# Patient Record
Sex: Female | Born: 1990
Health system: Southern US, Community
[De-identification: ages and names within clinical notes are randomized; demographics above are authoritative.]

## PROBLEM LIST (undated history)

## (undated) ENCOUNTER — Inpatient Hospital Stay: Payer: Self-pay

## (undated) DIAGNOSIS — N390 Urinary tract infection, site not specified: Secondary | ICD-10-CM

## (undated) DIAGNOSIS — J45909 Unspecified asthma, uncomplicated: Secondary | ICD-10-CM

## (undated) DIAGNOSIS — G43909 Migraine, unspecified, not intractable, without status migrainosus: Secondary | ICD-10-CM

## (undated) DIAGNOSIS — E079 Disorder of thyroid, unspecified: Secondary | ICD-10-CM

## (undated) DIAGNOSIS — L309 Dermatitis, unspecified: Secondary | ICD-10-CM

## (undated) DIAGNOSIS — Z62810 Personal history of physical and sexual abuse in childhood: Secondary | ICD-10-CM

## (undated) DIAGNOSIS — Q249 Congenital malformation of heart, unspecified: Secondary | ICD-10-CM

## (undated) DIAGNOSIS — B019 Varicella without complication: Secondary | ICD-10-CM

## (undated) DIAGNOSIS — R55 Syncope and collapse: Secondary | ICD-10-CM

## (undated) DIAGNOSIS — F32A Depression, unspecified: Secondary | ICD-10-CM

## (undated) DIAGNOSIS — R51 Headache: Secondary | ICD-10-CM

## (undated) DIAGNOSIS — F509 Eating disorder, unspecified: Secondary | ICD-10-CM

## (undated) DIAGNOSIS — F419 Anxiety disorder, unspecified: Secondary | ICD-10-CM

## (undated) DIAGNOSIS — R519 Headache, unspecified: Secondary | ICD-10-CM

## (undated) DIAGNOSIS — L509 Urticaria, unspecified: Secondary | ICD-10-CM

## (undated) DIAGNOSIS — F329 Major depressive disorder, single episode, unspecified: Secondary | ICD-10-CM

## (undated) HISTORY — DX: Urinary tract infection, site not specified: N39.0

## (undated) HISTORY — DX: Syncope and collapse: R55

## (undated) HISTORY — DX: Personal history of physical and sexual abuse in childhood: Z62.810

## (undated) HISTORY — DX: Varicella without complication: B01.9

## (undated) HISTORY — PX: TOTAL THYROIDECTOMY: SHX2547

## (undated) HISTORY — DX: Urticaria, unspecified: L50.9

## (undated) HISTORY — DX: Depression, unspecified: F32.A

## (undated) HISTORY — DX: Congenital malformation of heart, unspecified: Q24.9

## (undated) HISTORY — DX: Migraine, unspecified, not intractable, without status migrainosus: G43.909

## (undated) HISTORY — DX: Headache, unspecified: R51.9

## (undated) HISTORY — DX: Unspecified asthma, uncomplicated: J45.909

## (undated) HISTORY — DX: Disorder of thyroid, unspecified: E07.9

## (undated) HISTORY — DX: Dermatitis, unspecified: L30.9

## (undated) HISTORY — DX: Anxiety disorder, unspecified: F41.9

## (undated) HISTORY — DX: Headache: R51

## (undated) HISTORY — DX: Major depressive disorder, single episode, unspecified: F32.9

## (undated) HISTORY — DX: Eating disorder, unspecified: F50.9

---
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
## 2011-08-15 ENCOUNTER — Emergency Department: Payer: Self-pay | Admitting: Internal Medicine

## 2011-08-15 LAB — URINALYSIS, COMPLETE
Bacteria: NONE SEEN
Bilirubin,UR: NEGATIVE
Blood: NEGATIVE
Glucose,UR: NEGATIVE mg/dL (ref 0–75)
Ketone: NEGATIVE
Ph: 7 (ref 4.5–8.0)
Protein: NEGATIVE
RBC,UR: <1 /HPF (ref 0–5)
Specific Gravity: 1.006 (ref 1.003–1.030)
Squamous Epithelial: <1
WBC UR: <1 /HPF (ref 0–5)

## 2011-08-15 LAB — CBC WITH DIFFERENTIAL/PLATELET
Basophil #: 0 10*3/uL (ref 0.0–0.1)
Basophil %: 0.2 %
Eosinophil #: 0 10*3/uL (ref 0.0–0.7)
Eosinophil %: 0.2 %
HCT: 42.6 % (ref 35.0–47.0)
HGB: 14.4 g/dL (ref 12.0–16.0)
Lymphocyte #: 0.5 10*3/uL — ABNORMAL LOW (ref 1.0–3.6)
Lymphocyte %: 9.3 %
MCHC: 33.8 g/dL (ref 32.0–36.0)
Monocyte #: 0.1 10*3/uL (ref 0.0–0.7)
Monocyte %: 1.7 %
Neutrophil #: 4.7 10*3/uL (ref 1.4–6.5)
Neutrophil %: 88.6 %
Platelet: 219 10*3/uL (ref 150–440)
RBC: 4.57 10*6/uL (ref 3.80–5.20)
WBC: 5.3 10*3/uL (ref 3.6–11.0)

## 2011-08-15 LAB — COMPREHENSIVE METABOLIC PANEL
Albumin: 4 g/dL (ref 3.8–5.6)
Alkaline Phosphatase: 121 U/L (ref 82–169)
Anion Gap: 10 (ref 7–16)
BUN: 8 mg/dL (ref 7–18)
Bilirubin,Total: 0.3 mg/dL (ref 0.2–1.0)
Calcium, Total: 8.7 mg/dL — ABNORMAL LOW (ref 9.0–10.7)
Chloride: 107 mmol/L (ref 98–107)
Co2: 25 mmol/L (ref 21–32)
EGFR (African American): >60
Glucose: 109 mg/dL — ABNORMAL HIGH (ref 65–99)
Osmolality: 282 (ref 275–301)
SGPT (ALT): 30 U/L
Total Protein: 7.6 g/dL (ref 6.4–8.6)

## 2011-08-15 LAB — HCG, QUANTITATIVE, PREGNANCY: Beta Hcg, Quant.: <1 m[IU]/mL — ABNORMAL LOW

## 2011-08-15 LAB — LIPASE, BLOOD: Lipase: 119 U/L (ref 73–393)

## 2011-09-16 ENCOUNTER — Other Ambulatory Visit: Payer: Self-pay | Admitting: Physician Assistant

## 2011-09-16 LAB — HCG, QUANTITATIVE, PREGNANCY: Beta Hcg, Quant.: <1 m[IU]/mL — ABNORMAL LOW

## 2012-04-29 ENCOUNTER — Ambulatory Visit: Payer: Self-pay | Admitting: Family Medicine

## 2012-08-12 ENCOUNTER — Other Ambulatory Visit: Payer: Self-pay | Admitting: Physician Assistant

## 2012-08-12 LAB — CBC WITH DIFFERENTIAL/PLATELET
Basophil #: 0 10*3/uL (ref 0.0–0.1)
Basophil %: 0.4 %
Eosinophil #: 0.1 10*3/uL (ref 0.0–0.7)
Eosinophil %: 1 %
HCT: 41.5 % (ref 35.0–47.0)
HGB: 14 g/dL (ref 12.0–16.0)
Lymphocyte #: 0.6 10*3/uL — ABNORMAL LOW (ref 1.0–3.6)
Lymphocyte %: 10.9 %
MCH: 31.2 pg (ref 26.0–34.0)
MCHC: 33.7 g/dL (ref 32.0–36.0)
MCV: 93 fL (ref 80–100)
Monocyte #: 0.3 x10 3/mm (ref 0.2–0.9)
Monocyte %: 6 %
Neutrophil #: 4.7 10*3/uL (ref 1.4–6.5)
Neutrophil %: 81.7 %
Platelet: 205 10*3/uL (ref 150–440)
RBC: 4.48 10*6/uL (ref 3.80–5.20)
RDW: 12.7 % (ref 11.5–14.5)
WBC: 5.8 10*3/uL (ref 3.6–11.0)

## 2012-08-12 LAB — COMPREHENSIVE METABOLIC PANEL
Albumin: 4 g/dL (ref 3.4–5.0)
Alkaline Phosphatase: 92 U/L (ref 50–136)
Anion Gap: 5 — ABNORMAL LOW (ref 7–16)
BUN: 6 mg/dL — ABNORMAL LOW (ref 7–18)
Bilirubin,Total: 0.4 mg/dL (ref 0.2–1.0)
Calcium, Total: 8.7 mg/dL (ref 8.5–10.1)
Chloride: 109 mmol/L — ABNORMAL HIGH (ref 98–107)
Co2: 26 mmol/L (ref 21–32)
Creatinine: 0.79 mg/dL (ref 0.60–1.30)
EGFR (African American): >60
EGFR (Non-African Amer.): >60
Glucose: 117 mg/dL — ABNORMAL HIGH (ref 65–99)
Osmolality: 278 (ref 275–301)
Potassium: 3.9 mmol/L (ref 3.5–5.1)
SGOT(AST): 17 U/L (ref 15–37)
SGPT (ALT): 20 U/L (ref 12–78)
Sodium: 140 mmol/L (ref 136–145)
Total Protein: 7.1 g/dL (ref 6.4–8.2)

## 2012-08-12 LAB — HCG, QUANTITATIVE, PREGNANCY: Beta Hcg, Quant.: <1 m[IU]/mL — ABNORMAL LOW

## 2012-08-12 LAB — T4, FREE: Free Thyroxine: 0.99 ng/dL (ref 0.76–1.46)

## 2012-08-12 LAB — TSH: Thyroid Stimulating Horm: 2.39 u[IU]/mL

## 2012-09-10 ENCOUNTER — Encounter: Payer: Self-pay | Admitting: Adult Health

## 2012-09-10 ENCOUNTER — Ambulatory Visit (INDEPENDENT_AMBULATORY_CARE_PROVIDER_SITE_OTHER): Payer: 59 | Admitting: Adult Health

## 2012-09-10 VITALS — BP 127/87 | HR 103 | Temp 99.2°F | Resp 16 | Ht 63.0 in | Wt 119.0 lb

## 2012-09-10 DIAGNOSIS — L659 Nonscarring hair loss, unspecified: Secondary | ICD-10-CM | POA: Insufficient documentation

## 2012-09-10 DIAGNOSIS — F411 Generalized anxiety disorder: Secondary | ICD-10-CM

## 2012-09-10 DIAGNOSIS — R5381 Other malaise: Secondary | ICD-10-CM

## 2012-09-10 DIAGNOSIS — G43909 Migraine, unspecified, not intractable, without status migrainosus: Secondary | ICD-10-CM | POA: Insufficient documentation

## 2012-09-10 DIAGNOSIS — F32A Depression, unspecified: Secondary | ICD-10-CM | POA: Insufficient documentation

## 2012-09-10 DIAGNOSIS — R5383 Other fatigue: Secondary | ICD-10-CM | POA: Insufficient documentation

## 2012-09-10 DIAGNOSIS — Z0000 Encounter for general adult medical examination without abnormal findings: Secondary | ICD-10-CM | POA: Insufficient documentation

## 2012-09-10 DIAGNOSIS — F419 Anxiety disorder, unspecified: Secondary | ICD-10-CM

## 2012-09-10 NOTE — Patient Instructions (Addendum)
   Thank you for choosing [facility] for your health care needs.  Please have your labs drawn prior to leaving the office:   cbc w/ diff  TSH  cmet  Lipid profile  B12  One your results are available we will notify you.  Remember to activate your MyChart. The activation code is at the end of this form.

## 2012-09-10 NOTE — Progress Notes (Signed)
Subjective:    Patient ID: Susan Adkins, female    DOB: Nov 14, 1990, 22 y.o.   MRN: [id number]  HPI  Patient is a 22 year old female who presents to clinic to establish care. She was previously followed by Dr. Sherrie Mustache at [facility] family practice. Patient has a history of paroxysmal tachycardia with heart rate between 140 and 160. She reports that she has previously been evaluated for this; however, she is currently not under any treatment. She also has a history of migraine headaches for which she takes amitriptyline at bedtime and a history of anxiety. More recently, patient is concerned that she is losing a considerable amounts of hair.   Past Medical History  Diagnosis Date  . Asthma     Well controlled.   . Chicken pox   . Fainting episodes   . Migraines     Well controlled on amitriptyline   . UTI (lower urinary tract infection)     Past Surgical History  Procedure Laterality Date  . Tonsillectomy  2005    Family History  Problem Relation Age of Onset  . Interstitial cystitis Mother   . Depression Mother   . Anxiety disorder Mother   . Bipolar disorder Mother   . Hypertension Father   . Cancer Maternal Grandmother 2    Breast cancer  . Depression Maternal Grandmother   . Hypertension Paternal Grandmother      History   Social History  . Marital Status: Single    Spouse Name: Barron Schmid    Number of Children: 0  . Years of Education: 12   Occupational History  . [employer]    Social History Main Topics  . Smoking status: Never Smoker   . Smokeless tobacco: Never Used  . Alcohol Use: 0.5 oz/week    1 drink(s) per week  . Drug Use: No  . Sexually Active: Yes -- Female partner(s)   Other Topics Concern  . Not on file   Social History Narrative  . No narrative on file     Health Maintenance:  Tdap - 2012  Flu shot - 03/2012  PAP - 07/06/12 - Abnormal  Mammography - N/A  Colonoscopy - N/A  Labs - Needs: cbc w/diff, cmet,  TSH, lipids, b12  Depression Screen - No feelings of hopelessness, anhedonia  Tobacco Use - Never  Dental Exams - Every 6 months  Vision Exam - Yearly  Exercise - Run at least 3 days/week  Diet - Ovo Vegetarian    Review of Systems  Constitutional: Positive for appetite change and fatigue.       Decreased appetite  Eyes: Negative.   Respiratory: Negative.   Cardiovascular: Negative.   Gastrointestinal: Positive for abdominal pain and constipation. Negative for nausea, vomiting, diarrhea and blood in stool.       Bloating; Lower abdominal pain  Endocrine: Positive for cold intolerance and heat intolerance. Negative for polydipsia, polyphagia and polyuria.  Genitourinary: Positive for menstrual problem. Negative for dysuria, urgency, frequency, hematuria, difficulty urinating, genital sores and dyspareunia.       Past 6 months - 2 periods  Musculoskeletal: Negative.   Skin: Negative.   Allergic/Immunologic: Positive for environmental allergies.       Dust allergies  Neurological: Positive for dizziness, light-headedness and headaches. Negative for tremors, seizures, syncope, weakness and numbness.       Hx of migraines; lightheaded occasional with changing positions  Hematological: Negative for adenopathy. Does not bruise/bleed easily.  Psychiatric/Behavioral: Negative for suicidal ideas, behavioral  problems, sleep disturbance, self-injury and agitation. The patient is nervous/anxious.     BP 127/87  Pulse 103  Temp(Src) 99.2 F (37.3 C) (Oral)  Resp 16  Ht 5\' 3"  (1.6 m)  Wt 119 lb (53.978 kg)  BMI 21.09 kg/m2  SpO2 100%  LMP 08/15/2012     Objective:   Physical Exam  Constitutional: She is oriented to person, place, and time. She appears well-developed and well-nourished. No distress.  HENT:  Head: Normocephalic and atraumatic.  Right Ear: External ear normal.  Left Ear: External ear normal.  Nose: Nose normal.  Mouth/Throat: Oropharynx is clear and moist.   Eyes: Conjunctivae and EOM are normal. Pupils are equal, round, and reactive to light.  Neck: Normal range of motion. Neck supple. No tracheal deviation present. No thyromegaly present.  Cardiovascular: Regular rhythm, S1 normal, S2 normal and intact distal pulses.  Tachycardia present.  PMI is not displaced.  Exam reveals no gallop.   No murmur heard. Pulmonary/Chest: Effort normal and breath sounds normal. No respiratory distress. She has no wheezes. She has no rales. She exhibits no tenderness.  Abdominal: Soft. Bowel sounds are normal. She exhibits no distension and no mass. There is no tenderness. There is no rebound and no guarding.  Musculoskeletal: Normal range of motion. She exhibits no edema and no tenderness.  Lymphadenopathy:    She has no cervical adenopathy.  Neurological: She is alert and oriented to person, place, and time. She has normal reflexes. No cranial nerve deficit. Coordination normal.  Skin: Skin is warm and dry.  Unusual amounts of hair loss noted on her clothing.  Psychiatric: She has a normal mood and affect. Her behavior is normal. Judgment and thought content normal.       Assessment & Plan:

## 2012-09-11 ENCOUNTER — Encounter: Payer: Self-pay | Admitting: General Practice

## 2012-09-11 ENCOUNTER — Encounter: Payer: Self-pay | Admitting: Adult Health

## 2012-09-11 LAB — COMPREHENSIVE METABOLIC PANEL
ALT: 19 U/L (ref 0–35)
Chloride: 101 mEq/L (ref 96–112)
Creatinine, Ser: 0.8 mg/dL (ref 0.4–1.2)
GFR: 93.48 mL/min (ref >60.00)
Glucose, Bld: 83 mg/dL (ref 70–99)
Potassium: 3.8 mEq/L (ref 3.5–5.1)
Total Protein: 7.5 g/dL (ref 6.0–8.3)

## 2012-09-11 LAB — CBC WITH DIFFERENTIAL/PLATELET
Basophils Absolute: 0 10*3/uL (ref 0.0–0.1)
Eosinophils Relative: 1.3 % (ref 0.0–5.0)
Lymphocytes Relative: 28.4 % (ref 12.0–46.0)
Lymphs Abs: 1.6 10*3/uL (ref 0.7–4.0)
Neutro Abs: 3.6 10*3/uL (ref 1.4–7.7)
Platelets: 216 10*3/uL (ref 150.0–400.0)
RDW: 12.1 % (ref 11.5–14.6)
WBC: 5.5 10*3/uL (ref 4.5–10.5)

## 2012-09-11 LAB — LIPID PANEL
Cholesterol: 178 mg/dL (ref 0–200)
HDL: 82.4 mg/dL (ref >39.00)
LDL Cholesterol: 86 mg/dL (ref 0–99)

## 2012-09-11 NOTE — Assessment & Plan Note (Signed)
She has been having problems with fatigue. Patient is a vegetarian and reports good source of protein from tofu and beans. Patient is athletic and runs at least 3 times a week. I will check a TSH today.

## 2012-09-11 NOTE — Assessment & Plan Note (Signed)
Currently on amitriptyline at bedtime. She has been on this for "years". Patient reports that she is actively trying to get pregnant. Discussed that this is a category C medication. She reports that she has discussed this with her OB/GYN and they have determined to assess whether the benefits outweigh the risks when she becomes pregnant. Patient has tried to wean off of this medication but reports migraines are severe. Discussed that during pregnancy, people who suffer from migraine find that, HA may be suppressed. She will continue to discuss this further with her OB/GYN when she becomes pregnant.

## 2012-09-11 NOTE — Assessment & Plan Note (Signed)
This appears to be an ongoing problem for patient. She reports considerable amounts of stress. Reports that she will break out in a rash spontaneously when she is stressed. Her stress and anxiety could be contributing, not only to her hair loss but, also to her paroxysmal tachycardia.

## 2012-09-11 NOTE — Assessment & Plan Note (Signed)
Physical exam was normal. I will check routine labs today: cbc with differential, cmet, TSH, lipid panel. I am checking a B12 level since patient patient reports being a vegetarian.

## 2012-09-11 NOTE — Assessment & Plan Note (Signed)
Uncertain what the source of her hair loss may be. Could be related to increased stress and anxiety which she reports affect her daily. Her diet may be a factor if she is not getting enough protein. However, she reports good source of protein from tofu and beans. I will check a TSH today.

## 2013-08-09 ENCOUNTER — Inpatient Hospital Stay: Payer: Self-pay

## 2013-08-09 LAB — CBC WITH DIFFERENTIAL/PLATELET
Basophil #: 0 10*3/uL (ref 0.0–0.1)
Basophil %: 0.3 %
Eosinophil #: 0 10*3/uL (ref 0.0–0.7)
Eosinophil %: 0.3 %
HCT: 36 % (ref 35.0–47.0)
HGB: 12.2 g/dL (ref 12.0–16.0)
Lymphocyte #: 1.4 10*3/uL (ref 1.0–3.6)
Lymphocyte %: 15.9 %
MCH: 29.9 pg (ref 26.0–34.0)
MCHC: 33.8 g/dL (ref 32.0–36.0)
MCV: 88 fL (ref 80–100)
Monocyte #: 0.5 x10 3/mm (ref 0.2–0.9)
Monocyte %: 5.2 %
Neutrophil #: 6.8 10*3/uL — ABNORMAL HIGH (ref 1.4–6.5)
Neutrophil %: 78.3 %
Platelet: 234 10*3/uL (ref 150–440)
RBC: 4.08 10*6/uL (ref 3.80–5.20)
RDW: 12.9 % (ref 11.5–14.5)
WBC: 8.7 10*3/uL (ref 3.6–11.0)

## 2013-08-09 LAB — GC/CHLAMYDIA PROBE AMP

## 2013-08-11 LAB — HEMATOCRIT: HCT: 31.3 % — ABNORMAL LOW (ref 35.0–47.0)

## 2014-10-25 NOTE — H&P (Signed)
L&D Evaluation:  History:  HPI 24yo MWF presents to L&D for IOL due to postterm at 8381w3d, normal PNC to date with Reactive NST. Female fetus.   Patient's Medical History other  Migraine,   Patient's Surgical History Tonsilectomy, IAB at age 24   Medications Pre [place]atal Vitamins  Tylenol (Acetaminophen)  Omnicef, tessalon pearls   Allergies NKDA   Social History none   Family History Non-Contributory   ROS:  ROS All systems were reviewed.  HEENT, CNS, GI, GU, Respiratory, CV, Renal and Musculoskeletal systems were found to be normal.   Exam:  Vital Signs stable   General no apparent distress   Mental Status clear   Chest clear   Heart normal sinus rhythm   Abdomen gravid, non-tender   Estimated Fetal Weight Average for gestational age   Fetal Position vtx   Edema no edema   Pelvic 1/50/-1   Mebranes Intact   Description clear   FHT normal rate with no decels   Fetal Heart Rate 125   Ucx irregular   Length of each Contraction 60 seconds   Impression:  Impression IOL at 9581w3d   Plan:  Plan EFM/NST, monitor contractions and for cervical change, cytotec IOL   Electronic Signatures: Yolanda BonineBurr, Adriel Desrosier N (CNM)  (Signed 23-Feb-15 10:15)  Authored: L&D Evaluation   Last Updated: 23-Feb-15 10:15 by Ulyses AmorBurr, Kymere Fullington N (CNM)

## 2015-06-18 NOTE — L&D Delivery Note (Signed)
Delivery Note At  2153 a viable and healthy female was delivered via  (Presentation:LOA ;  ).  APGAR: 8,9 ; weight  .   Placenta status: delivered intact with 3 vessel Cord:  with the following complications:  x2-reduced on perineum  Anesthesia:  epidural Episiotomy:  none Lacerations:  none Suture Repair: NA Est. Blood Loss (mL):  200  Mom to postpartum.  Baby to Couplet care / Skin to Skin.  Melody NIKE Shambley, CNM 02/02/2016, 10:06 PM

## 2015-06-23 ENCOUNTER — Ambulatory Visit (INDEPENDENT_AMBULATORY_CARE_PROVIDER_SITE_OTHER): Payer: BLUE CROSS/BLUE SHIELD | Admitting: Obstetrics and Gynecology

## 2015-06-23 ENCOUNTER — Ambulatory Visit (INDEPENDENT_AMBULATORY_CARE_PROVIDER_SITE_OTHER): Payer: BLUE CROSS/BLUE SHIELD

## 2015-06-23 ENCOUNTER — Encounter: Payer: Self-pay | Admitting: Obstetrics and Gynecology

## 2015-06-23 VITALS — BP 124/80 | HR 100 | Ht 62.0 in | Wt 127.8 lb

## 2015-06-23 DIAGNOSIS — N926 Irregular menstruation, unspecified: Secondary | ICD-10-CM

## 2015-06-23 LAB — POCT URINE PREGNANCY: Preg Test, Ur: POSITIVE — AB

## 2015-06-23 NOTE — Progress Notes (Signed)
Subjective:     Patient ID: Susan Adkins, female   DOB: 08/08/1990, 25 y.o.   MRN: [id number]  HPI Unsure LMP- sometime November before thanksgiving G2P1001  Review of Systems No complaints    Objective:   Physical Exam A & O x4 well groomed female in no distress U/S today reveals:  Indications: Dating for unsure LMP Findings:  Singleton intrauterine pregnancy is visualized with a CRL consistent with [redacted] weeks gestation, giving an (U/S) EDD of 02/16/16.   FHR: 107 bpm, low, but likely due to early gestational age.  CRL measurement: 3.2 mm Yolk sac appears WNL.  Right Ovary measures 3.3 x 1.1 x 1.6  cm. It is normal in appearance. Left Ovary measures 4.1 x 2.6 x 3.0 cm. It is normal appearance. There is evidence of a corpus luteal cyst in the left ovary measuring 2.2 x 1.8 x 2.4 cm. Survey of the adnexa demonstrates no adnexal masses. There is no free peritoneal fluid in the cul de sac.       Assessment:       Impression: 1. 6 week Viable Singleton Intrauterine pregnancy by U/S. 2. (U/S) EDD is 02/16/16. 3. Low fetal heart rate. Likely due to early gestational age. Suggest repeat ultrasound in 2 weeks to reassess    Plan:     Repeat viability scan in 2 weeks NOB labs drawn today.  Susan Adkins, CNM

## 2015-06-23 NOTE — Patient Instructions (Signed)
First Trimester of Pregnancy The first trimester of pregnancy is from week 1 until the end of week 12 (months 1 through 3). A week after a sperm fertilizes an egg, the egg will implant on the wall of the uterus. This embryo will begin to develop into a baby. Genes from you and your partner are forming the baby. The female genes determine whether the baby is a boy or a girl. At 6-8 weeks, the eyes and face are formed, and the heartbeat can be seen on ultrasound. At the end of 12 weeks, all the baby's organs are formed.  Now that you are pregnant, you will want to do everything you can to have a healthy baby. Two of the most important things are to get good prenatal care and to follow your health care provider's instructions. Prenatal care is all the medical care you receive before the baby's birth. This care will help prevent, find, and treat any problems during the pregnancy and childbirth. BODY CHANGES Your body goes through many changes during pregnancy. The changes vary from woman to woman.   You may gain or lose a couple of pounds at first.  You may feel sick to your stomach (nauseous) and throw up (vomit). If the vomiting is uncontrollable, call your health care provider.  You may tire easily.  You may develop headaches that can be relieved by medicines approved by your health care provider.  You may urinate more often. Painful urination may mean you have a bladder infection.  You may develop heartburn as a result of your pregnancy.  You may develop constipation because certain hormones are causing the muscles that push waste through your intestines to slow down.  You may develop hemorrhoids or swollen, bulging veins (varicose veins).  Your breasts may begin to grow larger and become tender. Your nipples may stick out more, and the tissue that surrounds them (areola) may become darker.  Your gums may bleed and may be sensitive to brushing and flossing.  Dark spots or blotches (chloasma,  mask of pregnancy) may develop on your face. This will likely fade after the baby is born.  Your menstrual periods will stop.  You may have a loss of appetite.  You may develop cravings for certain kinds of food.  You may have changes in your emotions from day to day, such as being excited to be pregnant or being concerned that something may go wrong with the pregnancy and baby.  You may have more vivid and strange dreams.  You may have changes in your hair. These can include thickening of your hair, rapid growth, and changes in texture. Some women also have hair loss during or after pregnancy, or hair that feels dry or thin. Your hair will most likely return to normal after your baby is born. WHAT TO EXPECT AT YOUR PRENATAL VISITS During a routine prenatal visit:  You will be weighed to make sure you and the baby are growing normally.  Your blood pressure will be taken.  Your abdomen will be measured to track your baby's growth.  The fetal heartbeat will be listened to starting around week 10 or 12 of your pregnancy.  Test results from any previous visits will be discussed. Your health care provider may ask you:  How you are feeling.  If you are feeling the baby move.  If you have had any abnormal symptoms, such as leaking fluid, bleeding, severe headaches, or abdominal cramping.  If you are using any tobacco products,   including cigarettes, chewing tobacco, and electronic cigarettes.  If you have any questions. Other tests that may be performed during your first trimester include:  Blood tests to find your blood type and to check for the presence of any previous infections. They will also be used to check for low iron levels (anemia) and Rh antibodies. Later in the pregnancy, blood tests for diabetes will be done along with other tests if problems develop.  Urine tests to check for infections, diabetes, or protein in the urine.  An ultrasound to confirm the proper growth  and development of the baby.  An amniocentesis to check for possible genetic problems.  Fetal screens for spina bifida and Down syndrome.  You may need other tests to make sure you and the baby are doing well.  HIV (human immunodeficiency virus) testing. Routine prenatal testing includes screening for HIV, unless you choose not to have this test. HOME CARE INSTRUCTIONS  Medicines  Follow your health care provider's instructions regarding medicine use. Specific medicines may be either safe or unsafe to take during pregnancy.  Take your prenatal vitamins as directed.  If you develop constipation, try taking a stool softener if your health care provider approves. Diet  Eat regular, well-balanced meals. Choose a variety of foods, such as meat or vegetable-based protein, fish, milk and low-fat dairy products, vegetables, fruits, and whole grain breads and cereals. Your health care provider will help you determine the amount of weight gain that is right for you.  Avoid raw meat and uncooked cheese. These carry germs that can cause birth defects in the baby.  Eating four or five small meals rather than three large meals a day may help relieve nausea and vomiting. If you start to feel nauseous, eating a few soda crackers can be helpful. Drinking liquids between meals instead of during meals also seems to help nausea and vomiting.  If you develop constipation, eat more high-fiber foods, such as fresh vegetables or fruit and whole grains. Drink enough fluids to keep your urine clear or pale yellow. Activity and Exercise  Exercise only as directed by your health care provider. Exercising will help you:  Control your weight.  Stay in shape.  Be prepared for labor and delivery.  Experiencing pain or cramping in the lower abdomen or low back is a good sign that you should stop exercising. Check with your health care provider before continuing normal exercises.  Try to avoid standing for long  periods of time. Move your legs often if you must stand in one place for a long time.  Avoid heavy lifting.  Wear low-heeled shoes, and practice good posture.  You may continue to have sex unless your health care provider directs you otherwise. Relief of Pain or Discomfort  Wear a good support bra for breast tenderness.   Take warm sitz baths to soothe any pain or discomfort caused by hemorrhoids. Use hemorrhoid cream if your health care provider approves.   Rest with your legs elevated if you have leg cramps or low back pain.  If you develop varicose veins in your legs, wear support hose. Elevate your feet for 15 minutes, 3-4 times a day. Limit salt in your diet. Prenatal Care  Schedule your prenatal visits by the twelfth week of pregnancy. They are usually scheduled monthly at first, then more often in the last 2 months before delivery.  Write down your questions. Take them to your prenatal visits.  Keep all your prenatal visits as directed by your   health care provider. Safety  Wear your seat belt at all times when driving.  Make a list of emergency phone numbers, including numbers for family, friends, the hospital, and police and fire departments. General Tips  Ask your health care provider for a referral to a local prenatal education class. Begin classes no later than at the beginning of month 6 of your pregnancy.  Ask for help if you have counseling or nutritional needs during pregnancy. Your health care provider can offer advice or refer you to specialists for help with various needs.  Do not use hot tubs, steam rooms, or saunas.  Do not douche or use tampons or scented sanitary pads.  Do not cross your legs for long periods of time.  Avoid cat litter boxes and soil used by cats. These carry germs that can cause birth defects in the baby and possibly loss of the fetus by miscarriage or stillbirth.  Avoid all smoking, herbs, alcohol, and medicines not prescribed by  your health care provider. Chemicals in these affect the formation and growth of the baby.  Do not use any tobacco products, including cigarettes, chewing tobacco, and electronic cigarettes. If you need help quitting, ask your health care provider. You may receive counseling support and other resources to help you quit.  Schedule a dentist appointment. At home, brush your teeth with a soft toothbrush and be gentle when you floss. SEEK MEDICAL CARE IF:   You have dizziness.  You have mild pelvic cramps, pelvic pressure, or nagging pain in the abdominal area.  You have persistent nausea, vomiting, or diarrhea.  You have a bad smelling vaginal discharge.  You have pain with urination.  You notice increased swelling in your face, hands, legs, or ankles. SEEK IMMEDIATE MEDICAL CARE IF:   You have a fever.  You are leaking fluid from your vagina.  You have spotting or bleeding from your vagina.  You have severe abdominal cramping or pain.  You have rapid weight gain or loss.  You vomit blood or material that looks like coffee grounds.  You are exposed to German measles and have never had them.  You are exposed to fifth disease or chickenpox.  You develop a severe headache.  You have shortness of breath.  You have any kind of trauma, such as from a fall or a car accident.   This information is not intended to replace advice given to you by your health care provider. Make sure you discuss any questions you have with your health care provider.   Document Released: 05/28/2001 Document Revised: 06/24/2014 Document Reviewed: 04/13/2013 Elsevier Interactive Patient Education 2016 Elsevier Inc.  

## 2015-06-24 LAB — RPR: RPR: NONREACTIVE

## 2015-06-24 LAB — CBC
Hematocrit: 41.3 % (ref 34.0–46.6)
Hemoglobin: 14.3 g/dL (ref 11.1–15.9)
MCH: 31.5 pg (ref 26.6–33.0)
MCHC: 34.6 g/dL (ref 31.5–35.7)
MCV: 91 fL (ref 79–97)
PLATELETS: 232 10*3/uL (ref 150–379)
RBC: 4.54 x10E6/uL (ref 3.77–5.28)
RDW: 12.3 % (ref 12.3–15.4)
WBC: 6.1 10*3/uL (ref 3.4–10.8)

## 2015-06-24 LAB — ANTIBODY SCREEN: Antibody Screen: NEGATIVE

## 2015-06-24 LAB — HIV ANTIBODY (ROUTINE TESTING W REFLEX): HIV SCREEN 4TH GENERATION: NONREACTIVE

## 2015-06-24 LAB — RH TYPE: Rh Factor: POSITIVE

## 2015-06-24 LAB — HEPATITIS B SURFACE ANTIGEN: HEP B S AG: NEGATIVE

## 2015-06-24 LAB — ABO

## 2015-06-25 LAB — VARICELLA ZOSTER ANTIBODY, IGG: VARICELLA: 292 {index} (ref >165)

## 2015-06-25 LAB — URINE CULTURE

## 2015-06-25 LAB — RUBELLA SCREEN: RUBELLA: 2.61 {index} (ref >0.99)

## 2015-06-25 LAB — RUBELLA ANTIBODY, IGM: Rubella IgM: <20 AU/mL (ref 0.0–19.9)

## 2015-06-27 LAB — GC/CHLAMYDIA PROBE AMP
Chlamydia trachomatis, NAA: NEGATIVE
Neisseria gonorrhoeae by PCR: NEGATIVE

## 2015-06-29 ENCOUNTER — Other Ambulatory Visit: Payer: Self-pay | Admitting: Obstetrics and Gynecology

## 2015-06-29 DIAGNOSIS — Z2839 Other underimmunization status: Secondary | ICD-10-CM

## 2015-06-29 DIAGNOSIS — O9989 Other specified diseases and conditions complicating pregnancy, childbirth and the puerperium: Principal | ICD-10-CM

## 2015-06-29 DIAGNOSIS — O09899 Supervision of other high risk pregnancies, unspecified trimester: Secondary | ICD-10-CM | POA: Insufficient documentation

## 2015-06-29 DIAGNOSIS — Z283 Underimmunization status: Secondary | ICD-10-CM | POA: Insufficient documentation

## 2015-06-30 ENCOUNTER — Other Ambulatory Visit: Payer: Self-pay | Admitting: Obstetrics and Gynecology

## 2015-06-30 DIAGNOSIS — O3680X0 Pregnancy with inconclusive fetal viability, not applicable or unspecified: Secondary | ICD-10-CM

## 2015-07-04 ENCOUNTER — Other Ambulatory Visit: Payer: Self-pay | Admitting: *Deleted

## 2015-07-04 ENCOUNTER — Telehealth: Payer: Self-pay | Admitting: Obstetrics and Gynecology

## 2015-07-04 MED ORDER — DOXYLAMINE-PYRIDOXINE 10-10 MG PO TBEC: 1.0000 | DELAYED_RELEASE_TABLET | Freq: Two times a day (BID) | ORAL | Status: DC

## 2015-07-04 NOTE — Telephone Encounter (Signed)
Medication was sent in   

## 2015-07-04 NOTE — Telephone Encounter (Signed)
Pt is [redacted] wk pregnant and has morn sickness, last time she was on diclegis, can you send to pharmacy  Susan Adkins Susan Adkins S.Susan Adkins) pt wants a cll to know that you sent

## 2015-07-07 ENCOUNTER — Other Ambulatory Visit: Payer: Self-pay | Admitting: Obstetrics and Gynecology

## 2015-07-07 ENCOUNTER — Ambulatory Visit (INDEPENDENT_AMBULATORY_CARE_PROVIDER_SITE_OTHER): Payer: BLUE CROSS/BLUE SHIELD

## 2015-07-07 DIAGNOSIS — O3680X0 Pregnancy with inconclusive fetal viability, not applicable or unspecified: Secondary | ICD-10-CM

## 2015-07-18 ENCOUNTER — Ambulatory Visit (INDEPENDENT_AMBULATORY_CARE_PROVIDER_SITE_OTHER): Payer: BLUE CROSS/BLUE SHIELD | Admitting: Obstetrics and Gynecology

## 2015-07-18 ENCOUNTER — Encounter: Payer: Self-pay | Admitting: Obstetrics and Gynecology

## 2015-07-18 VITALS — BP 126/82 | HR 84 | Wt 127.9 lb

## 2015-07-18 DIAGNOSIS — Z331 Pregnant state, incidental: Secondary | ICD-10-CM

## 2015-07-18 DIAGNOSIS — O209 Hemorrhage in early pregnancy, unspecified: Secondary | ICD-10-CM

## 2015-07-18 LAB — POCT URINALYSIS DIPSTICK
BILIRUBIN UA: NEGATIVE
GLUCOSE UA: NEGATIVE
KETONES UA: NEGATIVE
LEUKOCYTES UA: NEGATIVE
Nitrite, UA: NEGATIVE
Protein, UA: NEGATIVE
SPEC GRAV UA: 1.01
Urobilinogen, UA: 0.2
pH, UA: 6

## 2015-07-18 NOTE — Patient Instructions (Signed)

## 2015-07-18 NOTE — Progress Notes (Signed)
OB WORK IN- pt had episode of vaginal bleeding x 2 nights, old brown blood, had some cramping

## 2015-07-18 NOTE — Progress Notes (Signed)
OB work-in- reports lower pelvic cramping and dark spotting- seeing dark brown cervical discharge on exam today, UA clear; uterus soft and no-tender. Reassured of probable implantation bleeding.

## 2015-08-04 ENCOUNTER — Ambulatory Visit (INDEPENDENT_AMBULATORY_CARE_PROVIDER_SITE_OTHER): Payer: BLUE CROSS/BLUE SHIELD | Admitting: Obstetrics and Gynecology

## 2015-08-04 VITALS — BP 119/80 | HR 80 | Wt 132.5 lb

## 2015-08-04 DIAGNOSIS — Z3481 Encounter for supervision of other normal pregnancy, first trimester: Secondary | ICD-10-CM

## 2015-08-04 DIAGNOSIS — Z3491 Encounter for supervision of normal pregnancy, unspecified, first trimester: Secondary | ICD-10-CM

## 2015-08-04 LAB — POCT URINALYSIS DIPSTICK
Bilirubin, UA: NEGATIVE
Glucose, UA: NEGATIVE
KETONES UA: NEGATIVE
Leukocytes, UA: NEGATIVE
Nitrite, UA: NEGATIVE
PH UA: 6.5
PROTEIN UA: NEGATIVE
RBC UA: NEGATIVE
SPEC GRAV UA: 1.015
UROBILINOGEN UA: NEGATIVE

## 2015-08-04 NOTE — Progress Notes (Signed)
NEW OB HISTORY AND PHYSICAL  SUBJECTIVE:       Susan Adkins is a 25 y.o. G35P1001 female, Patient's last menstrual period was 05/12/2015., Estimated Date of Delivery: 02/16/16, [redacted]w[redacted]d, presents today for establishment of Prenatal Care. She has no unusual complaints and complains of Nausea resolved with diclegis      Gynecologic History Patient's last menstrual period was 05/12/2015. Normal Contraception: none Last Pap: 2015. Results were: normal  Obstetric History OB History  Gravida Para Term Preterm AB SAB TAB Ectopic Multiple Living  # Outcome Date GA Lbr Len/2nd Weight Sex Delivery Anes PTL Lv  2 Current           1 Term 2015   7 lb 6 oz (3.345 kg) M Vag-Spont EPI N Y      Past Medical History  Diagnosis Date  . Asthma     Well controlled.   . Chicken pox   . Fainting episodes   . Migraines     Well controlled on amitriptyline   . UTI (lower urinary tract infection)     Past Surgical History  Procedure Laterality Date  . Tonsillectomy  2005    Current Outpatient Prescriptions on File Prior to Visit  Medication Sig Dispense Refill  . Doxylamine-Pyridoxine 10-10 MG TBEC Take 1 tablet by mouth 2 (two) times daily. 60 tablet 1  . PRENATAL VITAMINS PO Take by mouth.     No current facility-administered medications on file prior to visit.    No Known Allergies  Social History   Social History  . Marital Status: Married    Spouse Name: Barron Schmid  . Number of Children: 0  . Years of Education: 12   Occupational History  . [employer]    Social History Main Topics  . Smoking status: Never Smoker   . Smokeless tobacco: Never Used  . Alcohol Use: 0.5 oz/week    1 Standard drinks or equivalent per week  . Drug Use: No  . Sexual Activity:    Partners: Male   Other Topics Concern  . Not on file   Social History Narrative    Family History  Problem Relation Age of Onset  . Interstitial cystitis Mother   . Depression  Mother   . Anxiety disorder Mother   . Bipolar disorder Mother   . Hypertension Father   . Cancer Maternal Grandmother 64    Breast cancer  . Depression Maternal Grandmother   . Hypertension Paternal Grandmother     The following portions of the patient's history were reviewed and updated as appropriate: allergies, current medications, past OB history, past medical history, past surgical history, past family history, past social history, and problem list.    OBJECTIVE: Initial Physical Exam (New OB)  GENERAL APPEARANCE: alert, well appearing, in no apparent distress, oriented to person, place and time HEAD: normocephalic, atraumatic MOUTH: mucous membranes moist, pharynx normal without lesions THYROID: no thyromegaly or masses present BREASTS: not examined LUNGS: clear to auscultation, no wheezes, rales or rhonchi, symmetric air entry HEART: regular rate and rhythm, no murmurs ABDOMEN: soft, nontender, nondistended, no abnormal masses, no epigastric pain, fundus not palpable and FHT present EXTREMITIES: no redness or tenderness in the calves or thighs SKIN: normal coloration and turgor, no rashes LYMPH NODES: no adenopathy palpable NEUROLOGIC: alert, oriented, normal speech, no focal findings or movement disorder noted  PELVIC EXAM EXTERNAL GENITALIA: normal appearing vulva  with no masses, tenderness or lesions CERVIX: no lesions or cervical motion tenderness  ASSESSMENT: Normal pregnancy nausea  PLAN: Prenatal care Desires genetic screening See orders

## 2015-08-04 NOTE — Patient Instructions (Signed)

## 2015-08-14 ENCOUNTER — Encounter: Payer: Self-pay | Admitting: Obstetrics and Gynecology

## 2015-08-14 ENCOUNTER — Other Ambulatory Visit: Payer: Self-pay | Admitting: Obstetrics and Gynecology

## 2015-08-14 ENCOUNTER — Telehealth: Payer: Self-pay | Admitting: *Deleted

## 2015-08-14 NOTE — Telephone Encounter (Signed)
Notified pt of results 

## 2015-08-14 NOTE — Telephone Encounter (Signed)
-----   Message from Purcell Nails, [place] sent at 08/14/2015  9:31 AM EST ----- Let message to call for results, low risks, female

## 2015-08-16 ENCOUNTER — Other Ambulatory Visit: Payer: Self-pay | Admitting: Obstetrics and Gynecology

## 2015-08-16 ENCOUNTER — Telehealth: Payer: Self-pay | Admitting: Obstetrics and Gynecology

## 2015-08-16 MED ORDER — CEFDINIR 300 MG PO CAPS: 300.0000 mg | ORAL_CAPSULE | Freq: Two times a day (BID) | ORAL | Status: DC

## 2015-08-16 NOTE — Telephone Encounter (Signed)
Notified pt. 

## 2015-08-16 NOTE — Telephone Encounter (Signed)
I sent in and antibiotic to [facility], also have her take tylenol cold and sinus for next 2 days

## 2015-08-16 NOTE — Telephone Encounter (Signed)
Hey what do you suggest???

## 2015-08-16 NOTE — Telephone Encounter (Signed)
Bad cold, congestion, coughing, fever yesterday 101, headache for 4 days, please call!!!

## 2015-08-31 ENCOUNTER — Encounter: Payer: Self-pay | Admitting: Obstetrics and Gynecology

## 2015-08-31 ENCOUNTER — Ambulatory Visit (INDEPENDENT_AMBULATORY_CARE_PROVIDER_SITE_OTHER): Payer: BLUE CROSS/BLUE SHIELD | Admitting: Obstetrics and Gynecology

## 2015-08-31 VITALS — BP 126/81 | HR 81 | Wt 136.3 lb

## 2015-08-31 DIAGNOSIS — Z331 Pregnant state, incidental: Secondary | ICD-10-CM

## 2015-08-31 LAB — POCT URINALYSIS DIPSTICK
Bilirubin, UA: NEGATIVE
Blood, UA: NEGATIVE
Glucose, UA: NEGATIVE
KETONES UA: NEGATIVE
NITRITE UA: NEGATIVE
PH UA: 6.5
PROTEIN UA: NEGATIVE
Spec Grav, UA: 1.01
UROBILINOGEN UA: 0.2

## 2015-08-31 NOTE — Progress Notes (Signed)
ROB- anatomy next visit.

## 2015-08-31 NOTE — Progress Notes (Signed)
ROB-pt denies any complaints 

## 2015-09-18 ENCOUNTER — Encounter: Payer: Self-pay | Admitting: Obstetrics and Gynecology

## 2015-09-19 ENCOUNTER — Encounter: Payer: Self-pay | Admitting: Obstetrics and Gynecology

## 2015-09-19 ENCOUNTER — Ambulatory Visit (INDEPENDENT_AMBULATORY_CARE_PROVIDER_SITE_OTHER): Payer: BLUE CROSS/BLUE SHIELD | Admitting: Obstetrics and Gynecology

## 2015-09-19 VITALS — BP 105/71 | HR 101 | Temp 98.4°F | Wt 138.9 lb

## 2015-09-19 DIAGNOSIS — R300 Dysuria: Secondary | ICD-10-CM

## 2015-09-19 DIAGNOSIS — R591 Generalized enlarged lymph nodes: Secondary | ICD-10-CM

## 2015-09-19 DIAGNOSIS — R599 Enlarged lymph nodes, unspecified: Secondary | ICD-10-CM

## 2015-09-19 DIAGNOSIS — R0981 Nasal congestion: Secondary | ICD-10-CM | POA: Diagnosis not present

## 2015-09-19 LAB — POCT URINALYSIS DIPSTICK
BILIRUBIN UA: 1
Blood, UA: NEGATIVE
Glucose, UA: NEGATIVE
Ketones, UA: NEGATIVE
NITRITE UA: NEGATIVE
PH UA: 7.5
Protein, UA: NEGATIVE
Spec Grav, UA: 1.01
Urobilinogen, UA: 0.2

## 2015-09-19 NOTE — Progress Notes (Signed)
Ob problem visit- swollen behind left ear- tender and sore x 3-4 days. Stuffy nose. Some congestion. NO fevers. No sob. No cough. No significant rhinorrhea Physical exam: Well-appearing female in no acute distress. HEENT exam: Normocephalic/atraumatic; neck: Normal range of motion; lymph node survey: 5 mm left occipital node, barely palpable, slightly tender; No supraclavicular, anterior or posterior cervical chain adenopathy; no submandibular adenopathy. Oropharynx: Clear. Nasal turbinates normal. Tympanic membranes normal bilaterally; no external canal tenderness appreciated. No significant frontal maxillary sinus tenderness Impression: Isolated left suboccipital movement noted without other isolated findings except complaints of mild congestion. Plan: 1. Increase fluid intake.  2. Tylenol, Sudafed, Benadryl when necessary. 3. Return for appointment as scheduled or sooner if symptoms worsen

## 2015-09-19 NOTE — Patient Instructions (Signed)
1. Tylenol as needed 2. Sudafed for congestion as needed 3. Benadryl or other antihistamine as needed 4. Return if lymph node enlargement occurs or symptoms worsen

## 2015-09-22 ENCOUNTER — Other Ambulatory Visit: Payer: Self-pay | Admitting: *Deleted

## 2015-09-22 ENCOUNTER — Other Ambulatory Visit: Payer: Self-pay | Admitting: Obstetrics and Gynecology

## 2015-09-22 MED ORDER — DOXYLAMINE-PYRIDOXINE 10-10 MG PO TBEC: 1.0000 | DELAYED_RELEASE_TABLET | Freq: Two times a day (BID) | ORAL | Status: DC

## 2015-09-27 ENCOUNTER — Ambulatory Visit (INDEPENDENT_AMBULATORY_CARE_PROVIDER_SITE_OTHER): Payer: BLUE CROSS/BLUE SHIELD

## 2015-09-27 ENCOUNTER — Encounter: Payer: Self-pay | Admitting: Obstetrics and Gynecology

## 2015-09-27 ENCOUNTER — Ambulatory Visit (INDEPENDENT_AMBULATORY_CARE_PROVIDER_SITE_OTHER): Payer: BLUE CROSS/BLUE SHIELD | Admitting: Obstetrics and Gynecology

## 2015-09-27 VITALS — BP 124/77 | HR 88 | Wt 140.6 lb

## 2015-09-27 DIAGNOSIS — Z331 Pregnant state, incidental: Secondary | ICD-10-CM

## 2015-09-27 LAB — POCT URINALYSIS DIPSTICK
BILIRUBIN UA: NEGATIVE
Blood, UA: NEGATIVE
Glucose, UA: NEGATIVE
KETONES UA: NEGATIVE
Leukocytes, UA: NEGATIVE
Nitrite, UA: NEGATIVE
PH UA: 6.5
PROTEIN UA: NEGATIVE
Urobilinogen, UA: 0.2

## 2015-09-27 NOTE — Progress Notes (Signed)
Indications:  Anatomy Findings:  Singleton intrauterine pregnancy is visualized with FHR at 143 BPM. Biometrics give an (U/S) Gestational age of [redacted] weeks 5 days, and an (U/S) EDD of 02/16/16; this correlates with the clinically established EDD of 02/16/16.  Fetal presentation is breech, spine variable.  EFW: 310 grams ( 0 lbs. 11 oz. ).  Placenta: Anterior, grade 0 and remote to cervix by > 5.0 cm.  AFI: Adequate with a MVP of 3.8 cm.  Anatomic survey is complete and appears WNL. Gender - Female.   Right Ovary measures 3.3 x 1.6 x 2.1 cm, and appears WNL. Left Ovary measures 2.4 x 1.9 x 1.8 cm, and appears WNL. There is no evidence of a corpus luteal cyst. Survey of the adnexa demonstrates no adnexal masses. There is no free peritoneal fluid in the cul de sac.  Impression: 1. 19 week 5 day Viable Singleton Intrauterine pregnancy by U/S. 2. (U/S) EDD is consistent with Clinically established (LMP) EDD of 02/16/16. 3. Normal appearing anatomy scan.

## 2015-09-27 NOTE — Progress Notes (Signed)
ROB- pt is having headaches qd, tylenol doesn't help at all

## 2015-10-23 ENCOUNTER — Other Ambulatory Visit: Payer: Self-pay | Admitting: Obstetrics and Gynecology

## 2015-11-03 ENCOUNTER — Ambulatory Visit (INDEPENDENT_AMBULATORY_CARE_PROVIDER_SITE_OTHER): Payer: Medicaid Other | Admitting: Obstetrics and Gynecology

## 2015-11-03 ENCOUNTER — Encounter: Payer: Self-pay | Admitting: Obstetrics and Gynecology

## 2015-11-03 VITALS — BP 115/77 | HR 87 | Wt 148.2 lb

## 2015-11-03 DIAGNOSIS — Z3493 Encounter for supervision of normal pregnancy, unspecified, third trimester: Secondary | ICD-10-CM

## 2015-11-03 DIAGNOSIS — Z3481 Encounter for supervision of other normal pregnancy, first trimester: Secondary | ICD-10-CM | POA: Diagnosis not present

## 2015-11-03 LAB — POCT URINALYSIS DIPSTICK
Bilirubin, UA: NEGATIVE
GLUCOSE UA: NEGATIVE
Ketones, UA: NEGATIVE
Leukocytes, UA: NEGATIVE
NITRITE UA: NEGATIVE
PROTEIN UA: NEGATIVE
RBC UA: NEGATIVE
Spec Grav, UA: 1.01
UROBILINOGEN UA: 0.2
pH, UA: 6.5

## 2015-11-03 NOTE — Progress Notes (Signed)
ROB- pt c/o fatigue, no energy

## 2015-11-03 NOTE — Patient Instructions (Signed)
Postpartum Tubal Ligation Postpartum tubal ligation (PPTL) is a procedure that closes the fallopian tubes right after childbirth or 1-2 days after childbirth. PPTL is done before the uterus returns to its normal location. The procedure is also called a mini-laparotomy. When the fallopian tubes are closed, the eggs that are released from the ovaries cannot enter the uterus, and sperm cannot reach the egg. PPTL is done so you will not be able to get pregnant or have a baby. Although this procedure may be undone (reversed), it should be considered permanent and irreversible. If you want to have future pregnancies, you should not have this procedure. LET YOUR HEALTH CARE PROVIDER KNOW ABOUT:  Any allergies you have.  All medicines you are taking, including vitamins, herbs, eye drops, creams, and over-the-counter medicines. This includes any use of steroids, either by mouth or in cream form.  Previous problems you or members of your family have had with the use of anesthetics.  Any blood disorders you have.  Previous surgeries you have had.  Any medical conditions you may have. RISKS AND COMPLICATIONS  Infection.  Bleeding.  Injury to surrounding organs.  Side effects from anesthetics.  Failure of the procedure.  Ectopic pregnancy.  Future regret about having the procedure done. BEFORE THE PROCEDURE  You may need to sign certain documents, including an informed consent form, up to 30 days before the date of your tubal ligation.  Follow instructions from your health care provider about eating and drinking restrictions. PROCEDURE  If done 1-2 days after a vaginal delivery:  You will be given one or more of the following:  A medicine that helps you relax (sedative).  A medicine that numbs the area (local anesthetic).  A medicine that makes you fall asleep (general anesthetic).  A medicine that is injected into an area of your body that numbs everything below the injection site  (regional anesthetic).  If you have been given general anesthetic, a tube will be put down your throat to help you breathe.  Your bladder may be emptied with a small tube (catheter).  A small cut (incision) will be made just above the pubic hair line.  The fallopian tubes will be located and brought up through the incision.  The fallopian tubes will be tied off or burned (cauterized), or they will be closed with a clamp, ring, or clip. In many cases, a small portion in the center of each fallopian tube will also be removed.  The incision will be closed with stitches (sutures).  A bandage (dressing) will be placed over the incision. The procedure may vary among health care providers and hospitals. If done after a cesarean delivery:  Tubal ligation will be done through the incision that was used for the cesarean delivery of your baby.  After the tubes are closed, the incision will be closed with stitches (sutures).  A bandage (dressing) will be placed over the incision. The procedure may vary among health care providers and hospitals. AFTER THE PROCEDURE  Your blood pressure, heart rate, breathing rate, and blood oxygen level will be monitored often until the medicines you were given have worn off.  You will be given pain medicine as needed.  If you had general anesthetic, you may have some mild discomfort in your throat. This is from the breathing tube that was placed in your throat while you were sleeping.  You may feel tired, and you should rest for the remainder of the day.  You may have some pain   or cramps in the abdominal area for 3-7 days.   This information is not intended to replace advice given to you by your health care provider. Make sure you discuss any questions you have with your health care provider.   Document Released: 06/03/2005 Document Revised: 10/18/2014 Document Reviewed: 09/14/2011 Elsevier Interactive Patient Education 2016 Elsevier Inc.  

## 2015-11-03 NOTE — Progress Notes (Signed)
ROB-feels like fatigue is due not sleeping well, OK to try Tylenol PM, desires BTL

## 2015-11-24 ENCOUNTER — Encounter: Payer: Self-pay | Admitting: Obstetrics and Gynecology

## 2015-11-24 ENCOUNTER — Ambulatory Visit (INDEPENDENT_AMBULATORY_CARE_PROVIDER_SITE_OTHER): Payer: Medicaid Other | Admitting: Obstetrics and Gynecology

## 2015-11-24 ENCOUNTER — Other Ambulatory Visit: Payer: Self-pay | Admitting: Obstetrics and Gynecology

## 2015-11-24 ENCOUNTER — Other Ambulatory Visit: Payer: Medicaid Other

## 2015-11-24 VITALS — BP 112/70 | HR 73 | Wt 155.0 lb

## 2015-11-24 DIAGNOSIS — Z3492 Encounter for supervision of normal pregnancy, unspecified, second trimester: Secondary | ICD-10-CM

## 2015-11-24 DIAGNOSIS — Z23 Encounter for immunization: Secondary | ICD-10-CM | POA: Diagnosis not present

## 2015-11-24 LAB — POCT URINALYSIS DIPSTICK
Bilirubin, UA: NEGATIVE
Blood, UA: NEGATIVE
Glucose, UA: NEGATIVE
KETONES UA: NEGATIVE
Nitrite, UA: NEGATIVE
PROTEIN UA: NEGATIVE
Urobilinogen, UA: 0.2
pH, UA: 7.5

## 2015-11-24 MED ORDER — TETANUS-DIPHTH-ACELL PERTUSSIS 5-2.5-18.5 LF-MCG/0.5 IM SUSP
0.5000 mL | Freq: Once | INTRAMUSCULAR | Status: AC
  Administered 2015-11-24: 0.5 mL via INTRAMUSCULAR

## 2015-11-24 NOTE — Progress Notes (Signed)
ROB-doing well, discussed weight gain, already modified diet and is exercising nightly. Plans breastfeeding

## 2015-11-24 NOTE — Progress Notes (Signed)
ROB- btc signed, tdap gtt- done. Slight pink d/c x 1 - no ctx.

## 2015-11-25 LAB — HEMATOCRIT: HEMATOCRIT: 41 % (ref 34.0–46.6)

## 2015-11-25 LAB — PLATELET COUNT: PLATELETS: 233 10*3/uL (ref 150–379)

## 2015-11-25 LAB — GLUCOSE, 1 HOUR GESTATIONAL: Gestational Diabetes Screen: 94 mg/dL (ref 65–139)

## 2015-12-08 ENCOUNTER — Ambulatory Visit (INDEPENDENT_AMBULATORY_CARE_PROVIDER_SITE_OTHER): Payer: Medicaid Other | Admitting: Obstetrics and Gynecology

## 2015-12-08 ENCOUNTER — Encounter: Payer: Self-pay | Admitting: Obstetrics and Gynecology

## 2015-12-08 VITALS — BP 104/77 | HR 103 | Wt 155.2 lb

## 2015-12-08 DIAGNOSIS — Z3493 Encounter for supervision of normal pregnancy, unspecified, third trimester: Secondary | ICD-10-CM

## 2015-12-08 LAB — POCT URINALYSIS DIPSTICK
BILIRUBIN UA: NEGATIVE
Blood, UA: NEGATIVE
GLUCOSE UA: NEGATIVE
KETONES UA: NEGATIVE
Nitrite, UA: NEGATIVE
PH UA: 6.5
Protein, UA: NEGATIVE
Spec Grav, UA: <=1.005
Urobilinogen, UA: 0.2

## 2015-12-08 NOTE — Progress Notes (Signed)
ROB- pt is doing well 

## 2015-12-08 NOTE — Progress Notes (Signed)
ROB- doing well, 

## 2015-12-22 ENCOUNTER — Ambulatory Visit (INDEPENDENT_AMBULATORY_CARE_PROVIDER_SITE_OTHER): Payer: Medicaid Other | Admitting: Obstetrics and Gynecology

## 2015-12-22 ENCOUNTER — Encounter: Payer: Self-pay | Admitting: Obstetrics and Gynecology

## 2015-12-22 VITALS — BP 126/79 | HR 93 | Wt 157.4 lb

## 2015-12-22 DIAGNOSIS — Z3493 Encounter for supervision of normal pregnancy, unspecified, third trimester: Secondary | ICD-10-CM

## 2015-12-22 LAB — POCT URINALYSIS DIPSTICK
BILIRUBIN UA: NEGATIVE
GLUCOSE UA: 250
Ketones, UA: NEGATIVE
Leukocytes, UA: NEGATIVE
NITRITE UA: NEGATIVE
Protein, UA: NEGATIVE
RBC UA: NEGATIVE
SPEC GRAV UA: 1.01
UROBILINOGEN UA: 0.2
pH, UA: 6.5

## 2015-12-22 NOTE — Progress Notes (Signed)
ROB- pt is c/o some fatigue

## 2015-12-22 NOTE — Progress Notes (Signed)
ROB- doing well, 

## 2016-01-05 ENCOUNTER — Ambulatory Visit (INDEPENDENT_AMBULATORY_CARE_PROVIDER_SITE_OTHER): Payer: Medicaid Other | Admitting: Obstetrics and Gynecology

## 2016-01-05 ENCOUNTER — Encounter: Payer: Self-pay | Admitting: Obstetrics and Gynecology

## 2016-01-05 VITALS — BP 118/79 | HR 98 | Wt 158.6 lb

## 2016-01-05 DIAGNOSIS — Z3493 Encounter for supervision of normal pregnancy, unspecified, third trimester: Secondary | ICD-10-CM

## 2016-01-05 DIAGNOSIS — J0100 Acute maxillary sinusitis, unspecified: Secondary | ICD-10-CM

## 2016-01-05 LAB — POCT URINALYSIS DIPSTICK
Bilirubin, UA: NEGATIVE
Glucose, UA: NEGATIVE
Ketones, UA: NEGATIVE
Nitrite, UA: NEGATIVE
PROTEIN UA: NEGATIVE
RBC UA: NEGATIVE
SPEC GRAV UA: 1.01
UROBILINOGEN UA: 0.2
pH, UA: 6.5

## 2016-01-05 MED ORDER — CEFDINIR 300 MG PO CAPS: 300.0000 mg | ORAL_CAPSULE | Freq: Two times a day (BID) | ORAL | Status: DC

## 2016-01-05 NOTE — Progress Notes (Signed)
ROB-sinusitis in frontal and maxillary sinuses- recommend sudafed and vick's- rx sent in for antibiotics, cultures next visit

## 2016-01-05 NOTE — Progress Notes (Signed)
ROB- pt is c/o sinus drainage, B ear pain, facial pressure, coughing-non productive Pt is having some pelvic pressure, having some low back pain

## 2016-01-19 ENCOUNTER — Inpatient Hospital Stay
Admission: EM | Admit: 2016-01-19 | Discharge: 2016-01-19 | Disposition: A | Discharge status: Home / Self Care | Payer: Medicaid Other | Attending: Family | Admitting: Family

## 2016-01-19 DIAGNOSIS — O479 False labor, unspecified: Secondary | ICD-10-CM | POA: Diagnosis present

## 2016-01-19 DIAGNOSIS — Z3A36 36 weeks gestation of pregnancy: Secondary | ICD-10-CM | POA: Insufficient documentation

## 2016-01-19 LAB — URINALYSIS COMPLETE WITH MICROSCOPIC (ARMC ONLY)
Bilirubin Urine: NEGATIVE
GLUCOSE, UA: NEGATIVE mg/dL
Hgb urine dipstick: NEGATIVE
Nitrite: NEGATIVE
Protein, ur: NEGATIVE mg/dL
Specific Gravity, Urine: 1.002 — ABNORMAL LOW (ref 1.005–1.030)
pH: 7 (ref 5.0–8.0)

## 2016-01-19 LAB — OB RESULTS CONSOLE GBS: GBS: NEGATIVE

## 2016-01-19 MED ORDER — HYDROXYZINE HCL 25 MG PO TABS: 25.0000 mg | ORAL_TABLET | Freq: Every evening | ORAL | 0 refills | Status: DC | PRN

## 2016-01-19 MED ORDER — LACTATED RINGERS IV BOLUS (SEPSIS)
1000.0000 mL | Freq: Once | INTRAVENOUS | Status: AC
  Administered 2016-01-19: 1000 mL via INTRAVENOUS

## 2016-01-19 MED ORDER — NITROFURANTOIN MONOHYD MACRO 100 MG PO CAPS: 100.0000 mg | ORAL_CAPSULE | Freq: Two times a day (BID) | ORAL | 1 refills | Status: DC

## 2016-01-19 MED ORDER — NIFEDIPINE 10 MG PO CAPS: 10.0000 mg | ORAL_CAPSULE | ORAL | Status: DC

## 2016-01-19 MED ORDER — PROMETHAZINE HCL 25 MG/ML IJ SOLN
12.5000 mg | Freq: Once | INTRAMUSCULAR | Status: AC
  Administered 2016-01-19: 12.5 mg via INTRAVENOUS
  Filled 2016-01-19: qty 1

## 2016-01-19 MED ORDER — PROMETHAZINE HCL 12.5 MG PO TABS: 12.5000 mg | ORAL_TABLET | Freq: Four times a day (QID) | ORAL | 0 refills | Status: DC | PRN

## 2016-01-19 MED ORDER — NIFEDIPINE 10 MG PO CAPS
10.0000 mg | ORAL_CAPSULE | ORAL | Status: AC
  Administered 2016-01-19: 10 mg via ORAL
  Filled 2016-01-19: qty 1

## 2016-01-19 NOTE — OB Triage Provider Note (Signed)
History   [id number]   Chief Complaint  Patient presents with  . Contractions    HPI Susan Adkins is a 25 y.o. female  G2P1001 at [redacted]w[redacted]d IUP here with report of contractions starting at 2100 last night that became regular around 0100 this AM.  +Nausea and vomiting x 4 after the contractions began.  Denies vaginal bleeding or leaking of fluid.  No report of UTI symptoms and no known exposure to ill individuals.  Between contractions patient reports lower pelvic pressure and low mid-back pain.     Patient's last menstrual period was 05/12/2015.  OB History  Gravida Para Term Preterm AB Living  SAB TAB Ectopic Multiple Live Births          1    # Outcome Date GA Lbr Len/2nd Weight Sex Delivery Anes PTL Lv  2 Current           1 Term 2015   3.345 kg (7 lb 6 oz) M Vag-Spont EPI N LIV      Past Medical History:  Diagnosis Date  . Asthma    Well controlled.   . Chicken pox   . Fainting episodes   . Migraines    Well controlled on amitriptyline   . UTI (lower urinary tract infection)     Family History  Problem Relation Age of Onset  . Interstitial cystitis Mother   . Depression Mother   . Anxiety disorder Mother   . Bipolar disorder Mother   . Hypertension Father   . Cancer Maternal Grandmother 58    Breast cancer  . Depression Maternal Grandmother   . Hypertension Paternal Grandmother     Social History   Social History  . Marital status: Married    Spouse name: Barron Schmid  . Number of children: 0  . Years of education: 12   Occupational History  . [employer]    Social History Main Topics  . Smoking status: Never Smoker  . Smokeless tobacco: Never Used  . Alcohol use 0.5 oz/week    1 Standard drinks or equivalent per week  . Drug use: No  . Sexual activity: Yes    Partners: Male   Other Topics Concern  . None   Social History Narrative  . None    No Known Allergies  No current facility-administered medications on  file prior to encounter.    Current Outpatient Prescriptions on File Prior to Encounter  Medication Sig Dispense Refill  . Doxylamine-Pyridoxine (DICLEGIS) 10-10 MG TBEC Take 1 tablet by mouth 2 (two) times daily. 60 tablet 3  . PRENATAL VITAMINS PO Take by mouth.    . cefdinir (OMNICEF) 300 MG capsule Take 1 capsule (300 mg total) by mouth 2 (two) times daily. (Patient not taking: Reported on 01/19/2016) 14 capsule 1     Review of Systems  Constitutional: Negative for chills and fever.  Gastrointestinal: Positive for nausea and vomiting. Negative for diarrhea.  Genitourinary: Positive for pelvic pain (lower pressure and contractions). Negative for dysuria, frequency, hematuria and vaginal bleeding.  Neurological: Negative for headaches.     Physical Exam   Vitals:   01/19/16 0301 01/19/16 0505 01/19/16 0744  BP: 137/82  117/75  Pulse: 100  (!) 113  Resp: 18 20   Temp: 98.3 F (36.8 C) 99 F (37.2 C) 99 F (37.2 C)  TempSrc: Oral Oral Oral  Weight: 71.7 kg (158 lb)    Height:  5\' 2"  (1.575 m)      Physical Exam  Constitutional: She is oriented to person, place, and time. She appears well-developed and well-nourished.  HENT:  Head: Normocephalic.  Neck: Normal range of motion. Neck supple.  Cardiovascular: Normal rate, regular rhythm and normal heart sounds.   Respiratory: Effort normal and breath sounds normal. No respiratory distress.  GI: Soft. There is no tenderness.  Genitourinary: No bleeding in the vagina. Vaginal discharge (mucusy) found.  Musculoskeletal: Normal range of motion. She exhibits no edema.  Neurological: She is alert and oriented to person, place, and time.  Skin: Skin is warm and dry.    0175 Cervix closed/thick   OB Triage  Procedures  1 Liter of LR with 12.5 mg of phenergan > patient reports improvement in nausea symptoms  0635 Cervical recheck  > 1/50/-3 > Procardia 10 mg PO  0750 Cervical recheck > no change > reports decrease in pain,  rated 4/10, still aware contractions are present; feels does not need anything for pain   MDM Results for orders placed or performed during the hospital encounter of 01/19/16 (from the past 24 hour(s))  Urinalysis complete, with microscopic ([facility] only)     Status: Abnormal   Collection Time: 01/19/16  3:56 AM  Result Value Ref Range   Color, Urine STRAW (A) YELLOW   APPearance CLEAR (A) CLEAR   Glucose, UA NEGATIVE NEGATIVE mg/dL   Bilirubin Urine NEGATIVE NEGATIVE   Ketones, ur TRACE (A) NEGATIVE mg/dL   Specific Gravity, Urine 1.002 (L) 1.005 - 1.030   Hgb urine dipstick NEGATIVE NEGATIVE   pH 7.0 5.0 - 8.0   Protein, ur NEGATIVE NEGATIVE mg/dL   Nitrite NEGATIVE NEGATIVE   Leukocytes, UA 1+ (A) NEGATIVE   RBC / HPF 0-5 0 - 5 RBC/hpf   WBC, UA 0-5 0 - 5 WBC/hpf   Bacteria, UA RARE (A) NONE SEEN   Squamous Epithelial / LPF 0-5 (A) NONE SEEN    0805 Consulted with Dr. Valentino Saxon > Reviewed HPI/Exam/orders during admission > may discharge home with labor precautions  0825 Prior to discharge [name] reports increase in contraction pain described as a "burning" on lower pelvis > Procardia 10 mg PO ordered > pt declined another dose  0945  Cervical recheck > no change   Assessment and Plan  25 y.o. G2P1001 at [redacted]w[redacted]d IUP  Preterm Contractions Reactive NST Leukocytes in Urine  Plan: Discharge home GBS collected RX Macrobid 100 mg BID x 7 days RX Vistaril 50 mg prn RX Phenergan prn nausea Return if contractions increase in frequency/intensity Urine sent to lab for culture (negative two weeks ago with 2+ leuks)  Marlis Edelson, CNM 01/19/2016 9:55 AM

## 2016-01-19 NOTE — OB Triage Note (Signed)
Patient arrived to triage with c/o ctx's since approx. 2315. States positive fetal movement, denies leaking of fluid, denies vaginal bleeding.

## 2016-01-20 LAB — URINE CULTURE: CULTURE: NO GROWTH

## 2016-01-21 LAB — CULTURE, BETA STREP (GROUP B ONLY)

## 2016-01-23 ENCOUNTER — Ambulatory Visit (INDEPENDENT_AMBULATORY_CARE_PROVIDER_SITE_OTHER): Payer: Medicaid Other | Admitting: Obstetrics and Gynecology

## 2016-01-23 VITALS — BP 135/90 | HR 97 | Wt 162.3 lb

## 2016-01-23 DIAGNOSIS — Z113 Encounter for screening for infections with a predominantly sexual mode of transmission: Secondary | ICD-10-CM

## 2016-01-23 DIAGNOSIS — Z3493 Encounter for supervision of normal pregnancy, unspecified, third trimester: Secondary | ICD-10-CM

## 2016-01-23 LAB — POCT URINALYSIS DIPSTICK
BILIRUBIN UA: NEGATIVE
Blood, UA: NEGATIVE
GLUCOSE UA: 100
Ketones, UA: NEGATIVE
Leukocytes, UA: NEGATIVE
NITRITE UA: NEGATIVE
Protein, UA: NEGATIVE
Spec Grav, UA: 1.01
UROBILINOGEN UA: 0.2
pH, UA: 6.5

## 2016-01-23 NOTE — Progress Notes (Signed)
Follow-up hospital - reports irregular contractions each night since d/c with spotting in the mucus. [facility]

## 2016-01-23 NOTE — Progress Notes (Signed)
ROB- gc aptima obtained, pt went to ER with contractions, she is having them now, lots of pelvic pressure

## 2016-01-25 LAB — GC/CHLAMYDIA PROBE AMP
Chlamydia trachomatis, NAA: NEGATIVE
NEISSERIA GONORRHOEAE BY PCR: NEGATIVE

## 2016-01-26 ENCOUNTER — Encounter: Payer: Medicaid Other | Admitting: Obstetrics and Gynecology

## 2016-02-01 ENCOUNTER — Inpatient Hospital Stay
Admission: EM | Admit: 2016-02-01 | Discharge: 2016-02-04 | DRG: 767 | Disposition: A | Discharge status: Home / Self Care | Payer: Medicaid Other | Attending: Obstetrics and Gynecology | Admitting: Obstetrics and Gynecology

## 2016-02-01 DIAGNOSIS — O9952 Diseases of the respiratory system complicating childbirth: Secondary | ICD-10-CM | POA: Diagnosis present

## 2016-02-01 DIAGNOSIS — O42113 Preterm premature rupture of membranes, onset of labor more than 24 hours following rupture, third trimester: Secondary | ICD-10-CM

## 2016-02-01 DIAGNOSIS — O4290 Premature rupture of membranes, unspecified as to length of time between rupture and onset of labor, unspecified weeks of gestation: Secondary | ICD-10-CM | POA: Diagnosis present

## 2016-02-01 DIAGNOSIS — J45909 Unspecified asthma, uncomplicated: Secondary | ICD-10-CM | POA: Diagnosis present

## 2016-02-01 DIAGNOSIS — Z3A38 38 weeks gestation of pregnancy: Secondary | ICD-10-CM

## 2016-02-01 DIAGNOSIS — Z3483 Encounter for supervision of other normal pregnancy, third trimester: Secondary | ICD-10-CM | POA: Diagnosis not present

## 2016-02-01 DIAGNOSIS — G43909 Migraine, unspecified, not intractable, without status migrainosus: Secondary | ICD-10-CM | POA: Diagnosis present

## 2016-02-01 DIAGNOSIS — Z302 Encounter for sterilization: Secondary | ICD-10-CM

## 2016-02-01 LAB — TYPE AND SCREEN
ABO/RH(D): O POS
Antibody Screen: NEGATIVE

## 2016-02-01 LAB — CBC
HEMATOCRIT: 39.5 % (ref 35.0–47.0)
Hemoglobin: 13.5 g/dL (ref 12.0–16.0)
MCH: 31.1 pg (ref 26.0–34.0)
MCHC: 34.2 g/dL (ref 32.0–36.0)
MCV: 90.7 fL (ref 80.0–100.0)
PLATELETS: 225 10*3/uL (ref 150–440)
RBC: 4.36 MIL/uL (ref 3.80–5.20)
RDW: 12.3 % (ref 11.5–14.5)
WBC: 11.3 10*3/uL — AB (ref 3.6–11.0)

## 2016-02-01 LAB — CHLAMYDIA/NGC RT PCR (ARMC ONLY)
CHLAMYDIA TR: NOT DETECTED
N gonorrhoeae: NOT DETECTED

## 2016-02-01 LAB — RAPID HIV SCREEN (HIV 1/2 AB+AG)
HIV 1/2 Antibodies: NONREACTIVE
HIV-1 P24 ANTIGEN - HIV24: NONREACTIVE

## 2016-02-01 MED ORDER — FENTANYL CITRATE (PF) 100 MCG/2ML IJ SOLN
50.0000 ug | INTRAMUSCULAR | Status: DC | PRN
  Administered 2016-02-02: 50 ug via INTRAVENOUS
  Filled 2016-02-01: qty 2

## 2016-02-01 MED ORDER — ACETAMINOPHEN 325 MG PO TABS: 650.0000 mg | ORAL_TABLET | ORAL | Status: DC | PRN

## 2016-02-01 MED ORDER — OXYTOCIN BOLUS FROM INFUSION: 500.0000 mL | Freq: Once | INTRAVENOUS | Status: DC

## 2016-02-01 MED ORDER — LIDOCAINE HCL (PF) 1 % IJ SOLN: 30.0000 mL | INTRAMUSCULAR | Status: DC | PRN

## 2016-02-01 MED ORDER — MISOPROSTOL 200 MCG PO TABS
ORAL_TABLET | ORAL | Status: AC
  Filled 2016-02-01: qty 4

## 2016-02-01 MED ORDER — OXYTOCIN 40 UNITS IN LACTATED RINGERS INFUSION - SIMPLE MED
2.5000 [IU]/h | INTRAVENOUS | Status: DC
  Filled 2016-02-01: qty 1000

## 2016-02-01 MED ORDER — AMMONIA AROMATIC IN INHA
RESPIRATORY_TRACT | Status: AC
  Filled 2016-02-01: qty 10

## 2016-02-01 MED ORDER — LACTATED RINGERS IV SOLN
INTRAVENOUS | Status: DC
  Administered 2016-02-01 – 2016-02-02 (×4): via INTRAVENOUS

## 2016-02-01 MED ORDER — LIDOCAINE HCL (PF) 1 % IJ SOLN
INTRAMUSCULAR | Status: AC
  Filled 2016-02-01: qty 30

## 2016-02-01 MED ORDER — ONDANSETRON HCL 4 MG/2ML IJ SOLN: 4.0000 mg | Freq: Four times a day (QID) | INTRAMUSCULAR | Status: DC | PRN

## 2016-02-01 MED ORDER — LACTATED RINGERS IV SOLN: 500.0000 mL | INTRAVENOUS | Status: DC | PRN

## 2016-02-01 MED ORDER — OXYTOCIN 10 UNIT/ML IJ SOLN
INTRAMUSCULAR | Status: AC
  Filled 2016-02-01: qty 2

## 2016-02-01 MED ORDER — SOD CITRATE-CITRIC ACID 500-334 MG/5ML PO SOLN: 30.0000 mL | ORAL | Status: DC | PRN

## 2016-02-01 NOTE — H&P (Signed)
Obstetric History and Physical  Susan ArntMorgan M Rundquist is a 25 y.o. G2P1001 with IUP at 5817w6d presenting with SROM at 1830. Patient states she has been having  irregular, every couple minutes contractions, none vaginal bleeding, ruptured, clear fluid membranes, with active fetal movement.    [facility]  Pregnancy complications or risks:none  Prenatal labs and studies: ABO, Rh: O/Positive/-- (01/06 1403) Antibody: Negative (01/06 1403) Rubella: 2.61, <20.0 (01/06 1403) RPR: Non Reactive (01/06 1403)  HBsAg: Negative (01/06 1403)  HIV: Non Reactive (01/06 1403)  GBS:  1 hr Glucola  normal Genetic screening normal Anatomy US normal  Past Medical History:  Diagnosis Date  . Asthma    Well controlled.   . Chicken pox   . Fainting episodes   . Migraines    Well controlled on amitriptyline   . UTI (lower urinary tract infection)     Past Surgical History:  Procedure Laterality Date  . TONSILLECTOMY  2005    OB History  Gravida Para Term Preterm AB Living  2 1 1     1   SAB TAB Ectopic Multiple Live Births          1    # Outcome Date GA Lbr Len/2nd Weight Sex Delivery Anes PTL Lv  2 Current           1 Term 2015   7 lb 6 oz (3.345 kg) M Vag-Spont EPI N LIV      Social History   Social History  . Marital status: Married    Spouse name: Barron SchmidSteven Warnock  . Number of children: 0  . Years of education: 12   Occupational History  . [employer]    Social History Main Topics  . Smoking status: Never Smoker  . Smokeless tobacco: Never Used  . Alcohol use 0.5 oz/week    1 Standard drinks or equivalent per week  . Drug use: No  . Sexual activity: Yes    Partners: Male   Other Topics Concern  . None   Social History Narrative  . None    Family History  Problem Relation Age of Onset  . Interstitial cystitis Mother   . Depression Mother   . Anxiety disorder Mother   . Bipolar disorder Mother   . Hypertension Father   . Cancer  Maternal Grandmother [id number]    Breast cancer  . Depression Maternal Grandmother   . Hypertension Paternal Grandmother     Prescriptions Prior to Admission  Medication Sig Dispense Refill Last Dose  . Doxylamine-Pyridoxine (DICLEGIS) 10-10 MG TBEC Take 1 tablet by mouth 2 (two) times daily. 60 tablet 3 01/31/2016 at 2100  . PRENATAL VITAMINS PO Take by mouth.   01/30/2016 at 2100  . hydrOXYzine (ATARAX/VISTARIL) 25 MG tablet Take 1 tablet (25 mg total) by mouth at bedtime as needed (Early labor). (Patient not taking: Reported on 01/23/2016) 5 tablet 0 Not Taking at Unknown time  . nitrofurantoin, macrocrystal-monohydrate, (MACROBID) 100 MG capsule Take 1 capsule (100 mg total) by mouth 2 (two) times daily. (Patient not taking: Reported on 01/23/2016) 14 capsule 1 Not Taking at Unknown time  . promethazine (PHENERGAN) 12.5 MG tablet Take 1 tablet (12.5 mg total) by mouth every 6 (six) hours as needed for nausea. (Patient not taking: Reported on 02/01/2016) 15 tablet 0 Not Taking at Unknown time    No Known Allergies  Review of Systems: Negative except for what is mentioned in HPI.  Physical Exam: BP Marland Kitchen(!)  132/91 (BP Location: Left Arm)   Pulse (!) 102   Temp 99.2 F (37.3 C) (Oral)   Resp 18   Ht 5\' 2"  (1.575 m)   Wt 162 lb (73.5 kg)   LMP 05/12/2015   BMI 29.63 kg/m  GENERAL: Well-developed, well-nourished female in no acute distress.  LUNGS: Clear to auscultation bilaterally.  HEART: Regular rate and rhythm. ABDOMEN: Soft, nontender, nondistended, gravid. EXTREMITIES: Nontender, no edema, 2+ distal pulses. Cervical Exam: Dilation: Fingertip Effacement (%): 50 Cervical Position: Middle Station: -3 Presentation: Vertex Exam by:: [facility] RN FHT:  Baseline rate 145 bpm   Variability moderate  Accelerations present   Decelerations none Contractions: Every 6-10 mins, mild to palpation   Pertinent Labs/Studies:   No results found for this or any previous visit (from the past 24  hour(s)).  Assessment : Susan Adkins is a 25 y.o. G2P1001 at 8336w6d being admitted for labor.  Plan: Labor: Expectant management.  Induction/Augmentation as needed, per protocol FWB: Reassuring fetal heart tracing.  GBS negative Delivery plan: Hopeful for vaginal delivery  Mardell Cragg, CNM [facility]

## 2016-02-02 ENCOUNTER — Encounter: Payer: Medicaid Other | Admitting: Obstetrics and Gynecology

## 2016-02-02 ENCOUNTER — Inpatient Hospital Stay: Payer: Medicaid Other | Admitting: Certified Registered"

## 2016-02-02 DIAGNOSIS — Z3483 Encounter for supervision of other normal pregnancy, third trimester: Secondary | ICD-10-CM

## 2016-02-02 LAB — MRSA PCR SCREENING: MRSA BY PCR: NEGATIVE

## 2016-02-02 MED ORDER — LIDOCAINE HCL (PF) 1 % IJ SOLN
INTRAMUSCULAR | Status: DC | PRN
  Administered 2016-02-02: 3 mL via SUBCUTANEOUS

## 2016-02-02 MED ORDER — FENTANYL 2.5 MCG/ML W/ROPIVACAINE 0.2% IN NS 100 ML EPIDURAL INFUSION (ARMC-ANES)
EPIDURAL | Status: AC
  Filled 2016-02-02: qty 100

## 2016-02-02 MED ORDER — LIDOCAINE-EPINEPHRINE (PF) 1.5 %-1:200000 IJ SOLN
INTRAMUSCULAR | Status: DC | PRN
  Administered 2016-02-02: 3 mL via EPIDURAL

## 2016-02-02 MED ORDER — OXYTOCIN 40 UNITS IN LACTATED RINGERS INFUSION - SIMPLE MED
1.0000 m[IU]/min | INTRAVENOUS | Status: DC
  Administered 2016-02-02: 1 m[IU]/min via INTRAVENOUS

## 2016-02-02 MED ORDER — MISOPROSTOL 200 MCG PO TABS
ORAL_TABLET | ORAL | Status: AC
  Filled 2016-02-02: qty 4

## 2016-02-02 MED ORDER — FENTANYL 2.5 MCG/ML W/ROPIVACAINE 0.2% IN NS 100 ML EPIDURAL INFUSION (ARMC-ANES)
EPIDURAL | Status: DC | PRN
  Administered 2016-02-02: 9 mL/h via EPIDURAL

## 2016-02-02 MED ORDER — AMMONIA AROMATIC IN INHA
RESPIRATORY_TRACT | Status: AC
  Filled 2016-02-02: qty 10

## 2016-02-02 MED ORDER — OXYTOCIN 10 UNIT/ML IJ SOLN
INTRAMUSCULAR | Status: AC
  Filled 2016-02-02: qty 2

## 2016-02-02 MED ORDER — TERBUTALINE SULFATE 1 MG/ML IJ SOLN: 0.2500 mg | Freq: Once | INTRAMUSCULAR | Status: DC | PRN

## 2016-02-02 MED ORDER — BUPIVACAINE HCL (PF) 0.25 % IJ SOLN
INTRAMUSCULAR | Status: DC | PRN
  Administered 2016-02-02: 3 mL via EPIDURAL
  Administered 2016-02-02: 5 mL via EPIDURAL

## 2016-02-02 MED ORDER — LIDOCAINE HCL (PF) 1 % IJ SOLN
INTRAMUSCULAR | Status: AC
  Filled 2016-02-02: qty 30

## 2016-02-02 NOTE — Progress Notes (Signed)
Elon SpannerMorgan M XXXTickle is a 25 y.o. G2P1001 at 4467w0d by LMP admitted for rupture of membranes  Subjective: Reports worse pain with contractions, is crying with them and having difficulty relaxing in between them, request pain meds.  Objective: BP 129/82 (BP Location: Left Arm)   Pulse 98   Temp 98.9 F (37.2 C) (Oral)   Resp 18   Ht 5\' 2"  (1.575 m)   Wt 162 lb (73.5 kg)   LMP 05/12/2015   BMI 29.63 kg/m  I/O last 3 completed shifts: In: 1312.5 [I.V.:1312.5] Out: -  Total I/O In: 750 [I.V.:750] Out: -   FHT:  FHR: 144 bpm, variability: moderate,  accelerations:  Present,  decelerations:  Absent UC:   irregular, every 2-3 minutes. Moderate to palpation, on pitocin SVE:   Dilation: 2.5 Effacement (%): 50 Station: -3 Exam by:: [employer], Martin[place] Pizzimenti CNM  Labs: Lab Results  Component Value Date   WBC 11.3 (H) 02/01/2016   HGB 13.5 02/01/2016   HCT 39.5 02/01/2016   MCV 90.7 02/01/2016   PLT 225 02/01/2016    Assessment / Plan: Augmentation of labor, progressing well  Labor: Progressing normally Preeclampsia:  labs stable Fetal Wellbeing:  Category I Pain Control:  Labor support without medications, will try nitrous oxide I/D:  n/a Anticipated MOD:  NSVD  Ayen Viviano N Darsha Zumstein, CNM 02/02/2016, 1:56 PM

## 2016-02-02 NOTE — Anesthesia Preprocedure Evaluation (Addendum)
Anesthesia Evaluation  Patient identified by MRN, date of birth, ID band Patient awake    Reviewed: H&P , NPO status , Patient's Chart, lab work & pertinent test results  History of Anesthesia Complications Negative for: history of anesthetic complications  Airway Mallampati: II  TM Distance: >3 FB Neck ROM: full    Dental no notable dental hx.    Pulmonary asthma ,    Pulmonary exam normal        Cardiovascular negative cardio ROS Normal cardiovascular exam     Neuro/Psych  Headaches, negative psych ROS   GI/Hepatic Neg liver ROS, GERD  ,  Endo/Other  negative endocrine ROS  Renal/GU negative Renal ROS  negative genitourinary   Musculoskeletal   Abdominal   Peds  Hematology negative hematology ROS (+)   Anesthesia Other Findings   Reproductive/Obstetrics (+) Pregnancy                             Anesthesia Physical Anesthesia Plan  ASA: II  Anesthesia Plan: Epidural   Post-op Pain Management:    Induction:   Airway Management Planned:   Additional Equipment:   Intra-op Plan:   Post-operative Plan:   Informed Consent: I have reviewed the patients History and Physical, chart, labs and discussed the procedure including the risks, benefits and alternatives for the proposed anesthesia with the patient or authorized representative who has indicated his/her understanding and acceptance.     Plan Discussed with: Anesthesiologist and CRNA  Anesthesia Plan Comments:         Anesthesia Quick Evaluation

## 2016-02-02 NOTE — Progress Notes (Signed)
Susan Adkins is a 25 y.o. G2P1001 at 1128w0d by LMP admitted for rupture of membranes  Subjective: Denies pain with contractions, still leaking clear fluid  Objective: BP 131/86 (BP Location: Left Arm)   Pulse 97   Temp 98.7 F (37.1 C) (Oral)   Resp 18   Ht 5\' 2"  (1.575 m)   Wt 162 lb (73.5 kg)   LMP 05/12/2015   BMI 29.63 kg/m  I/O last 3 completed shifts: In: 1312.5 [I.V.:1312.5] Out: -  No intake/output data recorded.  FHT:  FHR: 144 bpm, variability: moderate,  accelerations:  Present,  decelerations:  Absent UC:   irregular, every 5-7 minutes, mild to palpationSVE:   Dilation: Fingertip Effacement (%): 50 Station: -3 Exam by:: [facility] RN  Labs: Lab Results  Component Value Date   WBC 11.3 (H) 02/01/2016   HGB 13.5 02/01/2016   HCT 39.5 02/01/2016   MCV 90.7 02/01/2016   PLT 225 02/01/2016    Assessment / Plan: PSROM with no advancing labor- will start pitocin augmentation  Labor: pitocin per policy Preeclampsia:  labs stable Fetal Wellbeing:  Category I Pain Control:  Labor support without medications I/D:  n/a Anticipated MOD:  NSVD  Melody N Shambley 02/02/2016, 7:46 AM

## 2016-02-02 NOTE — Anesthesia Procedure Notes (Signed)
Epidural Patient location during procedure: OB  Staffing Anesthesiologist: Yves DillARROLL, PAUL Resident/CRNA: Mathews ArgyleLOGAN, Dewell Monnier Performed: resident/CRNA   Preanesthetic Checklist Completed: patient identified, site marked, surgical consent, pre-op evaluation, timeout performed, IV checked, risks and benefits discussed and monitors and equipment checked  Epidural Patient position: sitting Prep: Betadine and site prepped and draped Patient monitoring: heart rate, continuous pulse ox and blood pressure Approach: midline Location: L4-L5 Injection technique: LOR saline  Needle:  Needle type: Tuohy  Needle gauge: 17 G Needle length: 9 cm Needle insertion depth: 5 cm Catheter type: closed end flexible Catheter size: 19 Gauge Catheter at skin depth: 10 cm Test dose: negative and 1.5% lidocaine with Epi 1:200 K  Assessment Events: blood not aspirated, injection not painful, no injection resistance, negative IV test and no paresthesia  Additional Notes   Patient tolerated the insertion well without complications.Reason for block:procedure for pain

## 2016-02-03 ENCOUNTER — Inpatient Hospital Stay: Payer: Medicaid Other | Admitting: Anesthesiology

## 2016-02-03 ENCOUNTER — Encounter
Admission: EM | Disposition: A | Discharge status: Home / Self Care | Payer: Self-pay | Source: Home / Self Care | Attending: Obstetrics and Gynecology

## 2016-02-03 ENCOUNTER — Encounter: Payer: Self-pay | Admitting: Anesthesiology

## 2016-02-03 DIAGNOSIS — Z302 Encounter for sterilization: Secondary | ICD-10-CM

## 2016-02-03 HISTORY — PX: TUBAL LIGATION: SHX77

## 2016-02-03 LAB — CBC
HEMATOCRIT: 37.2 % (ref 35.0–47.0)
HEMOGLOBIN: 12.9 g/dL (ref 12.0–16.0)
MCH: 31.8 pg (ref 26.0–34.0)
MCHC: 34.7 g/dL (ref 32.0–36.0)
MCV: 91.7 fL (ref 80.0–100.0)
Platelets: 195 10*3/uL (ref 150–440)
RBC: 4.06 MIL/uL (ref 3.80–5.20)
RDW: 12.2 % (ref 11.5–14.5)
WBC: 14 10*3/uL — AB (ref 3.6–11.0)

## 2016-02-03 LAB — RPR: RPR: NONREACTIVE

## 2016-02-03 SURGERY — LIGATION, FALLOPIAN TUBE, BILATERAL
Anesthesia: General | Laterality: Bilateral

## 2016-02-03 MED ORDER — CEFAZOLIN SODIUM 1 G IJ SOLR
INTRAMUSCULAR | Status: DC | PRN
  Administered 2016-02-03: 1 g via INTRAMUSCULAR

## 2016-02-03 MED ORDER — LACTATED RINGERS IV SOLN
INTRAVENOUS | Status: DC
  Administered 2016-02-03 (×2): via INTRAVENOUS

## 2016-02-03 MED ORDER — DIBUCAINE 1 % RE OINT: 1.0000 "application " | TOPICAL_OINTMENT | RECTAL | Status: DC | PRN

## 2016-02-03 MED ORDER — CEFAZOLIN SODIUM-DEXTROSE 2-4 GM/100ML-% IV SOLN: 2.0000 g | INTRAVENOUS | Status: DC

## 2016-02-03 MED ORDER — ONDANSETRON HCL 4 MG/2ML IJ SOLN
INTRAMUSCULAR | Status: DC | PRN
  Administered 2016-02-03: 4 mg via INTRAVENOUS

## 2016-02-03 MED ORDER — OXYCODONE HCL 5 MG PO TABS
10.0000 mg | ORAL_TABLET | ORAL | Status: DC | PRN
Start: 2016-02-03 — End: 2016-02-03

## 2016-02-03 MED ORDER — ONDANSETRON HCL 4 MG/2ML IJ SOLN: 4.0000 mg | Freq: Once | INTRAMUSCULAR | Status: DC | PRN

## 2016-02-03 MED ORDER — GLYCOPYRROLATE 0.2 MG/ML IJ SOLN
INTRAMUSCULAR | Status: DC | PRN
  Administered 2016-02-03: .4 mg via INTRAVENOUS

## 2016-02-03 MED ORDER — SENNOSIDES-DOCUSATE SODIUM 8.6-50 MG PO TABS: 2.0000 | ORAL_TABLET | ORAL | Status: DC

## 2016-02-03 MED ORDER — OXYCODONE HCL 5 MG PO TABS: 5.0000 mg | ORAL_TABLET | ORAL | Status: DC | PRN

## 2016-02-03 MED ORDER — COCONUT OIL OIL: 1.0000 "application " | TOPICAL_OIL | Status: DC | PRN

## 2016-02-03 MED ORDER — SOD CITRATE-CITRIC ACID 500-334 MG/5ML PO SOLN: 30.0000 mL | ORAL | Status: DC

## 2016-02-03 MED ORDER — WITCH HAZEL-GLYCERIN EX PADS: 1.0000 "application " | MEDICATED_PAD | CUTANEOUS | Status: DC | PRN

## 2016-02-03 MED ORDER — ONDANSETRON HCL 4 MG PO TABS: 4.0000 mg | ORAL_TABLET | ORAL | Status: DC | PRN

## 2016-02-03 MED ORDER — TETANUS-DIPHTH-ACELL PERTUSSIS 5-2.5-18.5 LF-MCG/0.5 IM SUSP: 0.5000 mL | Freq: Once | INTRAMUSCULAR | Status: DC

## 2016-02-03 MED ORDER — FENTANYL CITRATE (PF) 100 MCG/2ML IJ SOLN
25.0000 ug | INTRAMUSCULAR | Status: AC | PRN
  Administered 2016-02-03 (×6): 25 ug via INTRAVENOUS

## 2016-02-03 MED ORDER — FENTANYL CITRATE (PF) 100 MCG/2ML IJ SOLN
INTRAMUSCULAR | Status: DC | PRN
  Administered 2016-02-03 (×3): 50 ug via INTRAVENOUS

## 2016-02-03 MED ORDER — DOCUSATE SODIUM 100 MG PO CAPS
100.0000 mg | ORAL_CAPSULE | Freq: Two times a day (BID) | ORAL | Status: DC
  Administered 2016-02-03 – 2016-02-04 (×2): 100 mg via ORAL
  Filled 2016-02-03 (×2): qty 1

## 2016-02-03 MED ORDER — IBUPROFEN 600 MG PO TABS
600.0000 mg | ORAL_TABLET | Freq: Four times a day (QID) | ORAL | Status: DC
  Administered 2016-02-03 (×2): 600 mg via ORAL
  Filled 2016-02-03 (×2): qty 1

## 2016-02-03 MED ORDER — ROCURONIUM BROMIDE 100 MG/10ML IV SOLN
INTRAVENOUS | Status: DC | PRN
  Administered 2016-02-03: 5 mg via INTRAVENOUS
  Administered 2016-02-03: 30 mg via INTRAVENOUS

## 2016-02-03 MED ORDER — OXYCODONE HCL 5 MG PO TABS
5.0000 mg | ORAL_TABLET | ORAL | Status: DC | PRN
  Administered 2016-02-03: 5 mg via ORAL
  Filled 2016-02-03 (×2): qty 1

## 2016-02-03 MED ORDER — ACETAMINOPHEN 325 MG PO TABS: 650.0000 mg | ORAL_TABLET | ORAL | Status: DC | PRN

## 2016-02-03 MED ORDER — ONDANSETRON HCL 4 MG/2ML IJ SOLN: 4.0000 mg | INTRAMUSCULAR | Status: DC | PRN

## 2016-02-03 MED ORDER — DOCUSATE SODIUM 100 MG PO CAPS
100.0000 mg | ORAL_CAPSULE | Freq: Two times a day (BID) | ORAL | Status: DC
Start: 2016-02-03 — End: 2016-02-03

## 2016-02-03 MED ORDER — ACETAMINOPHEN 325 MG PO TABS
650.0000 mg | ORAL_TABLET | ORAL | Status: DC | PRN
  Filled 2016-02-03: qty 2

## 2016-02-03 MED ORDER — IBUPROFEN 600 MG PO TABS
600.0000 mg | ORAL_TABLET | Freq: Four times a day (QID) | ORAL | Status: DC
  Administered 2016-02-03 – 2016-02-04 (×4): 600 mg via ORAL
  Filled 2016-02-03 (×5): qty 1

## 2016-02-03 MED ORDER — LIDOCAINE HCL (CARDIAC) 20 MG/ML IV SOLN
INTRAVENOUS | Status: DC | PRN
  Administered 2016-02-03: 80 mg via INTRAVENOUS

## 2016-02-03 MED ORDER — PRENATAL MULTIVITAMIN CH
1.0000 | ORAL_TABLET | Freq: Every day | ORAL | Status: DC
  Administered 2016-02-04: 1 via ORAL
  Filled 2016-02-03: qty 1

## 2016-02-03 MED ORDER — BUPIVACAINE HCL (PF) 0.5 % IJ SOLN
INTRAMUSCULAR | Status: DC | PRN
  Administered 2016-02-03: 10 mL

## 2016-02-03 MED ORDER — MIDAZOLAM HCL 2 MG/2ML IJ SOLN
INTRAMUSCULAR | Status: DC | PRN
Start: 2016-02-03 — End: 2016-02-03
  Administered 2016-02-03: 1 mg via INTRAVENOUS

## 2016-02-03 MED ORDER — OXYCODONE HCL 5 MG PO TABS
10.0000 mg | ORAL_TABLET | ORAL | Status: DC | PRN
  Administered 2016-02-03: 10 mg via ORAL
  Administered 2016-02-03: 5 mg via ORAL
  Administered 2016-02-04 (×3): 10 mg via ORAL
  Filled 2016-02-03 (×4): qty 2

## 2016-02-03 MED ORDER — DIPHENHYDRAMINE HCL 25 MG PO CAPS: 25.0000 mg | ORAL_CAPSULE | Freq: Four times a day (QID) | ORAL | Status: DC | PRN

## 2016-02-03 MED ORDER — SIMETHICONE 80 MG PO CHEW: 80.0000 mg | CHEWABLE_TABLET | ORAL | Status: DC | PRN

## 2016-02-03 MED ORDER — DEXAMETHASONE SODIUM PHOSPHATE 10 MG/ML IJ SOLN
INTRAMUSCULAR | Status: DC | PRN
  Administered 2016-02-03: 4 mg via INTRAVENOUS

## 2016-02-03 MED ORDER — ZOLPIDEM TARTRATE 5 MG PO TABS: 5.0000 mg | ORAL_TABLET | Freq: Every evening | ORAL | Status: DC | PRN

## 2016-02-03 MED ORDER — BENZOCAINE-MENTHOL 20-0.5 % EX AERO: 1.0000 "application " | INHALATION_SPRAY | CUTANEOUS | Status: DC | PRN

## 2016-02-03 MED ORDER — PROPOFOL 10 MG/ML IV BOLUS
INTRAVENOUS | Status: DC | PRN
  Administered 2016-02-03: 140 mg via INTRAVENOUS

## 2016-02-03 MED ORDER — NEOSTIGMINE METHYLSULFATE 10 MG/10ML IV SOLN
INTRAVENOUS | Status: DC | PRN
  Administered 2016-02-03: 3 mg via INTRAVENOUS

## 2016-02-03 MED ORDER — LACTATED RINGERS IV SOLN
INTRAVENOUS | Status: DC | PRN
  Administered 2016-02-03: 12:00:00 via INTRAVENOUS

## 2016-02-03 MED ORDER — PRENATAL MULTIVITAMIN CH: 1.0000 | ORAL_TABLET | Freq: Every day | ORAL | Status: DC

## 2016-02-03 SURGICAL SUPPLY — 28 items
BLADE SURG SZ11 CARB STEEL (BLADE) ×2 IMPLANT
CHLORAPREP W/TINT 26ML (MISCELLANEOUS) ×1 IMPLANT
DRAPE LAPAROTOMY 100X77 ABD (DRAPES) ×1 IMPLANT
DRSG TEGADERM 2-3/8X2-3/4 SM (GAUZE/BANDAGES/DRESSINGS) ×1 IMPLANT
GAUZE SPONGE NON-WVN 2X2 STRL (MISCELLANEOUS) ×1 IMPLANT
GLOVE BIO SURGEON STRL SZ 6.5 (GLOVE) ×1 IMPLANT
GLOVE INDICATOR 7.0 STRL GRN (GLOVE) ×1 IMPLANT
GOWN STRL REUS W/ TWL LRG LVL3 (GOWN DISPOSABLE) IMPLANT
GOWN STRL REUS W/TWL LRG LVL3 (GOWN DISPOSABLE) ×4
KIT RM TURNOVER CYSTO AR (KITS) ×2 IMPLANT
LABEL OR SOLS (LABEL) ×1 IMPLANT
LIQUID BAND (GAUZE/BANDAGES/DRESSINGS) ×1 IMPLANT
NDL HYPO 25GX1X1/2 BEV (NEEDLE) IMPLANT
NEEDLE HYPO 25GX1X1/2 BEV (NEEDLE) ×2 IMPLANT
NS IRRIG 500ML POUR BTL (IV SOLUTION) ×1 IMPLANT
PACK BASIN MINOR ARMC (MISCELLANEOUS) ×1 IMPLANT
SPONGE VERSALON 2X2 STRL (MISCELLANEOUS) ×2
SUT MNCRL 4-0 (SUTURE) ×2
SUT MNCRL 4-0 27XMFL (SUTURE) ×1
SUT PLAIN GUT 0 (SUTURE) ×1 IMPLANT
SUT VIC AB 0 CT1 36 (SUTURE) ×2 IMPLANT
SUT VIC AB 0 SH 27 (SUTURE) ×2 IMPLANT
SUT VIC AB 3-0 SH 27 (SUTURE) ×2
SUT VIC AB 3-0 SH 27X BRD (SUTURE) IMPLANT
SUT VIC AB 4-0 FS2 27 (SUTURE) ×1 IMPLANT
SUT VICRYL 0 AB UR-6 (SUTURE) ×2 IMPLANT
SUTURE MNCRL 4-0 27XMF (SUTURE) ×1 IMPLANT
SYRINGE 10CC LL (SYRINGE) ×1 IMPLANT

## 2016-02-03 NOTE — Anesthesia Procedure Notes (Signed)
Procedure Name: Intubation Performed by: Casey BurkittHOANG, Arita Severtson Pre-anesthesia Checklist: Patient identified, Patient being monitored, Timeout performed, Emergency Drugs available and Suction available Patient Re-evaluated:Patient Re-evaluated prior to inductionOxygen Delivery Method: Circle system utilized Preoxygenation: Pre-oxygenation with 100% oxygen Intubation Type: IV induction Ventilation: Mask ventilation without difficulty Laryngoscope Size: Mac and 3 Grade View: Grade I Tube type: Oral Tube size: 6.5 mm Number of attempts: 1 Airway Equipment and Method: Stylet Placement Confirmation: ETT inserted through vocal cords under direct vision,  positive ETCO2 and breath sounds checked- equal and bilateral Secured at: 20 cm Tube secured with: Tape Dental Injury: Teeth and Oropharynx as per pre-operative assessment

## 2016-02-03 NOTE — Transfer of Care (Signed)
Immediate Anesthesia Transfer of Care Note  Patient: Susan Adkins  Procedure(s) Performed: Procedure(s): BILATERAL TUBAL LIGATION (Bilateral)  Patient Location: PACU  Anesthesia Type:General  Level of Consciousness: awake and responds to stimulation  Airway & Oxygen Therapy: Patient Spontanous Breathing and Patient connected to face mask oxygen  Post-op Assessment: Report given to RN and Post -op Vital signs reviewed and stable  Post vital signs: Reviewed and stable  Last Vitals:  Vitals:   02/03/16 1342 02/03/16 1343  BP: 116/76 116/76  Pulse: 90 81  Resp: (!) 21 19  Temp: 36.6 C     Last Pain:  Vitals:   02/03/16 1135  TempSrc: Oral  PainSc:       Patients Stated Pain Goal: 3 (02/02/16 0039)  Complications: No apparent anesthesia complications

## 2016-02-03 NOTE — Plan of Care (Signed)
Problem: Pain Management: Goal: General experience of comfort will improve and pain level will decrease Outcome: Completed/Met Date Met: 02/03/16 Pain controlled with po meds

## 2016-02-03 NOTE — Progress Notes (Signed)
Post Partum Day # 1, s/p SVD  Subjective: no complaints, up ad lib, voiding and tolerating PO.  Has been NPO since midnight for scheduled procedure.    Objective: Temp:  [98.1 F (36.7 C)-98.6 F (37 C)] 98.1 F (36.7 C) (08/19 1135) Pulse Rate:  [83-136] 103 (08/19 1135) Resp:  [16-18] 16 (08/19 1135) BP: (80-177)/(37-84) 120/81 (08/19 1135) SpO2:  [98 %-100 %] 100 % (08/19 1135)  Physical Exam:  General: alert and no distress  Lungs: clear to auscultation bilaterally Breasts: normal appearance, no masses or tenderness Heart: regular rate and rhythm, S1, S2 normal, no murmur, click, rub or gallop Pelvis: Lochia: appropriate, Uterine Fundus: firm Extremities: DVT Evaluation: Negative Homan's sign. No cords or calf tenderness. No significant calf/ankle edema.   Recent Labs  02/01/16 2038 02/03/16 0604  HGB 13.5 12.9  HCT 39.5 37.2    Assessment/Plan: Plan for discharge tomorrow, Breastfeeding and Contraception postpartum BTL scheduled for today.  Other reversible forms of contraception were discussed with patient; she declines all other modalities. Risks of procedure discussed with patient including but not limited to: risk of regret, permanence of method, bleeding, infection, injury to surrounding organs and need for additional procedures.  Failure risk of 1-2 % with increased risk of ectopic gestation if pregnancy occurs was also discussed with patient.  Patient verbalized understanding of these risks and wants to proceed with sterilization.  Discussed nature of the procedure.  Written informed consent obtained.  To OR when ready.    LOS: 2 days   Hildred LaserAnika Gwynevere Lizana [facility]

## 2016-02-03 NOTE — Anesthesia Postprocedure Evaluation (Signed)
Anesthesia Post Note  Patient: Susan Adkins  Procedure(s) Performed: Procedure(s) (LRB): BILATERAL TUBAL LIGATION (Bilateral)  Patient location during evaluation: PACU Anesthesia Type: General Level of consciousness: awake and alert Pain management: pain level controlled Vital Signs Assessment: post-procedure vital signs reviewed and stable Respiratory status: spontaneous breathing, nonlabored ventilation, respiratory function stable and patient connected to nasal cannula oxygen Cardiovascular status: blood pressure returned to baseline and stable Postop Assessment: no signs of nausea or vomiting Anesthetic complications: no    Last Vitals:  Vitals:   02/03/16 1453 02/03/16 1612  BP: 117/76 123/77  Pulse: 83 70  Resp: 18 18  Temp: 37.1 C 36.9 C    Last Pain:  Vitals:   02/03/16 1612  TempSrc: Oral  PainSc: 8                  Davontay Watlington S

## 2016-02-03 NOTE — Progress Notes (Signed)
Pt returned from surgery. She is arousable to voice and oriented x 4. VSS: BP 117/76 (BP Location: Right Arm)   Pulse 83   Temp 98.7 F (37.1 C) (Oral)   Resp 18   Ht 5\' 2"  (1.575 m)   Wt 162 lb (73.5 kg)   LMP 05/12/2015   SpO2 98%   BMI 29.63 kg/m . Incision site is covered by clean, dry dressing. Pt is in no acute distress with family at bedside.

## 2016-02-03 NOTE — Anesthesia Preprocedure Evaluation (Signed)
Anesthesia Evaluation  Patient identified by MRN, date of birth, ID band Patient awake    Reviewed: Allergy & Precautions, NPO status , Patient's Chart, lab work & pertinent test results, reviewed documented beta blocker date and time   Airway Mallampati: II  TM Distance: >3 FB     Dental  (+) Chipped   Pulmonary asthma ,           Cardiovascular      Neuro/Psych  Headaches, Anxiety    GI/Hepatic   Endo/Other    Renal/GU      Musculoskeletal   Abdominal   Peds  Hematology   Anesthesia Other Findings Hx of fainting.  Reproductive/Obstetrics                             Anesthesia Physical Anesthesia Plan  ASA: II  Anesthesia Plan: General   Post-op Pain Management:    Induction: Intravenous  Airway Management Planned: Oral ETT  Additional Equipment:   Intra-op Plan:   Post-operative Plan:   Informed Consent: I have reviewed the patients History and Physical, chart, labs and discussed the procedure including the risks, benefits and alternatives for the proposed anesthesia with the patient or authorized representative who has indicated his/her understanding and acceptance.     Plan Discussed with: CRNA  Anesthesia Plan Comments:         Anesthesia Quick Evaluation

## 2016-02-03 NOTE — Op Note (Signed)
Procedure(s): BILATERAL TUBAL LIGATION Procedure Note  Susan Adkins female 25 y.o. 02/03/2016  Indications: The patient is a 25 y.o. 812P1001 female PPD#1 s/p SVD, desiring permanent sterilization.   Pre-operative Diagnosis: Multiparity, desiring postpartum permanent sterilization s/p SVD  Post-operative Diagnosis: Same  Surgeon: Hildred LaserAnika Yennifer Segovia, MD  Assistants: Surgical Scrub Tech  Anesthesia: General endotracheal anesthesia  Findings: The uterus was approximately 1 cm below umbilicus. Fallopian tubes and ovaries appeared normal.   Procedure Details: The patient was seen in the Holding Room. The risks, benefits, complications, treatment options, and expected outcomes were discussed with the patient.  The patient concurred with the proposed plan, giving informed consent.  The site of surgery properly noted/marked. The patient was taken to the Operating Room, identified as Susan Adkins and the procedure verified as Procedure(s) (LRB): BILATERAL TUBAL LIGATION (Bilateral).   The patient was taken to the operating room where she received spinal anesthesia without difficulty.  She was then placed in the dorsal supine position and prepped and draped in sterile fashion.  Testing of anesthesia was noted to be adequate. After an adequate timeout was performed, attention was turned to the patient's abdomen where a linear area of the skin underneath the umbilicus was injected with ~ 10 ml of 0.5% Bupivicaine.  A small transverse skin incision was made under the umbilical fold. The incision was taken down to the layer of fascia using the scalpel, and fascia was incised, and extended bilaterally using Mayo scissors. The peritoneum was entered in a sharp fashion. Attention was then turned to the patient's uterus, and right fallopian tube was identified and followed out to the fimbriated end.  The Babcock clamp was then used to grasp the tube approximately 4 cm from the cornual region.  A 3 cm  segment of tube was then ligated with a free tie of 0-Chromic using the Pomeroy method and excised.  The left fallopian tube was then ligated in a similar fashion and excised. The tubal lumens were cauterized bilaterally.  Good hemostasis was noted with bilateral fallopian tubes.   The instruments were then removed from the patient's abdomen and the fascial incision was repaired with 0-Vicryl.  The subcutaneous fat layer was closed with 3-0 Vicryl.  Tthe skin was closed with a 4-0 Vicryl subcuticular stitch. The patient tolerated the procedure well.  Instrument, sponge, and needle counts were correct times two.  The patient was then taken to the recovery room awake and in stable condition.  Estimated Blood Loss:  minimal      Drains: None. Patient voided prior to procedure.          Total IV Fluids:  500 ml  Specimens: Segments of left and right fallopian tubes         Implants: None         Complications:  None; patient tolerated the procedure well.         Disposition: PACU - hemodynamically stable.         Condition: stable   Hildred LaserAnika Jeanett Antonopoulos, MD [facility]

## 2016-02-04 ENCOUNTER — Encounter: Payer: Self-pay | Admitting: *Deleted

## 2016-02-04 LAB — CBC
HEMATOCRIT: 34.9 % — AB (ref 35.0–47.0)
HEMOGLOBIN: 12.1 g/dL (ref 12.0–16.0)
MCH: 31.6 pg (ref 26.0–34.0)
MCHC: 34.6 g/dL (ref 32.0–36.0)
MCV: 91.4 fL (ref 80.0–100.0)
Platelets: 206 10*3/uL (ref 150–440)
RBC: 3.81 MIL/uL (ref 3.80–5.20)
RDW: 12.2 % (ref 11.5–14.5)
WBC: 12.5 10*3/uL — ABNORMAL HIGH (ref 3.6–11.0)

## 2016-02-04 MED ORDER — IBUPROFEN 600 MG PO TABS: 600.0000 mg | ORAL_TABLET | Freq: Four times a day (QID) | ORAL | 0 refills | Status: DC

## 2016-02-04 MED ORDER — DOCUSATE SODIUM 100 MG PO CAPS: 100.0000 mg | ORAL_CAPSULE | Freq: Every day | ORAL | 0 refills | Status: DC

## 2016-02-04 MED ORDER — OXYCODONE HCL 5 MG PO TABS: 5.0000 mg | ORAL_TABLET | ORAL | 0 refills | Status: DC | PRN

## 2016-02-04 MED ORDER — VITAMIN D3 125 MCG (5000 UT) PO CAPS: 1.0000 | ORAL_CAPSULE | Freq: Every day | ORAL | 2 refills | Status: DC

## 2016-02-04 NOTE — Discharge Summary (Signed)
OB Discharge Summary     Patient Name: Susan Adkins DOB: 05/19/1991 MRN: [id number]  Date of admission: 02/01/2016 Delivering MD: Purcell NailsSHAMBLEY, Vaani Morren N   Date of discharge: 02/04/2016  Admitting diagnosis: water broke bilateral tubal  Intrauterine pregnancy: 7152w2d     Secondary diagnosis:  Active Problems:   Premature rupture of membranes  Additional problems: none     Discharge diagnosis: Term Pregnancy Delivered                                                                                                Post partum procedures:postpartum tubal ligation  Augmentation: Pitocin  Complications: None  Hospital course:  Onset of Labor With Vaginal Delivery     25 y.o. yo G2P1001 at 8452w2d was admitted in Latent Labor on 02/01/2016. Patient had an uncomplicated labor course as follows:  Membrane Rupture Time/Date: 6:30 PM ,02/01/2016   Intrapartum Procedures: Episiotomy:                                           Lacerations:     Patient had a delivery of a Viable infant. 02/02/2016  Information for the patient's newborn:  Jerrye NobleXXXTickle, Girl Naveyah [[id number]][[id number]]  Delivery Method: Vag-Spont    Pateint had an uncomplicated postpartum course.  She is ambulating, tolerating a regular diet, passing flatus, and urinating well. Patient is discharged home in stable condition on 02/04/16.    Physical exam Vitals:   02/03/16 1453 02/03/16 1612 02/03/16 2012 02/04/16 0738  BP: 117/76 123/77 117/81 117/84  Pulse: 83 70 69 79  Resp: 18 18 18 16   Temp: 98.7 F (37.1 C) 98.4 F (36.9 C) 98.2 F (36.8 C) 97.9 F (36.6 C)  TempSrc: Oral Oral Oral Oral  SpO2: 98% 99% 99% 99%  Weight:      Height:       General: alert Lochia: appropriate Uterine Fundus: firm Incision: Healing well with no significant drainage, No significant erythema DVT Evaluation: No evidence of DVT seen on physical exam. Negative Homan's sign. Labs: Lab Results  Component Value Date   WBC 12.5 (H) 02/04/2016   HGB 12.1 02/04/2016   HCT 34.9 (L) 02/04/2016   MCV 91.4 02/04/2016   PLT 206 02/04/2016   CMP Latest Ref Rng & Units 09/10/2012  Glucose 70 - 99 mg/dL 83  BUN 6 - 23 mg/dL 11  Creatinine 0.4 - 1.2 mg/dL 0.8  Sodium 086135 - 578145 mEq/L 136  Potassium 3.5 - 5.1 mEq/L 3.8  Chloride 96 - 112 mEq/L 101  CO2 19 - 32 mEq/L 27  Calcium 8.4 - 10.5 mg/dL 9.5  Total Protein 6.0 - 8.3 g/dL 7.5  Total Bilirubin 0.3 - 1.2 mg/dL 0.8  Alkaline Phos 39 - 117 U/L 87  AST 0 - 37 U/L 24  ALT 0 - 35 U/L 19    Discharge instruction: per After Visit Summary and "Baby and Me Booklet".  After visit meds:    Medication List    STOP  taking these medications   Doxylamine-Pyridoxine 10-10 MG Tbec Commonly known as:  DICLEGIS   hydrOXYzine 25 MG tablet Commonly known as:  ATARAX/VISTARIL   nitrofurantoin (macrocrystal-monohydrate) 100 MG capsule Commonly known as:  MACROBID   promethazine 12.5 MG tablet Commonly known as:  PHENERGAN     TAKE these medications   docusate sodium 100 MG capsule Commonly known as:  COLACE Take 1 capsule (100 mg total) by mouth daily.   ibuprofen 600 MG tablet Commonly known as:  ADVIL,MOTRIN Take 1 tablet (600 mg total) by mouth every 6 (six) hours.   oxyCODONE 5 MG immediate release tablet Commonly known as:  Oxy IR/ROXICODONE Take 1 tablet (5 mg total) by mouth every 4 (four) hours as needed (pain scale 4-7).   PRENATAL VITAMINS PO Take by mouth.   Vitamin D3 5000 units Caps Take 1 capsule (5,000 Units total) by mouth daily.       Diet: routine diet  Activity: Advance as tolerated. Pelvic rest for 6 weeks.   Outpatient follow up:6 weeks Follow up Appt:No future appointments. Follow up Visit:No Follow-up on file.  Postpartum contraception: Tubal Ligation  Newborn Data: Live born female  Birth Weight: 6 lb 10.5 oz (3020 g) APGAR: 8, 9  Baby Feeding: Breast Disposition:home with mother   02/04/2016 Purcell NailsMelody N Everitt Wenner, CNM

## 2016-02-04 NOTE — Anesthesia Postprocedure Evaluation (Signed)
Anesthesia Post Note  Patient: Susan Adkins  Procedure(s) Performed: * No procedures listed *  Patient location during evaluation: Mother Baby Anesthesia Type: Epidural Level of consciousness: awake and alert Pain management: pain level controlled Vital Signs Assessment: post-procedure vital signs reviewed and stable Respiratory status: spontaneous breathing, nonlabored ventilation and respiratory function stable Cardiovascular status: stable Postop Assessment: no headache, no backache and epidural receding Anesthetic complications: no    Last Vitals:  Vitals:   02/03/16 2012 02/04/16 0738  BP: 117/81 117/84  Pulse: 69 79  Resp: 18 16  Temp: 36.8 C 36.6 C    Last Pain:  Vitals:   02/04/16 1347  TempSrc:   PainSc: 7                  Tesla Bochicchio S

## 2016-02-05 ENCOUNTER — Encounter: Payer: Self-pay | Admitting: Obstetrics and Gynecology

## 2016-02-06 ENCOUNTER — Other Ambulatory Visit: Payer: Self-pay | Admitting: *Deleted

## 2016-02-06 ENCOUNTER — Telehealth: Payer: Self-pay | Admitting: Obstetrics and Gynecology

## 2016-02-06 LAB — SURGICAL PATHOLOGY

## 2016-02-06 MED ORDER — DICLOXACILLIN SODIUM 500 MG PO CAPS: 500.0000 mg | ORAL_CAPSULE | Freq: Four times a day (QID) | ORAL | 0 refills | Status: DC

## 2016-02-06 NOTE — Telephone Encounter (Signed)
Pt deliverd 4 days ago. She has been running a fever of 100 for 2 days. Just wondered what to do.

## 2016-02-06 NOTE — Telephone Encounter (Signed)
Called pt her fever has gone to 102, she is having chills,breast are warm to touch, advised pt to feed on affected side, tylenol, cold compress to area, medication sent into pharmacy, pt knows to call our office if anything changes

## 2016-02-13 ENCOUNTER — Encounter: Payer: Medicaid Other | Admitting: Obstetrics and Gynecology

## 2016-02-28 ENCOUNTER — Telehealth: Payer: Self-pay | Admitting: Obstetrics and Gynecology

## 2016-02-28 ENCOUNTER — Other Ambulatory Visit: Payer: Self-pay | Admitting: *Deleted

## 2016-02-28 MED ORDER — DICLOXACILLIN SODIUM 500 MG PO CAPS: 500.0000 mg | ORAL_CAPSULE | Freq: Four times a day (QID) | ORAL | 0 refills | Status: DC

## 2016-02-28 NOTE — Telephone Encounter (Signed)
Done-ac 

## 2016-02-28 NOTE — Telephone Encounter (Signed)
Pt called and stated that she has mastitis again and wanted to know if the same medication that was called in last time could be called in again.

## 2016-03-15 ENCOUNTER — Encounter: Payer: Self-pay | Admitting: Obstetrics and Gynecology

## 2016-03-15 ENCOUNTER — Ambulatory Visit (INDEPENDENT_AMBULATORY_CARE_PROVIDER_SITE_OTHER): Payer: Medicaid Other | Admitting: Obstetrics and Gynecology

## 2016-03-15 DIAGNOSIS — Z23 Encounter for immunization: Secondary | ICD-10-CM

## 2016-03-15 DIAGNOSIS — N912 Amenorrhea, unspecified: Secondary | ICD-10-CM

## 2016-03-15 NOTE — Progress Notes (Signed)
   Subjective:     Susan Adkins is a 25 y.o. female who presents for a postpartum visit. She is 6 weeks postpartum following a spontaneous vaginal delivery. I have fully reviewed the prenatal and intrapartum course. The delivery was at 37 gestational weeks. Outcome: spontaneous vaginal delivery. Anesthesia: epidural. Postpartum course has been uncomplicated. Baby's course has been uncomplicated. Baby is feeding by breast. Bleeding no bleeding. Bowel function is normal. Bladder function is normal. Patient is not sexually active. Contraception method is tubal ligation. Postpartum depression screening: negative.  The following portions of the patient's history were reviewed and updated as appropriate: allergies, current medications, past family history, past medical history, past social history, past surgical history and problem list.  Review of Systems A comprehensive review of systems was negative.   Objective:    BP 114/83   Pulse 74   Ht 5\' 2"  (1.575 m)   Wt 141 lb 8 oz (64.2 kg)   Breastfeeding? Yes   BMI 25.88 kg/m   General:  alert, cooperative and appears stated age   Breasts:  inspection negative, no nipple discharge or bleeding, no masses or nodularity palpable  Lungs: clear to auscultation bilaterally  Heart:  regular rate and rhythm, S1, S2 normal, no murmur, click, rub or gallop  Abdomen: soft, non-tender; bowel sounds normal; no masses,  no organomegaly   Vulva:  normal  Vagina: normal vagina  Cervix:  multiparous appearance  Corpus: normal size, contour, position, consistency, mobility, non-tender  Adnexa:  no mass, fullness, tenderness  Rectal Exam: no masses, tenderness, nodules        Assessment:     6 weeks postpartum exam. Pap smear not done at today's visit.   Plan:    1. Contraception: tubal ligation 2.  Lactational amenorrhea  3. Follow up in: 4 months or as needed.

## 2016-03-15 NOTE — Patient Instructions (Signed)
  Place postpartum visit patient instructions here.  

## 2016-06-27 ENCOUNTER — Encounter: Payer: Medicaid Other | Admitting: Obstetrics and Gynecology

## 2016-11-14 ENCOUNTER — Encounter: Payer: Self-pay | Admitting: Obstetrics and Gynecology

## 2016-11-14 ENCOUNTER — Other Ambulatory Visit: Payer: Self-pay | Admitting: Obstetrics and Gynecology

## 2016-11-14 ENCOUNTER — Ambulatory Visit (INDEPENDENT_AMBULATORY_CARE_PROVIDER_SITE_OTHER): Payer: Commercial Managed Care - PPO | Admitting: Obstetrics and Gynecology

## 2016-11-14 VITALS — BP 118/78 | HR 88 | Ht 62.0 in | Wt 115.6 lb

## 2016-11-14 DIAGNOSIS — Z01419 Encounter for gynecological examination (general) (routine) without abnormal findings: Secondary | ICD-10-CM

## 2016-11-14 DIAGNOSIS — M2550 Pain in unspecified joint: Secondary | ICD-10-CM

## 2016-11-14 DIAGNOSIS — N912 Amenorrhea, unspecified: Secondary | ICD-10-CM | POA: Diagnosis not present

## 2016-11-14 MED ORDER — ESCITALOPRAM OXALATE 10 MG PO TABS: 10.0000 mg | ORAL_TABLET | Freq: Every day | ORAL | 6 refills | Status: DC

## 2016-11-14 NOTE — Progress Notes (Signed)
   Subjective:     Susan Adkins is a married white 26 y.o. female and is here for a comprehensive physical exam. The patient reports problems - hair loss for second time since delivery, generalized joint pain in extremeties, fatigue, anxiety returned, and lower pelvic cramping since delivery with no menses in a few months..  Social History   Social History  . Marital status: Married    Spouse name: Barron SchmidSteven Warnock  . Number of children: 0  . Years of education: 12   Occupational History  . [employer]    Social History Main Topics  . Smoking status: Never Smoker  . Smokeless tobacco: Never Used  . Alcohol use 0.5 oz/week    1 Standard drinks or equivalent per week  . Drug use: No  . Sexual activity: Yes    Partners: Male   Other Topics Concern  . Not on file   Social History Narrative  . No narrative on file   Health Maintenance  Topic Date Due  . PAP SMEAR  08/20/2012  . INFLUENZA VACCINE  01/15/2017  . TETANUS/TDAP  11/23/2025  . HIV Screening  Completed    The following portions of the patient's history were reviewed and updated as appropriate: allergies, current medications, past family history, past medical history, past social history, past surgical history and problem list.  Review of Systems A comprehensive review of systems was negative.   Objective:    General appearance: alert, cooperative and appears stated age Neck: no adenopathy, no carotid bruit, no JVD, supple, symmetrical, trachea midline and thyroid not enlarged, symmetric, no tenderness/mass/nodules Lungs: clear to auscultation bilaterally Breasts: normal appearance, no masses or tenderness Heart: regular rate and rhythm, S1, S2 normal, no murmur, click, rub or gallop Abdomen: soft, non-tender; bowel sounds normal; no masses,  no organomegaly Pelvic: external genitalia normal, no adnexal masses or tenderness, no cervical motion tenderness, pregnancy positive findings: urine tests  urinalysis: negative, uterine enlargement: cervix bluish in color wk size, 6, anteflexed, rectovaginal septum normal, uterus normal size, shape, and consistency and vagina normal without discharge    Assessment:    Healthy female exam. Fatigue Generalized anxiety Joint pain in extremeties Hair loss Intermittent cramping Lactational amenorrhea     Plan:  Labs obtained and will follow up accordingly To restart SSRI- lexapro 10mg  daily RTC 1 year as needed.  Owin Vignola Aura CampsShambley, CNM   See After Visit Summary for Counseling Recommendations

## 2016-11-15 ENCOUNTER — Other Ambulatory Visit: Payer: Self-pay | Admitting: Obstetrics and Gynecology

## 2016-11-15 DIAGNOSIS — E559 Vitamin D deficiency, unspecified: Secondary | ICD-10-CM | POA: Insufficient documentation

## 2016-11-15 LAB — COMPREHENSIVE METABOLIC PANEL
A/G RATIO: 1.9 (ref 1.2–2.2)
ALT: 11 IU/L (ref 0–32)
AST: 11 IU/L (ref 0–40)
Albumin: 4.7 g/dL (ref 3.5–5.5)
Alkaline Phosphatase: 117 IU/L (ref 39–117)
BUN/Creatinine Ratio: 15 (ref 9–23)
BUN: 12 mg/dL (ref 6–20)
Bilirubin Total: 0.4 mg/dL (ref 0.0–1.2)
CALCIUM: 9.8 mg/dL (ref 8.7–10.2)
CO2: 22 mmol/L (ref 18–29)
Chloride: 104 mmol/L (ref 96–106)
Creatinine, Ser: 0.82 mg/dL (ref 0.57–1.00)
GFR, EST AFRICAN AMERICAN: 115 mL/min/{1.73_m2} (ref >59)
GFR, EST NON AFRICAN AMERICAN: 100 mL/min/{1.73_m2} (ref >59)
Globulin, Total: 2.5 g/dL (ref 1.5–4.5)
Glucose: 84 mg/dL (ref 65–99)
Potassium: 4.5 mmol/L (ref 3.5–5.2)
Sodium: 140 mmol/L (ref 134–144)
TOTAL PROTEIN: 7.2 g/dL (ref 6.0–8.5)

## 2016-11-15 LAB — CBC
Hematocrit: 44.1 % (ref 34.0–46.6)
Hemoglobin: 14.6 g/dL (ref 11.1–15.9)
MCH: 30.7 pg (ref 26.6–33.0)
MCHC: 33.1 g/dL (ref 31.5–35.7)
MCV: 93 fL (ref 79–97)
Platelets: 241 x10E3/uL (ref 150–379)
RBC: 4.76 x10E6/uL (ref 3.77–5.28)
RDW: 12.1 % — ABNORMAL LOW (ref 12.3–15.4)
WBC: 5.3 x10E3/uL (ref 3.4–10.8)

## 2016-11-15 LAB — VITAMIN D 25 HYDROXY (VIT D DEFICIENCY, FRACTURES): Vit D, 25-Hydroxy: 23.3 ng/mL — ABNORMAL LOW (ref 30.0–100.0)

## 2016-11-15 LAB — THYROID PANEL WITH TSH
Free Thyroxine Index: 1.7 (ref 1.2–4.9)
T3 Uptake Ratio: 26 % (ref 24–39)
T4 TOTAL: 6.7 ug/dL (ref 4.5–12.0)
TSH: 3.79 u[IU]/mL (ref 0.450–4.500)

## 2016-11-15 LAB — FERRITIN: Ferritin: 32 ng/mL (ref 15–150)

## 2016-11-15 LAB — BETA HCG QUANT (REF LAB): hCG Quant: <1 m[IU]/mL

## 2016-11-15 MED ORDER — VITAMIN D (ERGOCALCIFEROL) 1.25 MG (50000 UNIT) PO CAPS: 50000.0000 [IU] | ORAL_CAPSULE | ORAL | 1 refills | Status: DC

## 2016-11-18 LAB — CYTOLOGY - PAP

## 2016-12-10 ENCOUNTER — Encounter: Payer: Self-pay | Admitting: Obstetrics and Gynecology

## 2016-12-23 ENCOUNTER — Encounter: Payer: Self-pay | Admitting: Obstetrics and Gynecology

## 2016-12-23 ENCOUNTER — Other Ambulatory Visit: Payer: Self-pay | Admitting: *Deleted

## 2016-12-23 MED ORDER — ESCITALOPRAM OXALATE 20 MG PO TABS: 10.0000 mg | ORAL_TABLET | Freq: Every day | ORAL | 6 refills | Status: DC

## 2016-12-30 ENCOUNTER — Other Ambulatory Visit: Payer: Self-pay | Admitting: *Deleted

## 2016-12-30 MED ORDER — ESCITALOPRAM OXALATE 20 MG PO TABS: 20.0000 mg | ORAL_TABLET | Freq: Every day | ORAL | 6 refills | Status: DC

## 2017-01-21 ENCOUNTER — Other Ambulatory Visit: Payer: Self-pay | Admitting: *Deleted

## 2017-01-21 ENCOUNTER — Telehealth: Payer: Self-pay | Admitting: Obstetrics and Gynecology

## 2017-01-21 MED ORDER — DICLOXACILLIN SODIUM 500 MG PO CAPS: 500.0000 mg | ORAL_CAPSULE | Freq: Four times a day (QID) | ORAL | 1 refills | Status: DC

## 2017-01-21 NOTE — Telephone Encounter (Signed)
Called pt.

## 2017-01-21 NOTE — Telephone Encounter (Signed)
Patient has mastitis left breast again - could you call in the same antibiotic to  [facility]   Please call patient and let her know so she can pick up

## 2017-03-09 ENCOUNTER — Encounter: Payer: Self-pay | Admitting: Emergency Medicine

## 2017-03-09 ENCOUNTER — Emergency Department
Admission: EM | Admit: 2017-03-09 | Discharge: 2017-03-09 | Disposition: A | Discharge status: Home / Self Care | Payer: Commercial Managed Care - PPO | Attending: Emergency Medicine | Admitting: Emergency Medicine

## 2017-03-09 DIAGNOSIS — J45909 Unspecified asthma, uncomplicated: Secondary | ICD-10-CM | POA: Insufficient documentation

## 2017-03-09 DIAGNOSIS — Z79899 Other long term (current) drug therapy: Secondary | ICD-10-CM | POA: Diagnosis not present

## 2017-03-09 DIAGNOSIS — R51 Headache: Secondary | ICD-10-CM | POA: Diagnosis present

## 2017-03-09 DIAGNOSIS — G43809 Other migraine, not intractable, without status migrainosus: Secondary | ICD-10-CM | POA: Diagnosis not present

## 2017-03-09 LAB — POCT PREGNANCY, URINE: PREG TEST UR: NEGATIVE

## 2017-03-09 MED ORDER — METHYLPREDNISOLONE SODIUM SUCC 125 MG IJ SOLR
125.0000 mg | Freq: Once | INTRAMUSCULAR | Status: AC
  Administered 2017-03-09: 125 mg via INTRAVENOUS
  Filled 2017-03-09: qty 2

## 2017-03-09 MED ORDER — SODIUM CHLORIDE 0.9 % IV BOLUS (SEPSIS)
1000.0000 mL | Freq: Once | INTRAVENOUS | Status: AC
  Administered 2017-03-09: 1000 mL via INTRAVENOUS

## 2017-03-09 MED ORDER — KETOROLAC TROMETHAMINE 30 MG/ML IJ SOLN
30.0000 mg | Freq: Once | INTRAMUSCULAR | Status: AC
Start: 2017-03-09 — End: 2017-03-09
  Administered 2017-03-09: 30 mg via INTRAVENOUS
  Filled 2017-03-09: qty 1

## 2017-03-09 MED ORDER — PROCHLORPERAZINE EDISYLATE 5 MG/ML IJ SOLN
10.0000 mg | Freq: Once | INTRAMUSCULAR | Status: AC
  Administered 2017-03-09: 10 mg via INTRAVENOUS
  Filled 2017-03-09: qty 2

## 2017-03-09 MED ORDER — PROMETHAZINE HCL 25 MG/ML IJ SOLN
12.5000 mg | Freq: Once | INTRAMUSCULAR | Status: AC
  Administered 2017-03-09: 12.5 mg via INTRAVENOUS
  Filled 2017-03-09: qty 1

## 2017-03-09 MED ORDER — MAGNESIUM SULFATE 2 GM/50ML IV SOLN
2.0000 g | Freq: Once | INTRAVENOUS | Status: AC
  Administered 2017-03-09: 2 g via INTRAVENOUS
  Filled 2017-03-09: qty 50

## 2017-03-09 NOTE — ED Provider Notes (Signed)
[facility] Emergency Department Provider Note   ____________________________________________   I have reviewed the triage vital signs and the nursing notes.   HISTORY  Chief Complaint Migraine   History limited by: Not Limited   HPI Susan Adkins is a 26 y.o. female who presents to the emergency department today because of concerns for migraine. Patient states she has a history of migraines. His had for a number of years. The headache started 2 days ago. It is located behind her forehead. She describes it as severe. It is pressure-like. It has been associated with nausea and vomiting. She has tried over-the-counter medications without relief. His also associated with some spots in her vision. She states typically her headaches better after she sleeps but it did not occur this time. Did see a migraine specialist once when she was a child however has not seen one recently. She denies any recent illness or fever.    Past Medical History:  Diagnosis Date  . Asthma    Well controlled.   . Chicken pox   . Fainting episodes   . Migraines    Well controlled on amitriptyline   . UTI (lower urinary tract infection)     Patient Active Problem List   Diagnosis Date Noted  . Vitamin D deficiency 11/15/2016  . Migraine 09/10/2012  . Anxiety 09/10/2012    Past Surgical History:  Procedure Laterality Date  . TONSILLECTOMY  2005  . TUBAL LIGATION Bilateral 02/03/2016   Procedure: BILATERAL TUBAL LIGATION;  Surgeon: Hildred Laser, MD;  Location: [facility] ORS;  Service: Gynecology;  Laterality: Bilateral;    Prior to Admission medications   Medication Sig Start Date End Date Taking? Authorizing Provider  dicloxacillin (DYNAPEN) 500 MG capsule Take 1 capsule (500 mg total) by mouth 4 (four) times daily. 01/21/17   Shambley, Melody N, CNM  escitalopram (LEXAPRO) 20 MG tablet Take 1 tablet (20 mg total) by mouth daily. 12/30/16   Shambley, Melody N, CNM  Vitamin D,  Ergocalciferol, (DRISDOL) 50000 units CAPS capsule Take 1 capsule (50,000 Units total) by mouth every 7 (seven) days. 11/15/16   Purcell Nails, CNM    Allergies Patient has no known allergies.  Family History  Problem Relation Age of Onset  . Interstitial cystitis Mother   . Depression Mother   . Anxiety disorder Mother   . Bipolar disorder Mother   . Hypertension Father   . Cancer Maternal Grandmother 42       Breast cancer  . Depression Maternal Grandmother   . Hypertension Paternal Grandmother     Social History Social History  Substance Use Topics  . Smoking status: Never Smoker  . Smokeless tobacco: Never Used  . Alcohol use 0.5 oz/week    1 Standard drinks or equivalent per week    Review of Systems Constitutional: No fever/chills Eyes: Positive for blind spots in her vision. ENT: No sore throat. Cardiovascular: Denies chest pain. Respiratory: Denies shortness of breath. Gastrointestinal: No abdominal pain.  No nausea, no vomiting.  No diarrhea.   Genitourinary: Negative for dysuria. Musculoskeletal: Negative for back pain. Skin: Negative for rash. Neurological: Positive for headache.   ____________________________________________   PHYSICAL EXAM:  VITAL SIGNS: ED Triage Vitals  Enc Vitals Group     BP 135/89     Pulse 96     Resp 16     Temp 97.9     Temp src      SpO2 100   Constitutional: Alert and  oriented. Well appearing and in no distress. Eyes: Conjunctivae are normal.  ENT   Head: Normocephalic and atraumatic.   Nose: No congestion/rhinnorhea.   Mouth/Throat: Mucous membranes are moist.   Neck: No stridor. Hematological/Lymphatic/Immunilogical: No cervical lymphadenopathy. Cardiovascular: Normal rate, regular rhythm.  No murmurs, rubs, or gallops.  Respiratory: Normal respiratory effort without tachypnea nor retractions. Breath sounds are clear and equal bilaterally. No wheezes/rales/rhonchi. Gastrointestinal: Soft and non  tender. No rebound. No guarding.  Genitourinary: Deferred Musculoskeletal: Normal range of motion in all extremities. No lower extremity edema. Neurologic:  Normal speech and language. No gross focal neurologic deficits are appreciated.  Skin:  Skin is warm, dry and intact. No rash noted. Psychiatric: Mood and affect are normal. Speech and behavior are normal. Patient exhibits appropriate insight and judgment.  ____________________________________________    LABS (pertinent positives/negatives)  Preg neg  ____________________________________________   EKG  None  ____________________________________________    RADIOLOGY  None  ____________________________________________   PROCEDURES  Procedures  ____________________________________________   INITIAL IMPRESSION / ASSESSMENT AND PLAN / ED COURSE  Pertinent labs & imaging results that were available during my care of the patient were reviewed by me and considered in my medical decision making (see chart for details).  patient was seen in the emergency department today because of concerns for migraine. Patient has a history of migraines. This point I think other causes of severe headache since his intracranial bleed or blood clot would be less likely. Patient was given migraine no indication here which did improve her pain down to 2 out of 10. Her vision did improve as well. Will give patient urology follow-up information.  ____________________________________________   FINAL CLINICAL IMPRESSION(S) / ED DIAGNOSES  Final diagnoses:  Other migraine without status migrainosus, not intractable     Note: This dictation was prepared with Dragon dictation. Any transcriptional errors that result from this process are unintentional     Phineas Semen, MD 03/09/17 1547

## 2017-03-09 NOTE — Progress Notes (Signed)
CH responded to a Pt in need. Pt was delivered by EMS with severe migraine. Pt was shivering. CH provided warm blankets. CH brought in the husband and two children. CH checked in with the Pt periodically.    03/09/17 1400  Clinical Encounter Type  Visited With Patient;Patient and family together  Visit Type Initial;Spiritual support;ED  Consult/Referral To Chaplain  Spiritual Encounters  Spiritual Needs Emotional

## 2017-03-09 NOTE — Discharge Instructions (Signed)
Please seek medical attention for any high fevers, chest pain, shortness of breath, change in behavior, persistent vomiting, bloody stool or any other new or concerning symptoms.  

## 2017-03-09 NOTE — ED Notes (Signed)
Pt medicated and made comfortable in the hallway. Hx of migraines but medications taken at home did not work for her today.

## 2017-03-09 NOTE — ED Triage Notes (Signed)
Patient presents to ED via EMS from home with c/o migraine. Hx of same. Patient also reports nausea and chills.

## 2017-03-11 ENCOUNTER — Other Ambulatory Visit: Payer: Self-pay | Admitting: Nurse Practitioner

## 2017-03-11 DIAGNOSIS — G43419 Hemiplegic migraine, intractable, without status migrainosus: Secondary | ICD-10-CM

## 2017-03-17 ENCOUNTER — Ambulatory Visit
Admission: RE | Admit: 2017-03-17 | Discharge: 2017-03-17 | Disposition: A | Discharge status: Home / Self Care | Payer: Commercial Managed Care - PPO | Source: Ambulatory Visit | Attending: Nurse Practitioner | Admitting: Nurse Practitioner

## 2017-03-17 DIAGNOSIS — R9089 Other abnormal findings on diagnostic imaging of central nervous system: Secondary | ICD-10-CM | POA: Insufficient documentation

## 2017-03-17 DIAGNOSIS — G43419 Hemiplegic migraine, intractable, without status migrainosus: Secondary | ICD-10-CM | POA: Insufficient documentation

## 2017-03-17 MED ORDER — GADOBENATE DIMEGLUMINE 529 MG/ML IV SOLN
10.0000 mL | Freq: Once | INTRAVENOUS | Status: AC | PRN
  Administered 2017-03-17: 10 mL via INTRAVENOUS

## 2017-05-12 ENCOUNTER — Telehealth: Payer: Self-pay | Admitting: Obstetrics and Gynecology

## 2017-05-12 NOTE — Telephone Encounter (Signed)
Pt request for Amy or Melody to return her call to discuss her stopping breast feeding about 2 weeks ago but still having trouble with her milk drying. Pt was advise Amy & Melody out of the office today.

## 2017-05-14 NOTE — Telephone Encounter (Signed)
Please advise on binding breast and cabbage leaf use.

## 2017-05-14 NOTE — Telephone Encounter (Signed)
pls advise

## 2017-05-14 NOTE — Telephone Encounter (Signed)
Notified pt of cabbage leaves and binding of the breast

## 2017-08-09 ENCOUNTER — Other Ambulatory Visit: Payer: Self-pay | Admitting: Obstetrics and Gynecology

## 2017-10-01 ENCOUNTER — Other Ambulatory Visit: Payer: Self-pay | Admitting: Nurse Practitioner

## 2017-10-02 ENCOUNTER — Other Ambulatory Visit: Payer: Self-pay | Admitting: Nurse Practitioner

## 2017-10-02 DIAGNOSIS — G43119 Migraine with aura, intractable, without status migrainosus: Secondary | ICD-10-CM

## 2017-10-02 DIAGNOSIS — M5410 Radiculopathy, site unspecified: Secondary | ICD-10-CM

## 2017-10-09 ENCOUNTER — Ambulatory Visit
Admission: RE | Admit: 2017-10-09 | Discharge: 2017-10-09 | Disposition: A | Discharge status: Home / Self Care | Payer: Commercial Managed Care - PPO | Source: Ambulatory Visit | Attending: Nurse Practitioner | Admitting: Nurse Practitioner

## 2017-10-09 DIAGNOSIS — R251 Tremor, unspecified: Secondary | ICD-10-CM | POA: Insufficient documentation

## 2017-10-09 DIAGNOSIS — R5382 Chronic fatigue, unspecified: Secondary | ICD-10-CM | POA: Diagnosis not present

## 2017-10-09 DIAGNOSIS — R5381 Other malaise: Secondary | ICD-10-CM | POA: Insufficient documentation

## 2017-10-09 DIAGNOSIS — M5127 Other intervertebral disc displacement, lumbosacral region: Secondary | ICD-10-CM | POA: Diagnosis not present

## 2017-10-09 DIAGNOSIS — M5410 Radiculopathy, site unspecified: Secondary | ICD-10-CM | POA: Diagnosis not present

## 2017-10-09 DIAGNOSIS — H539 Unspecified visual disturbance: Secondary | ICD-10-CM | POA: Insufficient documentation

## 2017-10-09 DIAGNOSIS — G43119 Migraine with aura, intractable, without status migrainosus: Secondary | ICD-10-CM | POA: Insufficient documentation

## 2017-10-09 MED ORDER — GADOBENATE DIMEGLUMINE 529 MG/ML IV SOLN
10.0000 mL | Freq: Once | INTRAVENOUS | Status: AC | PRN
  Administered 2017-10-09: 10 mL via INTRAVENOUS

## 2017-11-19 ENCOUNTER — Encounter: Payer: Commercial Managed Care - PPO | Admitting: Obstetrics and Gynecology

## 2017-11-20 ENCOUNTER — Ambulatory Visit (INDEPENDENT_AMBULATORY_CARE_PROVIDER_SITE_OTHER): Payer: Commercial Managed Care - PPO | Admitting: Obstetrics and Gynecology

## 2017-11-20 ENCOUNTER — Encounter: Payer: Self-pay | Admitting: Obstetrics and Gynecology

## 2017-11-20 VITALS — HR 72 | Ht 62.0 in | Wt 129.3 lb

## 2017-11-20 DIAGNOSIS — Z01419 Encounter for gynecological examination (general) (routine) without abnormal findings: Secondary | ICD-10-CM | POA: Diagnosis not present

## 2017-11-20 DIAGNOSIS — G43801 Other migraine, not intractable, with status migrainosus: Secondary | ICD-10-CM | POA: Diagnosis not present

## 2017-11-20 MED ORDER — MEDROXYPROGESTERONE ACETATE 150 MG/ML IM SUSP: 150.0000 mg | INTRAMUSCULAR | 4 refills | Status: DC

## 2017-11-20 NOTE — Patient Instructions (Signed)
Preventive Care 18-39 Years, Female Preventive care refers to lifestyle choices and visits with your health care provider that can promote health and wellness. What does preventive care include?  A yearly physical exam. This is also called an annual well check.  Dental exams once or twice a year.  Routine eye exams. Ask your health care provider how often you should have your eyes checked.  Personal lifestyle choices, including: ? Daily care of your teeth and gums. ? Regular physical activity. ? Eating a healthy diet. ? Avoiding tobacco and drug use. ? Limiting alcohol use. ? Practicing safe sex. ? Taking vitamin and mineral supplements as recommended by your health care provider. What happens during an annual well check? The services and screenings done by your health care provider during your annual well check will depend on your age, overall health, lifestyle risk factors, and family history of disease. Counseling Your health care provider may ask you questions about your:  Alcohol use.  Tobacco use.  Drug use.  Emotional well-being.  Home and relationship well-being.  Sexual activity.  Eating habits.  Work and work Statistician.  Method of birth control.  Menstrual cycle.  Pregnancy history.  Screening You may have the following tests or measurements:  Height, weight, and BMI.  Diabetes screening. This is done by checking your blood sugar (glucose) after you have not eaten for a while (fasting).  Blood pressure.  Lipid and cholesterol levels. These may be checked every 5 years starting at age 38.  Skin check.  Hepatitis C blood test.  Hepatitis B blood test.  Sexually transmitted disease (STD) testing.  BRCA-related cancer screening. This may be done if you have a family history of breast, ovarian, tubal, or peritoneal cancers.  Pelvic exam and Pap test. This may be done every 3 years starting at age 38. Starting at age 30, this may be done  every 5 years if you have a Pap test in combination with an HPV test.  Discuss your test results, treatment options, and if necessary, the need for more tests with your health care provider. Vaccines Your health care provider may recommend certain vaccines, such as:  Influenza vaccine. This is recommended every year.  Tetanus, diphtheria, and acellular pertussis (Tdap, Td) vaccine. You may need a Td booster every 10 years.  Varicella vaccine. You may need this if you have not been vaccinated.  HPV vaccine. If you are 39 or younger, you may need three doses over 6 months.  Measles, mumps, and rubella (MMR) vaccine. You may need at least one dose of MMR. You may also need a second dose.  Pneumococcal 13-valent conjugate (PCV13) vaccine. You may need this if you have certain conditions and were not previously vaccinated.  Pneumococcal polysaccharide (PPSV23) vaccine. You may need one or two doses if you smoke cigarettes or if you have certain conditions.  Meningococcal vaccine. One dose is recommended if you are age 68-21 years and a first-year college student living in a residence hall, or if you have one of several medical conditions. You may also need additional booster doses.  Hepatitis A vaccine. You may need this if you have certain conditions or if you travel or work in places where you may be exposed to hepatitis A.  Hepatitis B vaccine. You may need this if you have certain conditions or if you travel or work in places where you may be exposed to hepatitis B.  Haemophilus influenzae type b (Hib) vaccine. You may need this  if you have certain risk factors.  Talk to your health care provider about which screenings and vaccines you need and how often you need them. This information is not intended to replace advice given to you by your health care provider. Make sure you discuss any questions you have with your health care provider. Document Released: 07/30/2001 Document Revised:  02/21/2016 Document Reviewed: 04/04/2015 Elsevier Interactive Patient Education  2018 Elsevier Inc.  

## 2017-11-20 NOTE — Progress Notes (Signed)
  Subjective:     Susan Adkins is a married white  27 y.o. female and is here for a comprehensive physical exam. G2P2, with svd x 2. The patient reports problems - Pt reports migraines that are severe enough to alter daily function. Reports nausea and vomiting with migraines and at time vision changes and elevated BP - She has had no relief from Botox injections so she stoped this treatment.  She states they are worse around menses.  Social History   Socioeconomic History  . Marital status: Married    Spouse name: Bertram Savin  . Number of children: 0  . Years of education: 63  . Highest education level: Not on file  Occupational History  . Occupation: Tree surgeon  Social Needs  . Financial resource strain: Not on file  . Food insecurity:    Worry: Not on file    Inability: Not on file  . Transportation needs:    Medical: Not on file    Non-medical: Not on file  Tobacco Use  . Smoking status: Never Smoker  . Smokeless tobacco: Never Used  Substance and Sexual Activity  . Alcohol use: Yes    Alcohol/week: 0.5 oz    Types: 1 Standard drinks or equivalent per week  . Drug use: No  . Sexual activity: Yes    Partners: Male  Lifestyle  . Physical activity:    Days per week: Not on file    Minutes per session: Not on file  . Stress: Not on file  Relationships  . Social connections:    Talks on phone: Not on file    Gets together: Not on file    Attends religious service: Not on file    Active member of club or organization: Not on file    Attends meetings of clubs or organizations: Not on file    Relationship status: Not on file  . Intimate partner violence:    Fear of current or ex partner: Not on file    Emotionally abused: Not on file    Physically abused: Not on file    Forced sexual activity: Not on file  Other Topics Concern  . Not on file  Social History Narrative  . Not on file   Health Maintenance  Topic Date Due  . INFLUENZA VACCINE   01/15/2018  . PAP SMEAR  11/15/2019  . TETANUS/TDAP  11/23/2025  . HIV Screening  Completed    The following portions of the patient's history were reviewed and updated as appropriate: allergies, current medications, past family history, past medical history, past social history, past surgical history and problem list.  Review of Systems A comprehensive review of systems was negative.   Objective:    General appearance: alert, cooperative and appears stated age   Pulse 72, height '5\' 2"'$  (1.575 m), weight 129 lb 4.8 oz (58.7 kg), last menstrual period 10/15/2017, currently breastfeeding. HRR Lungs clear Abdomen soft and not tender Breasts: breasts appear normal, no suspicious masses, no skin or nipple changes or axillary nodes. Pelvic exam: normal external genitalia, vulva, vagina, cervix, uterus and adnexa. Assessment:    Healthy female exam. migraines     Plan:  Will do trial of Depo for menstrual suppression and treatment for migraines- RX sent in and will call when she starts upcoming menses to get first shot.  RTC 1 year or as needed.  Kurstin Dimarzo,CNM   See After Visit Summary for Counseling Recommendations

## 2017-11-25 ENCOUNTER — Ambulatory Visit (INDEPENDENT_AMBULATORY_CARE_PROVIDER_SITE_OTHER): Payer: Commercial Managed Care - PPO | Admitting: Certified Nurse Midwife

## 2017-11-25 VITALS — BP 104/67 | HR 87 | Ht 62.0 in | Wt 130.6 lb

## 2017-11-25 DIAGNOSIS — Z3042 Encounter for surveillance of injectable contraceptive: Secondary | ICD-10-CM | POA: Diagnosis not present

## 2017-11-25 MED ORDER — MEDROXYPROGESTERONE ACETATE 150 MG/ML IM SUSP
150.0000 mg | Freq: Once | INTRAMUSCULAR | Status: AC
  Administered 2017-11-25: 150 mg via INTRAMUSCULAR

## 2017-11-25 NOTE — Progress Notes (Signed)
Date last pap: na Last Depo-Provera: na Side Effects if any: na Serum HCG indicated? na Depo-Provera 150 mg IM given by: Rosine BeatAmy Clontz, CMA Next appointment due Aug 27-Sept 10th,  2019 BP 104/67   Pulse 87   Ht 5\' 2"  (1.575 m)   Wt 130 lb 9.6 oz (59.2 kg)   LMP 11/23/2017   BMI 23.89 kg/m

## 2018-01-04 ENCOUNTER — Emergency Department: Payer: Commercial Managed Care - PPO

## 2018-01-04 ENCOUNTER — Emergency Department
Admission: EM | Admit: 2018-01-04 | Discharge: 2018-01-04 | Disposition: A | Discharge status: Home / Self Care | Payer: Commercial Managed Care - PPO | Attending: Emergency Medicine | Admitting: Emergency Medicine

## 2018-01-04 ENCOUNTER — Other Ambulatory Visit: Payer: Self-pay

## 2018-01-04 DIAGNOSIS — R109 Unspecified abdominal pain: Secondary | ICD-10-CM | POA: Diagnosis present

## 2018-01-04 DIAGNOSIS — K5901 Slow transit constipation: Secondary | ICD-10-CM | POA: Diagnosis not present

## 2018-01-04 DIAGNOSIS — J45909 Unspecified asthma, uncomplicated: Secondary | ICD-10-CM | POA: Diagnosis not present

## 2018-01-04 DIAGNOSIS — Z79899 Other long term (current) drug therapy: Secondary | ICD-10-CM | POA: Insufficient documentation

## 2018-01-04 DIAGNOSIS — K5900 Constipation, unspecified: Secondary | ICD-10-CM

## 2018-01-04 LAB — URINALYSIS, COMPLETE (UACMP) WITH MICROSCOPIC
Bilirubin Urine: NEGATIVE
Glucose, UA: NEGATIVE mg/dL
Ketones, ur: NEGATIVE mg/dL
Leukocytes, UA: NEGATIVE
Nitrite: NEGATIVE
Protein, ur: NEGATIVE mg/dL
Specific Gravity, Urine: 1.015 (ref 1.005–1.030)
pH: 6 (ref 5.0–8.0)

## 2018-01-04 LAB — CBC
HCT: 39.6 % (ref 35.0–47.0)
Hemoglobin: 13.7 g/dL (ref 12.0–16.0)
MCH: 31.9 pg (ref 26.0–34.0)
MCHC: 34.6 g/dL (ref 32.0–36.0)
MCV: 92.3 fL (ref 80.0–100.0)
PLATELETS: 242 10*3/uL (ref 150–440)
RBC: 4.28 MIL/uL (ref 3.80–5.20)
RDW: 11.6 % (ref 11.5–14.5)
WBC: 5.6 10*3/uL (ref 3.6–11.0)

## 2018-01-04 LAB — COMPREHENSIVE METABOLIC PANEL
ALT: 14 U/L (ref 0–44)
AST: 18 U/L (ref 15–41)
Albumin: 4.2 g/dL (ref 3.5–5.0)
Alkaline Phosphatase: 56 U/L (ref 38–126)
Anion gap: 7 (ref 5–15)
BILIRUBIN TOTAL: 0.4 mg/dL (ref 0.3–1.2)
BUN: 16 mg/dL (ref 6–20)
CHLORIDE: 105 mmol/L (ref 98–111)
CO2: 25 mmol/L (ref 22–32)
CREATININE: 0.8 mg/dL (ref 0.44–1.00)
Calcium: 9.1 mg/dL (ref 8.9–10.3)
Glucose, Bld: 100 mg/dL — ABNORMAL HIGH (ref 70–99)
Potassium: 3.8 mmol/L (ref 3.5–5.1)
Sodium: 137 mmol/L (ref 135–145)
TOTAL PROTEIN: 7.4 g/dL (ref 6.5–8.1)

## 2018-01-04 LAB — HCG, QUANTITATIVE, PREGNANCY

## 2018-01-04 LAB — POCT PREGNANCY, URINE: Preg Test, Ur: NEGATIVE

## 2018-01-04 LAB — LIPASE, BLOOD: LIPASE: 42 U/L (ref 11–51)

## 2018-01-04 MED ORDER — LACTULOSE 10 G PO PACK: 10.0000 g | PACK | Freq: Three times a day (TID) | ORAL | 0 refills | Status: DC | PRN

## 2018-01-04 MED ORDER — IOPAMIDOL (ISOVUE-300) INJECTION 61%
100.0000 mL | Freq: Once | INTRAVENOUS | Status: AC | PRN
  Administered 2018-01-04: 100 mL via INTRAVENOUS

## 2018-01-04 MED ORDER — DOCUSATE SODIUM 100 MG PO CAPS
400.0000 mg | ORAL_CAPSULE | Freq: Once | ORAL | Status: AC
  Administered 2018-01-04: 400 mg via ORAL
  Filled 2018-01-04: qty 4

## 2018-01-04 MED ORDER — SENNA 8.6 MG PO TABS
2.0000 | ORAL_TABLET | Freq: Once | ORAL | Status: AC
  Administered 2018-01-04: 17.2 mg via ORAL
  Filled 2018-01-04: qty 2

## 2018-01-04 MED ORDER — MINERAL OIL RE ENEM
1.0000 | ENEMA | Freq: Once | RECTAL | Status: AC
  Administered 2018-01-04: 1 via RECTAL

## 2018-01-04 MED ORDER — BISACODYL 5 MG PO TBEC
20.0000 mg | DELAYED_RELEASE_TABLET | Freq: Once | ORAL | Status: AC
  Administered 2018-01-04: 20 mg via ORAL
  Filled 2018-01-04: qty 4

## 2018-01-04 NOTE — ED Notes (Signed)
ED Provider at bedside. 

## 2018-01-04 NOTE — ED Triage Notes (Signed)
Patient reports noticed abdominal bloating over past few days.  Over past six hours has right lower quad pain.

## 2018-01-04 NOTE — ED Provider Notes (Signed)
[facility] Emergency Department Provider Note  ____________________________________________   First MD Initiated Contact with Patient 01/04/18 [phone]     (approximate)  I have reviewed the triage vital signs and the nursing notes.   HISTORY  Chief Complaint Abdominal Pain   HPI Susan Adkins is a 27 y.o. female who self presents to the emergency department with 3 or 4 days of insidious onset slowly progressive now moderate to severe abdominal bloating and distention with 6 hours of sharp nonradiating right lower quadrant pain.  She has a remote surgical history of tubal ligation but no other abdominal surgeries.  She has noted constipation but has had a recent diarrhea.  No recent antibiotic use.  No fevers or chills.  No chest pain or shortness of breath.  She has no history of liver disease.    Past Medical History:  Diagnosis Date  . Asthma    Well controlled.   . Chicken pox   . Fainting episodes   . Migraines    Well controlled on amitriptyline   . UTI (lower urinary tract infection)     Patient Active Problem List   Diagnosis Date Noted  . Migraine 09/10/2012  . Anxiety 09/10/2012    Past Surgical History:  Procedure Laterality Date  . TONSILLECTOMY  2005  . TUBAL LIGATION Bilateral 02/03/2016   Procedure: BILATERAL TUBAL LIGATION;  Surgeon: Hildred Laser, MD;  Location: [facility] ORS;  Service: Gynecology;  Laterality: Bilateral;    Prior to Admission medications   Medication Sig Start Date End Date Taking? Authorizing Provider  escitalopram (LEXAPRO) 20 MG tablet TAKE ONE TABLET DAILY 08/11/17   Shambley, Melody N, CNM  lactulose (CEPHULAC) 10 g packet Take 1 packet (10 g total) by mouth 3 (three) times daily as needed (constipation). 01/04/18   Merrily Brittle, MD  medroxyPROGESTERone (DEPO-PROVERA) 150 MG/ML injection Inject 1 mL (150 mg total) into the muscle every 3 (three) months. 11/20/17   Shambley, Melody N, CNM  nortriptyline (PAMELOR)  10 MG capsule Take 10 mg by mouth 2 (two) times daily.    [provider]  propranolol (INDERAL) 80 MG tablet Take 80 mg by mouth 3 (three) times daily.    [provider]  traZODone (DESYREL) 100 MG tablet Take 100 mg by mouth 2 (two) times daily.    [provider]  Vitamin D, Ergocalciferol, (DRISDOL) 50000 units CAPS capsule Take 1 capsule (50,000 Units total) by mouth every 7 (seven) days. 11/15/16   Purcell Nails, CNM    Allergies Patient has no known allergies.  Family History  Problem Relation Age of Onset  . Interstitial cystitis Mother   . Depression Mother   . Anxiety disorder Mother   . Bipolar disorder Mother   . Hypertension Father   . Cancer Maternal Grandmother 70       Breast cancer  . Depression Maternal Grandmother   . Hypertension Paternal Grandmother     Social History Social History   Tobacco Use  . Smoking status: Never Smoker  . Smokeless tobacco: Never Used  Substance Use Topics  . Alcohol use: Yes    Alcohol/week: 0.6 oz    Types: 1 Standard drinks or equivalent per week  . Drug use: No    Review of Systems Constitutional: No fever/chills Eyes: No visual changes. ENT: No sore throat. Cardiovascular: Denies chest pain. Respiratory: Denies shortness of breath. Gastrointestinal: Positive for abdominal pain.  Positive for nausea, no vomiting.  Positive for diarrhea.  Positive for constipation. Genitourinary: Negative for dysuria. Musculoskeletal: Negative for back pain. Skin: Negative for rash. Neurological: Negative for headaches, focal weakness or numbness.   ____________________________________________   PHYSICAL EXAM:  VITAL SIGNS: ED Triage Vitals  Enc Vitals Group     BP 01/04/18 0119 121/81     Pulse Rate 01/04/18 0119 92     Resp 01/04/18 0119 16     Temp 01/04/18 0119 99.1 F (37.3 C)     Temp Source 01/04/18 0119 Oral     SpO2 01/04/18 0119 100 %     Weight 01/04/18 0120 130 lb (59 kg)      Height 01/04/18 0120 5\' 2"  (1.575 m)     Head Circumference --      Peak Flow --      Pain Score 01/04/18 0120 5     Pain Loc --      Pain Edu? --      Excl. in GC? --     Constitutional: Alert and oriented x4 quite anxious appearing although nontoxic no diaphoresis Eyes: PERRL EOMI. Head: Atraumatic. Nose: No congestion/rhinnorhea. Mouth/Throat: No trismus Neck: No stridor.   Cardiovascular: Normal rate, regular rhythm. Grossly normal heart sounds.  Good peripheral circulation. Respiratory: Normal respiratory effort.  No retractions. Lungs CTAB and moving good air Gastrointestinal: Distended and tympanitic abdomen.  No fluid wave.  Mild diffuse tenderness with no focality no rebound no guarding and no frank peritonitis Musculoskeletal: No lower extremity edema   Neurologic:  Normal speech and language. No gross focal neurologic deficits are appreciated. Skin:  Skin is warm, dry and intact. No rash noted. Psychiatric: Anxious appearing    ____________________________________________   DIFFERENTIAL includes but not limited to  Small bowel obstruction, volvulus, large bowel obstruction, cirrhosis, malignancy ____________________________________________   LABS (all labs ordered are listed, but only abnormal results are displayed)  Labs Reviewed  COMPREHENSIVE METABOLIC PANEL - Abnormal; Notable for the following components:      Result Value   Glucose, Bld 100 (*)    All other components within normal limits  URINALYSIS, COMPLETE (UACMP) WITH MICROSCOPIC - Abnormal; Notable for the following components:   Color, Urine YELLOW (*)    APPearance CLEAR (*)    Hgb urine dipstick LARGE (*)    Bacteria, UA RARE (*)    All other components within normal limits  LIPASE, BLOOD  CBC  HCG, QUANTITATIVE, PREGNANCY  POCT PREGNANCY, URINE    Lab work reviewed by me with hematuria otherwise no acute disease  noted __________________________________________  EKG   ____________________________________________  RADIOLOGY  CT scan of the abdomen pelvis reviewed by me with significant constipation and slow transit otherwise unremarkable ____________________________________________   PROCEDURES  Procedure(s) performed: no  Procedures  Critical Care performed: no  ____________________________________________   INITIAL IMPRESSION / ASSESSMENT AND PLAN / ED COURSE  Pertinent labs & imaging results that were available during my care of the patient were reviewed by me and considered in my medical decision making (see chart for details).   The patient arrives somewhat anxious and uncomfortable appearing with a significantly distended abdomen of unclear etiology.  At this point given her previous surgical history do think she requires a CT scan with IV contrast to evaluate for small bowel obstruction versus other intra-abdominal pathology.  She declines pain medication at this point.  Fortunately the patient's CT scan shows slow transit and constipation but no other acute surgical or infectious etiology.  I had a lengthy discussion with the  patient and it turns out she has been battling constipation for some time.  We had a lengthy discussion regarding a bowel regimen and given mineral oil enema, senna, Dulcolax, Colace here.  She was unable to have bowel movement which is not surprising.  I have prescribed lactulose and an aggressive over-the-counter bowel regimen however given that she is struggled with this for multiple years I will also refer her to GI as an outpatient.  Strict return precautions have been given to the patient verbalizes understanding agreement with the plan.      ____________________________________________   FINAL CLINICAL IMPRESSION(S) / ED DIAGNOSES  Final diagnoses:  Constipation, unspecified constipation type  Slow transit constipation      NEW MEDICATIONS  STARTED DURING THIS VISIT:  Discharge Medication List as of 01/04/2018  7:01 AM    START taking these medications   Details  lactulose (CEPHULAC) 10 g packet Take 1 packet (10 g total) by mouth 3 (three) times daily as needed (constipation)., Starting Sun 01/04/2018, Print         Note:  This document was prepared using Dragon voice recognition software and may include unintentional dictation errors.     Merrily Brittle, MD 01/04/18 2210

## 2018-01-04 NOTE — Discharge Instructions (Addendum)
Please use the following medications at home to help with your constipation:  Dulcolax 10mg  by mouth up to twice a day Senna daily (for only one week maximum) Colace 100mg  by mouth twice a day Magnesium Citrate by mouth once daily as needed (this one is strong!) Use your lactulose up to three times a day as needed  Make sure you make an appointment to follow up with the GI specialist for a reevaluation.  Return to the ED sooner for any concerns.  It was a pleasure to take care of you today, and thank you for coming to our emergency department.  If you have any questions or concerns before leaving please ask the nurse to grab me and I'm more than happy to go through your aftercare instructions again.  If you were prescribed any opioid pain medication today such as Norco, Vicodin, Percocet, morphine, hydrocodone, or oxycodone please make sure you do not drive when you are taking this medication as it can alter your ability to drive safely.  If you have any concerns once you are home that you are not improving or are in fact getting worse before you can make it to your follow-up appointment, please do not hesitate to call 911 and come back for further evaluation.  Merrily BrittleNeil Berlie Hatchel, MD  Results for orders placed or performed during the hospital encounter of 01/04/18  Lipase, blood  Result Value Ref Range   Lipase 42 11 - 51 U/L  Comprehensive metabolic panel  Result Value Ref Range   Sodium 137 135 - 145 mmol/L   Potassium 3.8 3.5 - 5.1 mmol/L   Chloride 105 98 - 111 mmol/L   CO2 25 22 - 32 mmol/L   Glucose, Bld 100 (H) 70 - 99 mg/dL   BUN 16 6 - 20 mg/dL   Creatinine, Ser 9.810.80 0.44 - 1.00 mg/dL   Calcium 9.1 8.9 - 19.110.3 mg/dL   Total Protein 7.4 6.5 - 8.1 g/dL   Albumin 4.2 3.5 - 5.0 g/dL   AST 18 15 - 41 U/L   ALT 14 0 - 44 U/L   Alkaline Phosphatase 56 38 - 126 U/L   Total Bilirubin 0.4 0.3 - 1.2 mg/dL   GFR calc non Af Amer >60 >60 mL/min   GFR calc Af Amer >60 >60 mL/min   Anion  gap 7 5 - 15  CBC  Result Value Ref Range   WBC 5.6 3.6 - 11.0 K/uL   RBC 4.28 3.80 - 5.20 MIL/uL   Hemoglobin 13.7 12.0 - 16.0 g/dL   HCT 47.839.6 29.535.0 - 62.147.0 %   MCV 92.3 80.0 - 100.0 fL   MCH 31.9 26.0 - 34.0 pg   MCHC 34.6 32.0 - 36.0 g/dL   RDW 30.811.6 65.711.5 - 84.614.5 %   Platelets 242 150 - 440 K/uL  Urinalysis, Complete w Microscopic  Result Value Ref Range   Color, Urine YELLOW (A) YELLOW   APPearance CLEAR (A) CLEAR   Specific Gravity, Urine 1.015 1.005 - 1.030   pH 6.0 5.0 - 8.0   Glucose, UA NEGATIVE NEGATIVE mg/dL   Hgb urine dipstick LARGE (A) NEGATIVE   Bilirubin Urine NEGATIVE NEGATIVE   Ketones, ur NEGATIVE NEGATIVE mg/dL   Protein, ur NEGATIVE NEGATIVE mg/dL   Nitrite NEGATIVE NEGATIVE   Leukocytes, UA NEGATIVE NEGATIVE   RBC / HPF 0-5 0 - 5 RBC/hpf   WBC, UA 0-5 0 - 5 WBC/hpf   Bacteria, UA RARE (A) NONE SEEN   Squamous  Epithelial / LPF 0-5 0 - 5   Mucus PRESENT   hCG, quantitative, pregnancy  Result Value Ref Range   hCG, Beta Chain, Quant, S <1 <5 mIU/mL  Pregnancy, urine POC  Result Value Ref Range   Preg Test, Ur NEGATIVE NEGATIVE   Ct Abdomen Pelvis W Contrast  Result Date: 01/04/2018 CLINICAL DATA:  Initial evaluation for increased abdominal bloating over several days. Right lower quadrant pain. EXAM: CT ABDOMEN AND PELVIS WITH CONTRAST TECHNIQUE: Multidetector CT imaging of the abdomen and pelvis was performed using the standard protocol following bolus administration of intravenous contrast. CONTRAST:  ISOVUE-300 IOPAMIDOL (ISOVUE-300) INJECTION 61% COMPARISON:  None available. FINDINGS: Lower chest: Visualized lung bases are clear. Hepatobiliary: 9 mm hypodensity within the subcapsular left hepatic lobe noted, indeterminate, but of doubtful clinical significance. Liver otherwise unremarkable. Gallbladder within normal limits. No biliary dilatation. Pancreas: Pancreas within normal limits. Spleen: Spleen within normal limits. Adrenals/Urinary Tract: Adrenal  glands are normal. Kidneys equal in size with symmetric enhancement. No nephrolithiasis, hydronephrosis, or focal enhancing renal mass. No appreciable hydroureter. Bladder largely decompressed without acute abnormality. Stomach/Bowel: Stomach within normal limits. No evidence for bowel obstruction. Small tubular structure extending posteriorly from the cecum favored to reflect the appendix, within normal limits with no findings to suggest acute appendicitis. Large volume stool diffusely throughout the colon, suggesting constipation. Mild fecalization of the ileum suggesting slow transit. No other acute inflammatory changes about the bowels. Vascular/Lymphatic: Normal intravascular enhancement seen throughout the intra-abdominal aorta. Mesenteric vessels patent proximally. No adenopathy. Reproductive: Uterus and ovaries within normal limits for age. Other: No free intraperitoneal air. Trace free physiologic fluid within the pelvis. Musculoskeletal: No acute osseous abnormality. No worrisome lytic or blastic osseous lesions. IMPRESSION: 1. Large volume stool diffusely throughout the colon, suggesting constipation. 2. No other acute intra-abdominal or pelvic process. No evidence for obstruction. No other acute inflammatory changes about the bowels. Electronically Signed   By: Rise Mu M.D.   On: 01/04/2018 05:23

## 2018-01-16 ENCOUNTER — Other Ambulatory Visit
Admission: RE | Admit: 2018-01-16 | Discharge: 2018-01-16 | Disposition: A | Discharge status: Home / Self Care | Payer: Commercial Managed Care - PPO | Source: Ambulatory Visit | Attending: Internal Medicine | Admitting: Internal Medicine

## 2018-01-16 DIAGNOSIS — R0602 Shortness of breath: Secondary | ICD-10-CM | POA: Insufficient documentation

## 2018-01-16 DIAGNOSIS — G4700 Insomnia, unspecified: Secondary | ICD-10-CM | POA: Insufficient documentation

## 2018-01-16 DIAGNOSIS — K5904 Chronic idiopathic constipation: Secondary | ICD-10-CM | POA: Insufficient documentation

## 2018-01-16 LAB — FIBRIN DERIVATIVES D-DIMER (ARMC ONLY): FIBRIN DERIVATIVES D-DIMER (ARMC): 627.38 ng{FEU}/mL — AB (ref 0.00–499.00)

## 2018-01-19 ENCOUNTER — Other Ambulatory Visit: Payer: Self-pay | Admitting: Internal Medicine

## 2018-01-19 ENCOUNTER — Ambulatory Visit
Admission: RE | Admit: 2018-01-19 | Discharge: 2018-01-19 | Disposition: A | Discharge status: Home / Self Care | Payer: Commercial Managed Care - PPO | Source: Ambulatory Visit | Attending: Internal Medicine | Admitting: Internal Medicine

## 2018-01-19 DIAGNOSIS — R0602 Shortness of breath: Secondary | ICD-10-CM

## 2018-01-19 DIAGNOSIS — R7989 Other specified abnormal findings of blood chemistry: Secondary | ICD-10-CM | POA: Insufficient documentation

## 2018-01-19 MED ORDER — IOPAMIDOL (ISOVUE-370) INJECTION 76%
75.0000 mL | Freq: Once | INTRAVENOUS | Status: AC | PRN
  Administered 2018-01-19: 75 mL via INTRAVENOUS

## 2018-02-10 ENCOUNTER — Ambulatory Visit (INDEPENDENT_AMBULATORY_CARE_PROVIDER_SITE_OTHER): Payer: Commercial Managed Care - PPO | Admitting: Obstetrics and Gynecology

## 2018-02-10 VITALS — BP 111/79 | HR 75 | Ht 62.0 in | Wt 137.1 lb

## 2018-02-10 DIAGNOSIS — Z3042 Encounter for surveillance of injectable contraceptive: Secondary | ICD-10-CM

## 2018-02-10 MED ORDER — MEDROXYPROGESTERONE ACETATE 150 MG/ML IM SUSP
150.0000 mg | Freq: Once | INTRAMUSCULAR | Status: AC
  Administered 2018-02-10: 150 mg via INTRAMUSCULAR

## 2018-02-10 NOTE — Progress Notes (Signed)
Date last pap: na Last Depo-Provera: 11/25/17 Side Effects if any: pt is gaining weight Serum HCG indicated? na. Depo-Provera 150 mg IM given by: Rosine BeatAmy Kimaya Whitlatch, CMA Next appointment due -pt is going to wait on making next depo appt, she is gaining weight, however she started a new migraine medication about the same time she started the depo, will notify us in about 3 weeks via my chart about her weight. BP 111/79   Pulse 75   Ht 5\' 2"  (1.575 m)   Wt 137 lb 1.6 oz (62.2 kg)   BMI 25.08 kg/m

## 2018-02-25 ENCOUNTER — Other Ambulatory Visit: Payer: Self-pay | Admitting: Obstetrics and Gynecology

## 2018-02-25 MED ORDER — LEVONORGEST-ETH ESTRAD 91-DAY 0.1-0.02 & 0.01 MG PO TABS: 1.0000 | ORAL_TABLET | Freq: Every day | ORAL | 4 refills | Status: DC

## 2018-06-22 ENCOUNTER — Ambulatory Visit: Payer: Commercial Managed Care - PPO | Admitting: Family Medicine

## 2018-06-22 ENCOUNTER — Encounter: Payer: Self-pay | Admitting: Family Medicine

## 2018-06-22 ENCOUNTER — Other Ambulatory Visit (HOSPITAL_COMMUNITY)
Admission: RE | Admit: 2018-06-22 | Discharge: 2018-06-22 | Disposition: A | Discharge status: Home / Self Care | Payer: Commercial Managed Care - PPO | Source: Ambulatory Visit | Attending: Family Medicine | Admitting: Family Medicine

## 2018-06-22 VITALS — BP 122/80 | HR 92 | Temp 98.5°F | Ht 62.0 in | Wt 136.6 lb

## 2018-06-22 DIAGNOSIS — Z8349 Family history of other endocrine, nutritional and metabolic diseases: Secondary | ICD-10-CM

## 2018-06-22 DIAGNOSIS — Z23 Encounter for immunization: Secondary | ICD-10-CM

## 2018-06-22 DIAGNOSIS — F419 Anxiety disorder, unspecified: Secondary | ICD-10-CM

## 2018-06-22 DIAGNOSIS — R7989 Other specified abnormal findings of blood chemistry: Secondary | ICD-10-CM

## 2018-06-22 DIAGNOSIS — J0191 Acute recurrent sinusitis, unspecified: Secondary | ICD-10-CM | POA: Diagnosis not present

## 2018-06-22 DIAGNOSIS — R21 Rash and other nonspecific skin eruption: Secondary | ICD-10-CM | POA: Diagnosis not present

## 2018-06-22 LAB — COMPREHENSIVE METABOLIC PANEL
ALBUMIN: 4 g/dL (ref 3.5–5.2)
ALK PHOS: 61 U/L (ref 39–117)
ALT: 8 U/L (ref 0–35)
AST: 12 U/L (ref 0–37)
BILIRUBIN TOTAL: 0.5 mg/dL (ref 0.2–1.2)
BUN: 10 mg/dL (ref 6–23)
CO2: 25 mEq/L (ref 19–32)
CREATININE: 0.88 mg/dL (ref 0.40–1.20)
Calcium: 9.1 mg/dL (ref 8.4–10.5)
Chloride: 107 mEq/L (ref 96–112)
GFR: 82.03 mL/min (ref >60.00)
GLUCOSE: 104 mg/dL — AB (ref 70–99)
POTASSIUM: 3.9 meq/L (ref 3.5–5.1)
SODIUM: 139 meq/L (ref 135–145)
TOTAL PROTEIN: 6.4 g/dL (ref 6.0–8.3)

## 2018-06-22 LAB — CBC
HEMATOCRIT: 40.3 % (ref 36.0–46.0)
HEMOGLOBIN: 13.5 g/dL (ref 12.0–15.0)
MCHC: 33.6 g/dL (ref 30.0–36.0)
MCV: 90.5 fl (ref 78.0–100.0)
Platelets: 221 10*3/uL (ref 150.0–400.0)
RBC: 4.45 Mil/uL (ref 3.87–5.11)
RDW: 11.9 % (ref 11.5–15.5)
WBC: 3.8 10*3/uL — AB (ref 4.0–10.5)

## 2018-06-22 LAB — B12 AND FOLATE PANEL
Folate: 18.2 ng/mL (ref >5.9)
Vitamin B-12: 221 pg/mL (ref 211–911)

## 2018-06-22 LAB — VITAMIN D 25 HYDROXY (VIT D DEFICIENCY, FRACTURES): VITD: 37.31 ng/mL (ref 30.00–100.00)

## 2018-06-22 MED ORDER — ESCITALOPRAM OXALATE 20 MG PO TABS: 20.0000 mg | ORAL_TABLET | Freq: Every day | ORAL | 3 refills | Status: DC

## 2018-06-22 MED ORDER — METHYLPREDNISOLONE 4 MG PO TBPK: ORAL_TABLET | ORAL | 0 refills | Status: DC

## 2018-06-22 MED ORDER — TRAZODONE HCL 100 MG PO TABS: 100.0000 mg | ORAL_TABLET | Freq: Two times a day (BID) | ORAL | 3 refills | Status: DC

## 2018-06-22 MED ORDER — DOXYCYCLINE HYCLATE 100 MG PO TABS: 100.0000 mg | ORAL_TABLET | Freq: Two times a day (BID) | ORAL | 0 refills | Status: DC

## 2018-06-22 NOTE — Progress Notes (Signed)
Subjective:    Patient ID: Susan Adkins, female    DOB: [birth year], 28 y.o.   MRN: [id number]  HPI   Patient presents to clinic to establish with PCP.  Main concern today is a recurrent sinus infection.  Patient states she had this originally back in October, took a Z-Pak and steroid and was feeling better for a few weeks, but that the end of November all of her symptoms returned and have continued to persist.  Patient feels very congested in her head, has pain in sinuses, has thick nasal drainage, feels like she is always clearing her throat/sniffling.  Patient has tried taking daily Claritin and Allegra without any effect of these medications.  Patient also reports a rash area in her genitalia.  Denies any new lotions, soaps, detergents.  Describes it almost as it looks like diaper rash.  Patient is sexually active with one man, her husband.  Denies any concerns for STDs.  Patient did put Aquaphor ointment on her skin last night, and does think this has helped some.  Patient also needs refill for her Lexapro and trazodone which she uses for anxiety.  Patient's mood is stable on these medications, does not feel that she is overly anxious or overly down.  The trazodone is helpful for her especially when it comes to getting a full nights rest.  Denies any SI or HI.  Patient has a history of migraines, migraine symptoms are controlled with Emgality.  Patient Active Problem List   Diagnosis Date Noted  . Migraine 09/10/2012  . Anxiety 09/10/2012   Social History   Tobacco Use  . Smoking status: Never Smoker  . Smokeless tobacco: Never Used  Substance Use Topics  . Alcohol use: Yes    Alcohol/week: 1.0 standard drinks    Types: 1 Standard drinks or equivalent per week   Social History   Tobacco Use  . Smoking status: Never Smoker  . Smokeless tobacco: Never Used  Substance Use Topics  . Alcohol use: Yes    Alcohol/week: 1.0 standard drinks    Types: 1 Standard drinks or  equivalent per week   Family History  Problem Relation Age of Onset  . Interstitial cystitis Mother   . Depression Mother   . Anxiety disorder Mother   . Bipolar disorder Mother   . Hypertension Father   . Cancer Maternal Grandmother 56       Breast cancer  . Depression Maternal Grandmother   . Asthma Maternal Grandfather   . Hypertension Paternal Grandmother     Review of Systems  Constitutional: Negative for chills, fatigue and fever.  HENT: +congestion, ear pain, sinus pain/drainage and sore throat.   Eyes: Negative.   Respiratory: Negative for cough, shortness of breath and wheezing.   Cardiovascular: Negative for chest pain, palpitations and leg swelling.  Gastrointestinal: Negative for abdominal pain, diarrhea, nausea and vomiting.  Genitourinary: Negative for dysuria, frequency and urgency. +rash in genitalia Musculoskeletal: Negative for arthralgias and myalgias.  Skin: Negative for color change, pallor and rash.  Neurological: Negative for syncope, light-headedness and headaches.  Psychiatric/Behavioral: The patient is not nervous/anxious.       Objective:   Physical Exam Vitals signs and nursing note reviewed.  Constitutional:      General: She is not in acute distress.    Appearance: Normal appearance. She is not toxic-appearing.  HENT:     Head: Normocephalic and atraumatic.     Nose: Congestion and rhinorrhea present.  Right Turbinates: Swollen and pale.     Left Turbinates: Swollen and pale.     Right Sinus: Maxillary sinus tenderness and frontal sinus tenderness present.     Left Sinus: Maxillary sinus tenderness and frontal sinus tenderness present.     Mouth/Throat:     Mouth: Mucous membranes are moist.  Eyes:     General: No scleral icterus.       Right eye: No discharge.        Left eye: No discharge.  Neck:     Musculoskeletal: Normal range of motion and neck supple.  Cardiovascular:     Rate and Rhythm: Normal rate and regular rhythm.    Pulmonary:     Effort: Pulmonary effort is normal. No respiratory distress.     Breath sounds: Normal breath sounds. No wheezing or rales.  Abdominal:     General: Abdomen is flat. Bowel sounds are normal. There is no distension.     Palpations: Abdomen is soft.     Tenderness: There is no abdominal tenderness. There is no guarding or rebound.  Genitourinary:    Exam position: Supine.     Vagina: No vaginal discharge, tenderness, bleeding or lesions.     Cervix: Normal.     Uterus: Not tender.      Adnexa:        Right: No tenderness.         Left: No tenderness.         Comments: Rash area indicated by red circle on diagram, no vesicular lesions or any other lesions seen.  Rash does appear similar to a diaper rash. Patient did take a photo of herself last night prior to using Aquaphor ointment, and when I see genitalia on exam today it does appear less dry and less red and in photo from yesterday. Lymphadenopathy:     Cervical: No cervical adenopathy.  Neurological:     Mental Status: She is alert and oriented to person, place, and time.     Gait: Gait normal.  Psychiatric:        Mood and Affect: Mood normal.        Behavior: Behavior normal.        Thought Content: Thought content normal.       Vitals:   06/22/18 1317  BP: 122/80  Pulse: 92  Temp: 98.5 F (36.9 C)  SpO2: 99%   Assessment & Plan:   Rash of genital area - area does appear improved after use of Aquaphor.  Patient advised to do Aquaphor treatment again tonight before bedtime.  STD testing collected and sent to lab.  Patient does have upcoming appointment with GYN, encouraged to keep this for a specialist's opinion on the rash.  Anxiety - patient will continue Lexapro and trazodone.  She is stable needs medications.  Family history of metabolic and nutritional disorder - we will get lab work today including CBC, CMP, thyroid panel, vitamin D, B12/folate.  Acute recurrent sinusitis - patient will take  doxycycline twice daily for 10 days as well as steroid burst.  She will begin using Flonase nasal spray to help open up congestion.  I am wondering if the sinus infection is related to a untreated chronic allergic rhinitis.  Advised patient that if sinus infection symptoms do not improve with this course of treatment, the next step would be to see a ENT  Flu vaccine given in clinic today.

## 2018-06-23 ENCOUNTER — Ambulatory Visit: Payer: Commercial Managed Care - PPO | Admitting: Obstetrics and Gynecology

## 2018-06-23 ENCOUNTER — Encounter: Payer: Self-pay | Admitting: Obstetrics and Gynecology

## 2018-06-23 ENCOUNTER — Encounter: Payer: Commercial Managed Care - PPO | Admitting: Certified Nurse Midwife

## 2018-06-23 VITALS — BP 128/84 | HR 99 | Ht 62.0 in | Wt 139.1 lb

## 2018-06-23 DIAGNOSIS — N760 Acute vaginitis: Secondary | ICD-10-CM

## 2018-06-23 DIAGNOSIS — N762 Acute vulvitis: Secondary | ICD-10-CM

## 2018-06-23 DIAGNOSIS — B9689 Other specified bacterial agents as the cause of diseases classified elsewhere: Secondary | ICD-10-CM | POA: Diagnosis not present

## 2018-06-23 LAB — THYROID PANEL WITH TSH
FREE THYROXINE INDEX: 3.6 (ref 1.4–3.8)
T3 Uptake: 30 % (ref 22–35)
T4, Total: 11.9 ug/dL (ref 5.1–11.9)
TSH: 0.02 m[IU]/L — AB

## 2018-06-23 MED ORDER — FLUTICASONE PROPIONATE 50 MCG/ACT NA SUSP: 2.0000 | Freq: Every day | NASAL | 6 refills | Status: DC

## 2018-06-23 MED ORDER — CLOBETASOL PROPIONATE 0.05 % EX OINT: 1.0000 "application " | TOPICAL_OINTMENT | Freq: Two times a day (BID) | CUTANEOUS | 2 refills | Status: DC

## 2018-06-23 MED ORDER — TINIDAZOLE 500 MG PO TABS: 2.0000 g | ORAL_TABLET | Freq: Every day | ORAL | 2 refills | Status: DC

## 2018-06-23 NOTE — Progress Notes (Signed)
  Subjective:     Patient ID: Susan Adkins, female   DOB: 1991/03/21, 28 y.o.   MRN: [id number]  HPI Vaginal skin and perineal skin peeling off and splitting. Painful when urine touches it. BMs are painful. Burning with sex and skin tearing. Happening for a few months, now worse. Using Aquaphor daily and it helped at first but not now. Hasn't changed soaps or detergent. Not using sexual lubricants. No new sexual partner. And hasn't had sex with spouse in months due to this. Does report 'issues similar' as toddler in which mother had to stop all soaps and use hairdryer after bathing.   Saw PCP yesterday and they weren't sure what it is but did a STD swab.   Review of Systems  Constitutional: Positive for fatigue.  Genitourinary: Positive for dyspareunia.  All other systems reviewed and are negative.      Objective:   Physical Exam A&Ox4 Well groomed female in np distress Blood pressure 128/84, pulse 99, height 5\' 2"  (1.575 m), weight 139 lb 1.6 oz (63.1 kg). Pelvic exam: VULVA: vulvar tenderness and vulvar excoriation and vulvar erythema between minora and majora and on perineum extending to around rectum-skin culture obtained, VAGINA: vaginal discharge - dark brown bloody-scant, CERVIX: normal appearing cervix without discharge or lesions, WET MOUNT done - results: negative for pathogens, normal epithelial cells, clue cells, RBC.       Assessment:     Vulvar dermatitis BV    Plan:     Counseled on findings. Prescribed tindamax 500mg  bid x 2 days, and clobex ointment bid x 4 weeks. Recommended coconut oil after showers and for sex lubrication as needed.  Discussed possibility of vulvar psoriasis or eczema due to childhood history.   RTC in 4 weeks or as needed.  Melody Shmabley,CNM

## 2018-06-23 NOTE — Addendum Note (Signed)
Addended by: Rosine Beat L on: 06/23/2018 03:08 PM   Modules accepted: Orders

## 2018-06-24 LAB — CERVICOVAGINAL ANCILLARY ONLY
Bacterial vaginitis: NEGATIVE
CHLAMYDIA, DNA PROBE: NEGATIVE
Candida vaginitis: NEGATIVE
NEISSERIA GONORRHEA: NEGATIVE
Trichomonas: NEGATIVE

## 2018-06-24 NOTE — Addendum Note (Signed)
Addended by: Leanora CoverGUSE, LAUREN on: 06/24/2018 02:46 PM   Modules accepted: Orders

## 2018-06-25 ENCOUNTER — Encounter: Payer: Commercial Managed Care - PPO | Admitting: Obstetrics and Gynecology

## 2018-06-25 LAB — CERVICOVAGINAL ANCILLARY ONLY: Herpes: NEGATIVE

## 2018-06-26 ENCOUNTER — Ambulatory Visit: Payer: Commercial Managed Care - PPO | Admitting: Family Medicine

## 2018-06-28 LAB — ANAEROBIC AND AEROBIC CULTURE

## 2018-06-29 ENCOUNTER — Other Ambulatory Visit: Payer: Commercial Managed Care - PPO

## 2018-06-29 ENCOUNTER — Other Ambulatory Visit (INDEPENDENT_AMBULATORY_CARE_PROVIDER_SITE_OTHER): Payer: Commercial Managed Care - PPO

## 2018-06-29 DIAGNOSIS — R7989 Other specified abnormal findings of blood chemistry: Secondary | ICD-10-CM | POA: Diagnosis not present

## 2018-06-30 ENCOUNTER — Other Ambulatory Visit: Payer: Self-pay | Admitting: Obstetrics and Gynecology

## 2018-06-30 LAB — THYROID PANEL WITH TSH
Free Thyroxine Index: 3 (ref 1.4–3.8)
T3 Uptake: 31 % (ref 22–35)
T4, Total: 9.7 ug/dL (ref 5.1–11.9)
TSH: 0.17 mIU/L — ABNORMAL LOW

## 2018-06-30 MED ORDER — CIPROFLOXACIN HCL 500 MG PO TABS: 500.0000 mg | ORAL_TABLET | Freq: Two times a day (BID) | ORAL | 0 refills | Status: DC

## 2018-07-01 ENCOUNTER — Encounter: Payer: Self-pay | Admitting: Family Medicine

## 2018-07-01 NOTE — Addendum Note (Signed)
Addended by: Leanora Cover on: 07/01/2018 02:36 PM   Modules accepted: Orders

## 2018-07-17 ENCOUNTER — Encounter: Payer: Self-pay | Admitting: Obstetrics and Gynecology

## 2018-07-17 ENCOUNTER — Ambulatory Visit: Payer: Commercial Managed Care - PPO | Admitting: Obstetrics and Gynecology

## 2018-07-17 ENCOUNTER — Other Ambulatory Visit: Payer: Self-pay | Admitting: Obstetrics and Gynecology

## 2018-07-17 VITALS — BP 117/68 | HR 87 | Ht 62.0 in | Wt 138.7 lb

## 2018-07-17 DIAGNOSIS — N762 Acute vulvitis: Secondary | ICD-10-CM

## 2018-07-17 DIAGNOSIS — R739 Hyperglycemia, unspecified: Secondary | ICD-10-CM

## 2018-07-17 DIAGNOSIS — Z803 Family history of malignant neoplasm of breast: Secondary | ICD-10-CM | POA: Diagnosis not present

## 2018-07-17 DIAGNOSIS — Z8041 Family history of malignant neoplasm of ovary: Secondary | ICD-10-CM

## 2018-07-17 DIAGNOSIS — R7989 Other specified abnormal findings of blood chemistry: Secondary | ICD-10-CM | POA: Diagnosis not present

## 2018-07-17 NOTE — Progress Notes (Signed)
  Subjective:     Patient ID: Susan Adkins, female   DOB: 12/14/1990, 28 y.o.   MRN: [id number]  HPI  Here for follow up from 06/23/2018 visit for vulvitis of unknown etiology.  States things are slightly better as long as she keeps the cream on there. But burning and pain returns immediately and severe if misses a dose.  Also markedly worse with menses.  Coconut helps as barrier and with lubricant for sex.  Vaginal penetration is very painful. And has bloody discharge for a few days afterwards.   Discussed previous labs and desires retesting today. Request genetic testing for cancer due to multiple family history. Review of Systems  Genitourinary: Positive for dyspareunia, vaginal discharge and vaginal pain.  All other systems reviewed and are negative.      Objective:   Physical Exam A&Ox4 Well groomed female in no distress Blood pressure 117/68, pulse 87, height 5\' 2"  (1.575 m), weight 138 lb 11.2 oz (62.9 kg). Pelvic exam: VULVA: vulvar tenderness and vulvar erythema at upper labial minora junction and clitoris, vulvar lesion labia majora on left upper side, vulvar hyperpigmentation throughout and extending to rectum, VAGINA: normal appearing vagina with normal color and discharge, no lesions, vaginal discharge - bloody and scant, CERVIX: normal appearing cervix without discharge or lesions.   Punch biopsies obtained from left perineum and left labia minora at clitorus    Assessment:     Vulvitis dysparenia Low TSH Elevated glucose Family history of breast and ovarian cancers    Plan:     Labs obtained and will follow up accordingly, will consider thyroid ultrasound if labs still abnormal. Will call and notify of biopsy results and referred to dermatology for second opinion. Discussed genetic screening for cancer, and patient desires lab. Drawn today and will follow up accordingly.  Discussed post-biopsy site care. To stop clobex x 4 days then can restart. OK to continue  coconut oil daily.   RTC in 4 weeks.   ,CNM

## 2018-07-18 ENCOUNTER — Other Ambulatory Visit: Payer: Self-pay | Admitting: Obstetrics and Gynecology

## 2018-07-18 DIAGNOSIS — R7989 Other specified abnormal findings of blood chemistry: Secondary | ICD-10-CM

## 2018-07-18 DIAGNOSIS — E049 Nontoxic goiter, unspecified: Secondary | ICD-10-CM

## 2018-07-18 LAB — THYROID ANTIBODIES
THYROID PEROXIDASE ANTIBODY: 304 [IU]/mL — AB (ref 0–34)
Thyroglobulin Antibody: 1.1 IU/mL — ABNORMAL HIGH (ref 0.0–0.9)

## 2018-07-18 LAB — THYROID PANEL WITH TSH
Free Thyroxine Index: 2 (ref 1.2–4.9)
T3 Uptake Ratio: 23 % — ABNORMAL LOW (ref 24–39)
T4, Total: 8.9 ug/dL (ref 4.5–12.0)
TSH: 5.86 u[IU]/mL — ABNORMAL HIGH (ref 0.450–4.500)

## 2018-07-18 LAB — HEMOGLOBIN A1C
Est. average glucose Bld gHb Est-mCnc: 97 mg/dL
Hgb A1c MFr Bld: 5 % (ref 4.8–5.6)

## 2018-07-21 ENCOUNTER — Telehealth: Payer: Self-pay | Admitting: Obstetrics and Gynecology

## 2018-07-21 ENCOUNTER — Telehealth: Payer: Self-pay | Admitting: *Deleted

## 2018-07-21 ENCOUNTER — Ambulatory Visit (INDEPENDENT_AMBULATORY_CARE_PROVIDER_SITE_OTHER): Payer: Commercial Managed Care - PPO | Admitting: Internal Medicine

## 2018-07-21 ENCOUNTER — Encounter: Payer: Self-pay | Admitting: Internal Medicine

## 2018-07-21 VITALS — BP 110/80 | HR 64 | Ht 62.0 in | Wt 137.0 lb

## 2018-07-21 DIAGNOSIS — E890 Postprocedural hypothyroidism: Secondary | ICD-10-CM | POA: Insufficient documentation

## 2018-07-21 DIAGNOSIS — E063 Autoimmune thyroiditis: Secondary | ICD-10-CM | POA: Diagnosis not present

## 2018-07-21 NOTE — Patient Instructions (Addendum)
Please start Selenium 200 mcg daily.  Please come back for labs in 3-4 weeks.  Please come back for a follow-up appointment in 3 months.

## 2018-07-21 NOTE — Telephone Encounter (Signed)
The patient called and asked if Melody could put in a referral for her endocrinology appointment with someone in "our building" but she is not sure where they are located.  She is asking if Amy can call her back to discuss with her please.  The patient was made aware that Amy was out of the office until Wednesday morning.  Please advise, thanks.

## 2018-07-21 NOTE — Addendum Note (Signed)
Addended by: Rosine Beat L on: 07/21/2018 09:13 AM   Modules accepted: Orders

## 2018-07-21 NOTE — Progress Notes (Signed)
Patient ID: Susan ArntMorgan M Devinney, female   DOB: 10/25/1990, 28 y.o.   MRN: [id number]   HPI  Susan Adkins is a 28 y.o.-year-old female, referred by her OB/GYN provider, Harlow MaresMelody Shambley, NP, for management of abnormal thyroid tests and elevated thyroid antibodies.  Pt. has been found to have a suppressed TSH in 06/2018 by PCP when she presented with weight gain and fatigue.    A repeat TSH by ObGyn was improved, but still suppressed.  However, 2 weeks later, a TSH returned slightly elevated.  At that time, antithyroid antibodies were also elevated.  Also, she was found to have an enlarged thyroid. She was referred to endocrinology at that point.  6 mo ago she describes weight gain of 20 lbs in 3-4 mo despite not changing her eating habits. Also had significant alopecia, increased fatigue/exhaustion.  She started to exercise but was not able to lose more than 4 pounds.    She remembers she had a similar episode when she was 28 y/o: weight gain, drastic hair loss.  She continues to feel very tired, exhausted. She continues to try to exercise. She is tearful today mentioning that she cannot enjoy life because of this.   I reviewed pt's thyroid tests: Lab Results  Component Value Date   TSH 5.860 (H) 07/17/2018   TSH 0.17 (L) 06/29/2018   TSH 0.02 (L) 06/22/2018   TSH 3.790 11/14/2016   TSH 1.40 09/10/2012   TSH 2.39 08/12/2012   TSH 1.02 08/15/2011   TSH 1.210 02/17/2009   FREET4 1.23 02/17/2009   Antithyroid antibodies: Component     Latest Ref Rng & Units 07/17/2018  Thyroperoxidase Ab SerPl-aCnc     0 - 34 IU/mL 304 (H)  Thyroglobulin Antibody     0.0 - 0.9 IU/mL 1.1 (H)   Pt describes: - weight gain - fatigue - Heat and cold intolerance - depression - constipation - hair loss  Pt denies feeling nodules in neck, hoarseness,  SOB with lying down, but does have occasional problems swallowing-sore throat.  She has + FH of thyroid disorders in: mother  - thyroid nodules,  hypothyroidism; MGM and PGM - thyroidectomy. No FH of thyroid cancer.  No h/o radiation tx to head or neck. No recent use of iodine supplements.  Pt. also has a history of HAs - on Emgality, tremors -on Propanolol, carpal tunnel.  She has 2 small children.  She is on OCPs and also had tubal ligation.  ROS: Constitutional: + Weight gain, + fatigue, + both subjective hyperthermia/hypothermia, + insomnia Eyes: no blurry vision, no xerophthalmia ENT: + Sore throat, no nodules palpated in throat, + dysphagia, no hoarseness, + tinnitus Cardiovascular: no CP/SOB/palpitations/leg swelling Respiratory: no cough/SOB Gastrointestinal: + Nausea/vomiting/constipation, no diarrhea Musculoskeletal: + Both muscle/joint aches Skin: no rashes, + hair loss Neurological: + Tremors/numbness/tingling/dizziness, + headache Psychiatric: + Both depression/anxiety + Low libido  Past Medical History:  Diagnosis Date  . Asthma    Well controlled.   . Cardiac arrhythmia due to congenital heart disease   . Chicken pox   . Depression   . Eating disorder   . Fainting episodes   . Fainting spell   . Frequent headaches   . Migraines    Well controlled on amitriptyline   . UTI (lower urinary tract infection)   . UTI (urinary tract infection)    Past Surgical History:  Procedure Laterality Date  . TONSILLECTOMY  2005  . TUBAL LIGATION Bilateral 02/03/2016   Procedure: BILATERAL TUBAL LIGATION;  Surgeon: Hildred LaserAnika Cherry, MD;  Location: [facility] ORS;  Service: Gynecology;  Laterality: Bilateral;   Social History   Socioeconomic History  . Marital status: Married    Spouse name: Barron SchmidSteven Warnock  . Number of children: 2  . Years of education: 6812  . Highest education level: Not on file  Occupational History  . 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 Financial resource strain: Not on file  . Food insecurity:    Worry: Not on file    Inability: Not on file  . Transportation  needs:    Medical: Not on file    Non-medical: Not on file  Tobacco Use  . Smoking status: Never Smoker  . Smokeless tobacco: Never Used  Substance and Sexual Activity  . Alcohol use: Yes    Alcohol/week: 0 standard drinks    Types:   Marland Kitchen. Drug use: No  . Sexual activity: Yes    Partners: Male    Birth control/protection: Pill, Surgical   Current Outpatient Medications on File Prior to Visit  Medication Sig Dispense Refill  . clobetasol ointment (TEMOVATE) 0.05 % Apply 1 application topically 2 (two) times daily. Apply to affected area 30 g 2  . Galcanezumab-gnlm (EMGALITY) 120 MG/ML SOAJ Inject into the skin.    . Levonorgestrel-Ethinyl Estradiol (AMETHIA,CAMRESE) 0.1-0.02 & 0.01 MG tablet Take 1 tablet by mouth daily. 1 Package 4  . traZODone (DESYREL) 100 MG tablet Take 1 tablet (100 mg total) by mouth 2 (two) times daily. 180 tablet 3  . escitalopram (LEXAPRO) 20 MG tablet Take 1 tablet (20 mg total) by mouth daily. 90 tablet 3   No current facility-administered medications on file prior to visit.    No Known Allergies Family History  Problem Relation Age of Onset  . Interstitial cystitis Mother   . Depression Mother   . Anxiety disorder Mother   . Bipolar disorder Mother   . Hypertension Father   . Cancer Maternal Grandmother [phone]       Breast cancer  . Depression Maternal Grandmother   . Asthma Maternal Grandfather   . Hypertension Paternal Grandmother     PE: BP 110/80   Pulse 64   Ht 5\' 2"  (1.575 m) Comment: measured  Wt 137 lb (62.1 kg)   SpO2 98%   BMI 25.06 kg/m  Wt Readings from Last 3 Encounters:  07/21/18 137 lb (62.1 kg)  07/17/18 138 lb 11.2 oz (62.9 kg)  06/23/18 139 lb 1.6 oz (63.1 kg)   Constitutional: Normal weight, in NAD Eyes: PERRLA, EOMI, no exophthalmos ENT: moist mucous membranes, + palpable thyroid, slight thyromegaly-symmetrical, nonnodular, no cervical lymphadenopathy Cardiovascular: RRR, No MRG Respiratory: CTA B Gastrointestinal:  abdomen soft, NT, ND, BS+ Musculoskeletal: no deformities, strength intact in all 4 Skin: moist, warm, no rashes Neurological: + Very mild tremor with outstretched hands, DTR normal in all 4  ASSESSMENT: 1.  Hashimoto's thyroiditis  PLAN:  1. Patient with new diagnosis of Hashimoto's thyroiditis, with fluctuating TFTs. - she has several complaints including fatigue/exhaustion, weight gain, constipation, anxiety/depression - she does not appear to have a thyroid nodules, or neck compression symptoms, but she does have a palpable/enlarged thyroid. - We discussed that it appears that she has inflammation of her thyroid due to the thyroid antibodies -the initial inflammatory phase may be thyrotoxic with subsequent stasis euthyroid or even hypothyroid - we had a long discussion about her Hashimoto thyroiditis diagnosis. I explained that this is an autoimmune disorder, in  which she develops antibodies against her own thyroid. The antibodies bind to the thyroid tissue and cause inflammation, and, eventually, destruction of the gland and hypothyroidism. We don't know how long this process can be, it can last from months to years. - We discussed about treatment for Hashimoto thyroiditis, which is actually limited to thyroid hormones in case her TFTs are abnormal. Supplements like selenium has been tried with various results, some showing improvement in the TPO antibodies. However, there are no randomized controlled trials of this are consistent results between trials. We also discussed about ways to improve her immune system (relaxation, diet, exercise, sleep) to reduce the Ab titer and, subsequently, the thyroid inflammation.  For now, I suggested to start selenium 200 mcg daily pending a new evaluation of her TFTs in approximately 3 weeks. - I also explained that thyroid enlargement especially at the beginning of her Hashimoto thyroiditis course is not uncommon, and it has a waxing and waning character.  -  We will not check her TFTs today since it is been only 5 days since her previous set of test, but I will have her back in 3-4 weeks to repeat them.  At that time, depending on the results, we may need to treat her for thyroiditis or hypothyroidism.  For now, wait 3 very different set of tests, it is difficult to decide for definitive treatment. - Otherwise, I will see her back in 4 months  Orders Placed This Encounter  Procedures  . TSH  . T4, free  . T3, free   Carlus Pavlov, MD PhD [facility] Endocrinology

## 2018-07-21 NOTE — Telephone Encounter (Signed)
Copied from CRM [id number]. Topic: General - Other >> Jul 21, 2018  3:34 PM Jaquita Rector A wrote: Reason for CRM: Patient called to request a referral for Endocrinology sent to the [facility] please. Ph# [phone]

## 2018-07-22 ENCOUNTER — Other Ambulatory Visit: Payer: Self-pay

## 2018-07-22 ENCOUNTER — Encounter: Payer: Self-pay | Admitting: Internal Medicine

## 2018-07-22 ENCOUNTER — Telehealth: Payer: Self-pay | Admitting: Internal Medicine

## 2018-07-22 DIAGNOSIS — E049 Nontoxic goiter, unspecified: Secondary | ICD-10-CM

## 2018-07-22 DIAGNOSIS — R7989 Other specified abnormal findings of blood chemistry: Secondary | ICD-10-CM

## 2018-07-22 LAB — PATHOLOGY REPORT

## 2018-07-22 NOTE — Telephone Encounter (Signed)
Patient states she is having extreme swelling and discomfort today with her Thyroid. States it is hard to swallow and it is achy feeling. Please Advise with patient on what to do, thanks

## 2018-07-22 NOTE — Telephone Encounter (Signed)
I placed the referral on 07/01/2018 to endocrine  [facility]

## 2018-07-22 NOTE — Telephone Encounter (Signed)
Yes I see the note from endocrine in there, had appt yesterday

## 2018-07-22 NOTE — Telephone Encounter (Signed)
Called Pt and she stated she already spoke to Endocrine dept, and she already have everything done.

## 2018-07-22 NOTE — Telephone Encounter (Signed)
Try to take Ibuprofen 200-400 mg every 8h for the next 2 days.

## 2018-07-22 NOTE — Telephone Encounter (Signed)
Pt called [facility] Reason for CRM: Patient called to request a referral for Endocrinology sent to the [facility] please. Ph# [phone]

## 2018-07-23 ENCOUNTER — Telehealth: Payer: Self-pay | Admitting: Obstetrics and Gynecology

## 2018-07-23 ENCOUNTER — Encounter: Payer: Self-pay | Admitting: Internal Medicine

## 2018-07-23 ENCOUNTER — Ambulatory Visit
Admission: RE | Admit: 2018-07-23 | Discharge: 2018-07-23 | Disposition: A | Discharge status: Home / Self Care | Payer: Commercial Managed Care - PPO | Source: Ambulatory Visit | Attending: Internal Medicine | Admitting: Internal Medicine

## 2018-07-23 ENCOUNTER — Ambulatory Visit: Payer: Commercial Managed Care - PPO | Admitting: Internal Medicine

## 2018-07-23 ENCOUNTER — Telehealth: Payer: Self-pay

## 2018-07-23 VITALS — BP 122/70 | HR 78 | Temp 99.0°F | Ht 62.0 in | Wt 139.8 lb

## 2018-07-23 DIAGNOSIS — R946 Abnormal results of thyroid function studies: Secondary | ICD-10-CM | POA: Diagnosis not present

## 2018-07-23 DIAGNOSIS — E063 Autoimmune thyroiditis: Secondary | ICD-10-CM

## 2018-07-23 DIAGNOSIS — E049 Nontoxic goiter, unspecified: Secondary | ICD-10-CM

## 2018-07-23 DIAGNOSIS — E069 Thyroiditis, unspecified: Secondary | ICD-10-CM | POA: Insufficient documentation

## 2018-07-23 DIAGNOSIS — E079 Disorder of thyroid, unspecified: Secondary | ICD-10-CM | POA: Diagnosis not present

## 2018-07-23 DIAGNOSIS — R5383 Other fatigue: Secondary | ICD-10-CM

## 2018-07-23 NOTE — Patient Instructions (Addendum)
[facility]  Dr. Solum/Dr. Gershon Crane'Connell  [phone]   Goiter  A goiter is an enlarged thyroid gland. The thyroid is located in the lower front of the neck. It makes hormones that affect many body parts and systems, including the system that affects how quickly the body burns fuel for energy (metabolism). Most goiters are painless and are not a cause for concern. Some goiters can affect the way your thyroid makes thyroid hormones. Goiters and conditions that cause goiters can be treated, if necessary. What are the causes? Common causes of this condition include:  Lack (deficiency) of a mineral called iodine. The thyroid gland uses iodine to make thyroid hormones.  Diseases that attack healthy cells in the body (autoimmune diseases) and affect thyroid function, such as Graves' disease or Hashimoto's disease. These diseases may cause the body to produce too much thyroid hormone (hyperthyroidism) or too little of the hormone (hypothyroidism).  Conditions that cause inflammation of the thyroid (thyroiditis).  One or more small growths on the thyroid (nodular goiter). Other causes include:  Medical problems caused by abnormal genes that are passed from parent to child (genetic defects).  Thyroid injury or infection.  Tumors that may or may not be cancerous.  Pregnancy.  Certain medicines.  Exposure to radiation. In some cases, the cause may not be known. What increases the risk? This condition is more likely to develop in:  People who do not get enough iodine in their diet.  People who have a family history of goiter.  Women.  People who are older than age 540.  People who smoke tobacco.  People who have had exposure to radiation. What are the signs or symptoms? The main symptom of this condition is swelling in the lower, front part of the neck. This swelling can range from a very small bump to a large lump. Other symptoms may include:  A tight feeling in the  throat.  A hoarse voice.  Coughing.  Wheezing.  Difficulty swallowing or breathing.  Bulging veins in the neck.  Dizziness. When a goiter is the result of an overactive thyroid (hyperthyroidism), symptoms may also include:  Nervousness or restlessness.  Inability to tolerate heat.  Unexplained weight loss.  Diarrhea.  Change in the texture of hair or skin.  Changes in heartbeat, such as skipped beats, extra beats, or a rapid heart rate.  Loss of menstruation.  Shaky hands.  Increased appetite.  Sleep problems. When a goiter is the result of an underactive thyroid (hypothyroidism), symptoms may also include:  Feeling like you have no energy (lethargy).  Inability to tolerate cold.  Weight gain that is not explained by a change in diet or exercise habits.  Dry skin.  Coarse hair.  Irregular menstrual periods.  Constipation.  Sadness or depression.  Fatigue. In some cases, there may not be any symptoms and the thyroid hormone levels may be normal. How is this diagnosed? This condition may be diagnosed based on your symptoms, your medical history, and a physical exam. You may have tests, such as:  Blood tests to check thyroid function.  Imaging tests, such as: ? Ultrasound. ? CT scan. ? MRI. ? Thyroid scan.  Removal of a tissue sample (biopsy) of the goiter or any nodules. The sample will be tested to check for cancer. How is this treated? Treatment for this condition depends on the cause and your symptoms. Treatment may include:  Medicines to regulate thyroid hormone levels.  Anti-inflammatory medicines or steroid medicines, if the goiter  is caused by inflammation.  Iodine supplements or changes to your diet, if the goiter is caused by iodine deficiency.  Radioactive iodine treatment.  Surgery to remove your thyroid. In some cases, you may only need regular check-ups with your health care provider to monitor your condition, and you may not  need treatment. Follow these instructions at home:  Follow instructions from your health care provider about any changes to your diet.  Take over-the-counter and prescription medicines only as told by your health care provider. These include supplements.  Do not use any products that contain nicotine or tobacco, such as cigarettes and e-cigarettes. If you need help quitting, ask your health care provider.  Keep all follow-up visits as told by your health care provider. This is important. Contact a health care provider if:  Your symptoms do not get better with treatment.  You have nausea, vomiting, or diarrhea. Get help right away if:  You have sudden, unexplained confusion or other mental changes.  You have a fever.  You have chest pain.  You have trouble breathing or swallowing.  You suddenly become very weak.  You experience extreme restlessness.  You feel your heart racing. Summary  A goiter is an enlarged thyroid gland.  The thyroid gland is located in the lower front of the neck. It makes hormones that affect many body parts and systems, including the system that affects how quickly the body burns fuel for energy (metabolism).  The main symptom of this condition is swelling in the lower, front part of the neck. This swelling can range from a very small bump to a large lump.  Treatment for this condition depends on the cause and your symptoms. You may need medicines, supplements, or regular monitoring of your condition. This information is not intended to replace advice given to you by your health care provider. Make sure you discuss any questions you have with your health care provider. Document Released: 11/21/2009 Document Revised: 02/27/2017 Document Reviewed: 02/27/2017 Elsevier Interactive Patient Education  [employer]2019 [employer] Inc[employer]

## 2018-07-23 NOTE — Telephone Encounter (Signed)
Chart reviewed, patient communicated with Dr. Elvera Lennox via MyChart regarding her thyroid and what she should do. Patient went to her PCP office today to be seen and stated she does not want to return to [place] and was referred elsewhere.

## 2018-07-23 NOTE — Progress Notes (Signed)
Pre visit review using our clinic review tool, if applicable. No additional management support is needed unless otherwise documented below in the visit note. 

## 2018-07-23 NOTE — Telephone Encounter (Signed)
See other message

## 2018-07-23 NOTE — Telephone Encounter (Signed)
Patient is returning call I did not see a note in chart

## 2018-07-23 NOTE — Telephone Encounter (Signed)
Called pt will go ahead and set pt up for thyroid US

## 2018-07-23 NOTE — Telephone Encounter (Signed)
The patient states her thyroid is swollen and painful, and she is asking for her nurse to call her ASAP to help her decide what she needs to do.  She was told to go to Urgent Care for her thyroid, and she was told she has Hashimoto's disease.  She was told go back in 3 months.  She wants to go to somewhere else.  She is very upset.  She states her throat is very swollen, and can see the outline of her thyroid.  She called back just now to her endocrinologist, and she is seeking advice.  They told her they take care of long term management.  She states she is at a stand still and does not know what to do, and she does not want to wait 3 more months.  Please advise, thanks.

## 2018-07-23 NOTE — Telephone Encounter (Signed)
Patient was seen today.

## 2018-07-23 NOTE — Progress Notes (Signed)
Chief Complaint  Patient presents with  . Thyroid Problem   F/u thyroid problem  C/o thyroid enlarging x 2 weeks but worse x 2-3 days and painful and she is tired with abnormal thyroid tests. She tried Tylenol for pain. Mom and mGM have thyroid issues as well. She saw [facility] but does not want to return and wants 2nd opinion  Results for FIDELA, CIESLAK (MRN [id number]) as of 07/23/2018 13:42  06/22/2018 13:58 TSH: 0.02 (L) Thyroxine (T4): 11.9 Free Thyroxine Index: 3.6 T3 Uptake: 30  06/29/2018 10:38 TSH: 0.17 (L) Thyroxine (T4): 9.7 Free Thyroxine Index: 3.0 T3 Uptake: 31  07/17/2018 11:08 TSH: 5.860 (H) Thyroxine (T4): 8.9 Free Thyroxine Index: 2.0 Thyroperoxidase Ab SerPl-aCnc: 304 (H) Thyroglobulin Antibody: 1.1 (H) T3 Uptake Ratio: 23 (L) Results for SHEREECE, WELLBORN (MRN [id number]) as of 07/23/2018 13:42  11/14/2016 09:54 TSH: 3.790 Thyroxine (T4): 6.7 Free Thyroxine Index: 1.7 T3 Uptake Ratio: 26   Review of Systems  Constitutional: Positive for malaise/fatigue. Negative for weight loss.  HENT:       Feels like outside thyroid pushing on throat    Eyes: Negative for blurred vision.  Respiratory: Negative for shortness of breath.   Cardiovascular: Negative for chest pain.  Skin: Negative for rash.  Endo/Heme/Allergies:       +goiter    Past Medical History:  Diagnosis Date  . Asthma    Well controlled.   . Cardiac arrhythmia due to congenital heart disease   . Chicken pox   . Depression   . Eating disorder   . Fainting episodes   . Fainting spell   . Frequent headaches   . Migraines    Well controlled on amitriptyline   . UTI (lower urinary tract infection)   . UTI (urinary tract infection)    Past Surgical History:  Procedure Laterality Date  . TONSILLECTOMY  2005  . TUBAL LIGATION Bilateral 02/03/2016   Procedure: BILATERAL TUBAL LIGATION;  Surgeon: Rubie Maid, MD;  Location: [facility];  Service: Gynecology;  Laterality: Bilateral;    Family History  Problem Relation Age of Onset  . Interstitial cystitis Mother   . Depression Mother   . Anxiety disorder Mother   . Bipolar disorder Mother   . Hypertension Father   . Cancer Maternal Grandmother 40       Breast cancer  . Depression Maternal Grandmother   . Asthma Maternal Grandfather   . Hypertension Paternal Grandmother    Social History   Socioeconomic History  . Marital status: Married    Spouse name: Bertram Savin  . Number of children: 0  . Years of education: 51  . Highest education level: Not on file  Occupational History  . Occupation: Tree surgeon  Social Needs  . Financial resource strain: Not on file  . Food insecurity:    Worry: Not on file    Inability: Not on file  . Transportation needs:    Medical: Not on file    Non-medical: Not on file  Tobacco Use  . Smoking status: Never Smoker  . Smokeless tobacco: Never Used  Substance and Sexual Activity  . Alcohol use: Yes    Alcohol/week: 1.0 standard drinks    Types: 1 Standard drinks or equivalent per week  . Drug use: No  . Sexual activity: Yes    Partners: Male    Birth control/protection: Pill, Surgical  Lifestyle  . Physical activity:    Days per week: Not on file  Minutes per session: Not on file  . Stress: Not on file  Relationships  . Social connections:    Talks on phone: Not on file    Gets together: Not on file    Attends religious service: Not on file    Active member of club or organization: Not on file    Attends meetings of clubs or organizations: Not on file    Relationship status: Not on file  . Intimate partner violence:    Fear of current or ex partner: Not on file    Emotionally abused: Not on file    Physically abused: Not on file    Forced sexual activity: Not on file  Other Topics Concern  . Not on file  Social History Narrative  . Not on file   Current Meds  Medication Sig  . clobetasol ointment (TEMOVATE) 7.61 % Apply 1 application  topically 2 (two) times daily. Apply to affected area  . escitalopram (LEXAPRO) 20 MG tablet Take 1 tablet (20 mg total) by mouth daily.  Marland Kitchen Galcanezumab-gnlm (EMGALITY) 120 MG/ML SOAJ Inject into the skin.  . Levonorgestrel-Ethinyl Estradiol (AMETHIA,CAMRESE) 0.1-0.02 & 0.01 MG tablet Take 1 tablet by mouth daily.  . traZODone (DESYREL) 100 MG tablet Take 1 tablet (100 mg total) by mouth 2 (two) times daily.   No Known Allergies Recent Results (from the past 2160 hour(s))  Cervicovaginal ancillary only( [place])     Status: None   Collection Time: 06/22/18 12:00 AM  Result Value Ref Range   Bacterial vaginitis Negative for Bacterial Vaginitis Microorganisms     Comment: Normal Reference Range - Negative   Candida vaginitis Negative for Candida species     Comment: Normal Reference Range - Negative   Chlamydia Negative     Comment: Normal Reference Range - Negative   Neisseria gonorrhea Negative     Comment: Normal Reference Range - Negative   Trichomonas Negative     Comment: Normal Reference Range - Negative  Cervicovaginal ancillary only     Status: None   Collection Time: 06/22/18 12:00 AM  Result Value Ref Range   Herpes NEGATIVE for HSV 1 & 2     Comment: Normal Reference Range - Negative  B12 and Folate Panel     Status: None   Collection Time: 06/22/18  1:58 PM  Result Value Ref Range   Vitamin B-12 221 211 - 911 pg/mL   Folate 18.2 >5.9 ng/mL  CBC     Status: Abnormal   Collection Time: 06/22/18  1:58 PM  Result Value Ref Range   WBC 3.8 (L) 4.0 - 10.5 K/uL   RBC 4.45 3.87 - 5.11 Mil/uL   Platelets 221.0 150.0 - 400.0 K/uL   Hemoglobin 13.5 12.0 - 15.0 g/dL   HCT 40.3 36.0 - 46.0 %   MCV 90.5 78.0 - 100.0 fl   MCHC 33.6 30.0 - 36.0 g/dL   RDW 11.9 11.5 - 15.5 %  Comp Met (CMET)     Status: Abnormal   Collection Time: 06/22/18  1:58 PM  Result Value Ref Range   Sodium 139 135 - 145 mEq/L   Potassium 3.9 3.5 - 5.1 mEq/L   Chloride 107 96 - 112 mEq/L   CO2 25  19 - 32 mEq/L   Glucose, Bld 104 (H) 70 - 99 mg/dL   BUN 10 6 - 23 mg/dL   Creatinine, Ser 0.88 0.40 - 1.20 mg/dL   Total Bilirubin 0.5 0.2 - 1.2 mg/dL  Alkaline Phosphatase 61 39 - 117 U/L   AST 12 0 - 37 U/L   ALT 8 0 - 35 U/L   Total Protein 6.4 6.0 - 8.3 g/dL   Albumin 4.0 3.5 - 5.2 g/dL   Calcium 9.1 8.4 - 10.5 mg/dL   GFR 82.03 >60.00 mL/min  Thyroid Panel With TSH     Status: Abnormal   Collection Time: 06/22/18  1:58 PM  Result Value Ref Range   T3 Uptake 30 22 - 35 %   T4, Total 11.9 5.1 - 11.9 mcg/dL   Free Thyroxine Index 3.6 1.4 - 3.8   TSH 0.02 (L) mIU/L    Comment:           Reference Range .           > or = 20 Years  0.40-4.50 .                Pregnancy Ranges           First trimester    0.26-2.66           Second trimester   0.55-2.73           Third trimester    0.43-2.91   Vitamin D (25 hydroxy)     Status: None   Collection Time: 06/22/18  1:58 PM  Result Value Ref Range   VITD 37.31 30.00 - 100.00 ng/mL  Anaerobic and Aerobic Culture     Status: Abnormal   Collection Time: 06/23/18  4:23 PM  Result Value Ref Range   Anaerobic Culture Final report    Result 1 Comment     Comment: No anaerobic growth in 72 hours.   Aerobic Culture Final report (A)    Result 1 Morganella morganii (A)     Comment: Light growth   Result 2 Staphylococcus aureus (A)     Comment: Scant growth Based on susceptibility to oxacillin this isolate would be susceptible to: Penicillinase-stable penicillins, such as:   Cloxacillin, Dicloxacillin, Nafcillin Beta-lactam combination agents, such as:   Amoxicillin-clavulanic acid, Ampicillin-sulbactam,   Piperacillin-tazobactam Oral cephems, such as:   Cefaclor, Cefdinir, Cefpodoxime, Cefprozil, Cefuroxime,   Cephalexin, Loracarbef Parenteral cephems, such as:   Cefazolin, Cefepime, Cefotaxime, Cefotetan, Ceftaroline,   Ceftizoxime, Ceftriaxone, Cefuroxime Carbapenems, such as:   Doripenem, Ertapenem, Imipenem, Meropenem     Result 3 Comment (A)     Comment: Beta hemolytic Streptococcus, group B Light growth Penicillin and ampicillin are drugs of choice for treatment of beta-hemolytic streptococcal infections. Susceptibility testing of penicillins and other beta-lactam agents approved by the FDA for treatment of beta-hemolytic streptococcal infections need not be performed routinely because nonsusceptible isolates are extremely rare in any beta-hemolytic streptococcus and have not been reported for Streptococcus pyogenes (group A). (CLSI)    Result 4 Routine flora     Comment: Light growth   Antimicrobial Susceptibility Comment     Comment:        S = Susceptible; I = Intermediate; R = Resistant **                    P = Positive; N = Negative             MICS are expressed in micrograms per mL    Antibiotic                 RSLT#1    RSLT#2    RSLT#3    RSLT#4 Amoxicillin/Clavulanic Acid    R  Ampicillin                     R Cefazolin                      R Cefuroxime                     R Ciprofloxacin                  S         S Clindamycin                              S Ertapenem                      S Erythromycin                             S Gentamicin                     S         S Imipenem                       S Levofloxacin                   S         S Linezolid                                S Meropenem                      S Moxifloxacin                             S Oxacillin                                S Penicillin                               R Piperacillin/Tazobactam        S Quinupristin/Dalfopristin                S Rifampin                                 S Tetracycline                    R         S Tobramycin                     S Trimethoprim/Sulfa             S         S Vancomycin                               S   Thyroid Panel With TSH     Status: Abnormal   Collection Time: 06/29/18 10:38 AM  Result Value Ref Range   T3 Uptake 31 22 - 35 %   T4, Total 9.7  5.1 - 11.9 mcg/dL   Free Thyroxine Index 3.0 1.4 - 3.8   TSH 0.17 (L) mIU/L    Comment:           Reference Range .           > or = 20 Years  0.40-4.50 .                Pregnancy Ranges           First trimester    0.26-2.66           Second trimester   0.55-2.73           Third trimester    0.43-2.91   Pathology Report     Status: None   Collection Time: 07/17/18 12:00 AM  Result Value Ref Range   Comment Comment     Comment: Material submitted:                                        Marland Kitchen PART A:  Biopsy, Perineum PART B: Vulvar Biopsy, Clitoris    Comment Comment     Comment:  Diagnosis: A.  SQUAMOUS MUCOSA WITH FOCAL FUNGAL HYPHAE (SEE COMMENT)  NEGATIVE FOR DYSPLASIA B.  SQUAMOUS MUCOSA WITH ACUTE INFLAMMATION (SEE COMMENT)  NEGATIVE FOR DYSPLASIA    . Comment:     Comment: A.  PAS stain is positive for fungal hyphae, most likely consistent with Candida species. B.  PAS stain is negative for fungal hyphae.    Comment Comment     Comment: Electronically signed:                                     . Murlean Iba, M.D.    Comment Comment     Comment: Gross description:                                         . A.  Received in formalin is a fragment of tan, soft tissue measuring 0.3 x 0.3 x 0.1 cm and submitted in toto in 1 cassette. B.  Received in formalin is a fragment of tan, soft tissue measuring 0.6 x 0.2 x 0.2 cm and submitted in toto in 1 cassette.    Accession comment: Comment     Comment: Accession comment:                                              .   This case was reviewed by another member of the  department, who agrees with the diagnosis.    Comment Comment     Comment: Pathologist provided ICD-10: N76.2     ACUTE VULVITIS    Comment Comment     Comment: CPT                                                        .  [phone]   Thyroid Panel With TSH     Status: Abnormal   Collection Time: 07/17/18 11:08 AM  Result Value Ref Range    TSH 5.860 (H) 0.450 - 4.500 uIU/mL   T4, Total 8.9 4.5 - 12.0 ug/dL   T3 Uptake Ratio 23 (L) 24 - 39 %   Free Thyroxine Index 2.0 1.2 - 4.9  Thyroid antibodies     Status: Abnormal   Collection Time: 07/17/18 11:08 AM  Result Value Ref Range   Thyroperoxidase Ab SerPl-aCnc 304 (H) 0 - 34 IU/mL   Thyroglobulin Antibody 1.1 (H) 0.0 - 0.9 IU/mL    Comment: Thyroglobulin Antibody measured by Beckman Coulter Methodology  Hemoglobin A1c     Status: None   Collection Time: 07/17/18 11:08 AM  Result Value Ref Range   Hgb A1c MFr Bld 5.0 4.8 - 5.6 %    Comment:          Prediabetes: 5.7 - 6.4          Diabetes: >6.4          Glycemic control for adults with diabetes: <7.0    Est. average glucose Bld gHb Est-mCnc 97 mg/dL   Objective  Body mass index is 25.57 kg/m. Wt Readings from Last 3 Encounters:  07/23/18 139 lb 12.8 oz (63.4 kg)  07/21/18 137 lb (62.1 kg)  07/17/18 138 lb 11.2 oz (62.9 kg)   Temp Readings from Last 3 Encounters:  07/23/18 99 F (37.2 C) (Oral)  06/22/18 98.5 F (36.9 C) (Oral)  01/04/18 99.1 F (37.3 C) (Oral)   BP Readings from Last 3 Encounters:  07/23/18 122/70  07/21/18 110/80  07/17/18 117/68   Pulse Readings from Last 3 Encounters:  07/23/18 78  07/21/18 64  07/17/18 87    Physical Exam Vitals signs and nursing note reviewed.  Constitutional:      Appearance: Normal appearance. She is well-developed and well-groomed.     Comments: Tearful on exam    HENT:     Head: Normocephalic and atraumatic.     Nose: Nose normal.     Mouth/Throat:     Mouth: Mucous membranes are moist.     Pharynx: Oropharynx is clear.  Eyes:     Conjunctiva/sclera: Conjunctivae normal.     Pupils: Pupils are equal, round, and reactive to light.  Neck:     Thyroid: Thyroid mass, thyromegaly and thyroid tenderness present.     Comments: +goiter enlarging   Cardiovascular:     Rate and Rhythm: Normal rate and regular rhythm.     Heart sounds: Normal heart  sounds. No murmur.  Pulmonary:     Effort: Pulmonary effort is normal.     Breath sounds: Normal breath sounds.  Skin:    General: Skin is warm and dry.     Nails: There is no clubbing.   Neurological:     General: No focal deficit present.     Mental Status: She is alert and oriented to person, place, and time. Mental status is at baseline.     Gait: Gait normal.  Psychiatric:        Attention and Perception: Attention and perception normal.        Mood and Affect: Mood normal.        Speech: Speech normal.        Behavior: Behavior normal. Behavior is cooperative.        Thought Content: Thought content normal.  Cognition and Memory: Cognition and memory normal.        Judgment: Judgment normal.     Assessment   1. Painful goiter with enlargement c/w hashimotos and abnormal thyroid labs and fatigue  Plan  1. Stat thyroid US and referral to endocrine [place]  Prn nsaids declines steroids for now    Provider: Dr. Olivia Mackie McLean-Scocuzza-[facility] Medicine

## 2018-07-23 NOTE — Addendum Note (Signed)
Addended by: Quentin Ore on: 07/23/2018 02:15 PM   Modules accepted: Orders

## 2018-07-24 ENCOUNTER — Other Ambulatory Visit: Payer: Self-pay | Admitting: Internal Medicine

## 2018-07-24 ENCOUNTER — Encounter: Payer: Self-pay | Admitting: Family Medicine

## 2018-07-24 ENCOUNTER — Other Ambulatory Visit: Payer: Self-pay | Admitting: Family Medicine

## 2018-07-24 DIAGNOSIS — E0789 Other specified disorders of thyroid: Secondary | ICD-10-CM

## 2018-07-24 MED ORDER — PREDNISONE 5 MG PO TABS: 5.0000 mg | ORAL_TABLET | Freq: Every day | ORAL | 0 refills | Status: DC

## 2018-07-24 MED ORDER — ACETAMINOPHEN-CODEINE #3 300-30 MG PO TABS: 1.0000 | ORAL_TABLET | Freq: Four times a day (QID) | ORAL | 0 refills | Status: DC | PRN

## 2018-07-28 ENCOUNTER — Other Ambulatory Visit: Payer: Self-pay | Admitting: Obstetrics and Gynecology

## 2018-07-28 MED ORDER — FLUCONAZOLE 100 MG PO TABS: 100.0000 mg | ORAL_TABLET | Freq: Every day | ORAL | 1 refills | Status: DC

## 2018-07-30 DIAGNOSIS — R946 Abnormal results of thyroid function studies: Secondary | ICD-10-CM | POA: Diagnosis not present

## 2018-08-07 NOTE — Progress Notes (Signed)
DONE

## 2018-08-11 ENCOUNTER — Encounter: Payer: Self-pay | Admitting: Internal Medicine

## 2018-08-11 ENCOUNTER — Other Ambulatory Visit (INDEPENDENT_AMBULATORY_CARE_PROVIDER_SITE_OTHER): Payer: Commercial Managed Care - PPO

## 2018-08-11 DIAGNOSIS — E063 Autoimmune thyroiditis: Secondary | ICD-10-CM

## 2018-08-11 LAB — T4, FREE: Free T4: 0.76 ng/dL (ref 0.60–1.60)

## 2018-08-11 LAB — TSH: TSH: 4.89 u[IU]/mL — ABNORMAL HIGH (ref 0.35–4.50)

## 2018-08-11 LAB — T3, FREE: T3 FREE: 3.5 pg/mL (ref 2.3–4.2)

## 2018-08-14 ENCOUNTER — Encounter: Payer: Commercial Managed Care - PPO | Admitting: Obstetrics and Gynecology

## 2018-09-17 DIAGNOSIS — E063 Autoimmune thyroiditis: Secondary | ICD-10-CM | POA: Diagnosis not present

## 2018-09-17 DIAGNOSIS — E038 Other specified hypothyroidism: Secondary | ICD-10-CM | POA: Diagnosis not present

## 2018-09-21 ENCOUNTER — Encounter: Payer: Self-pay | Admitting: Family Medicine

## 2018-10-06 ENCOUNTER — Telehealth: Payer: Self-pay | Admitting: Lab

## 2018-10-06 ENCOUNTER — Encounter: Payer: Self-pay | Admitting: Family Medicine

## 2018-10-06 ENCOUNTER — Telehealth: Payer: Self-pay | Admitting: Family Medicine

## 2018-10-06 DIAGNOSIS — G43709 Chronic migraine without aura, not intractable, without status migrainosus: Secondary | ICD-10-CM

## 2018-10-06 MED ORDER — PROPRANOLOL HCL ER 80 MG PO CP24: 80.0000 mg | ORAL_CAPSULE | Freq: Every day | ORAL | 3 refills | Status: DC

## 2018-10-06 NOTE — Telephone Encounter (Signed)
rx refill

## 2018-10-06 NOTE — Telephone Encounter (Signed)
Pt sent My Chart message Hi Susan Adkins! My RX for Propranolol HCL ER 80mg  capsules are almost out, I just realized I have 3 more. Can you please refill this for me? I take it nightly. It was originally prescribed by Dr. Cristopher Peru at [facility] for migraine prevention. If you could send it in to [facility] that would be great!     Thanks!

## 2018-10-06 NOTE — Telephone Encounter (Signed)
rx sent

## 2018-10-23 ENCOUNTER — Ambulatory Visit: Payer: Commercial Managed Care - PPO | Admitting: Internal Medicine

## 2018-11-15 ENCOUNTER — Encounter: Payer: Self-pay | Admitting: Family Medicine

## 2018-11-15 DIAGNOSIS — E063 Autoimmune thyroiditis: Secondary | ICD-10-CM

## 2018-11-16 ENCOUNTER — Telehealth: Payer: Self-pay | Admitting: Lab

## 2018-11-16 MED ORDER — LEVOTHYROXINE SODIUM 50 MCG PO TABS: 50.0000 ug | ORAL_TABLET | Freq: Every day | ORAL | 1 refills | Status: DC

## 2018-11-16 NOTE — Telephone Encounter (Signed)
My Chart message Susan Adkins for about three months I've been taking the Levothyroxine 25mg  and for awhile it was helping. The last several weeks I've been tired, cold, and started gaining some weight which was how I was before starting the levothyroxine. I started taking two pills of the 25mg  and feel much better since doing that. Can you send me in a prescription for 50mg  please. Thanks Susan Adkins

## 2018-11-16 NOTE — Telephone Encounter (Signed)
Sent to NP [facility] as a Medication refill also.

## 2018-11-16 NOTE — Telephone Encounter (Signed)
Called Pt and scheduled her for OV/ FU on 12/01/2018 @ 3:40pm

## 2018-11-21 ENCOUNTER — Encounter: Payer: Self-pay | Admitting: Emergency Medicine

## 2018-11-21 ENCOUNTER — Emergency Department
Admission: EM | Admit: 2018-11-21 | Discharge: 2018-11-21 | Disposition: A | Discharge status: Home / Self Care | Payer: Commercial Managed Care - PPO | Attending: Student in an Organized Health Care Education/Training Program | Admitting: Student in an Organized Health Care Education/Training Program

## 2018-11-21 ENCOUNTER — Emergency Department: Payer: Commercial Managed Care - PPO

## 2018-11-21 ENCOUNTER — Other Ambulatory Visit: Payer: Self-pay

## 2018-11-21 DIAGNOSIS — W268XXA Contact with other sharp object(s), not elsewhere classified, initial encounter: Secondary | ICD-10-CM | POA: Diagnosis not present

## 2018-11-21 DIAGNOSIS — Z23 Encounter for immunization: Secondary | ICD-10-CM | POA: Diagnosis not present

## 2018-11-21 DIAGNOSIS — Y929 Unspecified place or not applicable: Secondary | ICD-10-CM | POA: Diagnosis not present

## 2018-11-21 DIAGNOSIS — S61217A Laceration without foreign body of left little finger without damage to nail, initial encounter: Secondary | ICD-10-CM | POA: Diagnosis not present

## 2018-11-21 DIAGNOSIS — R200 Anesthesia of skin: Secondary | ICD-10-CM | POA: Diagnosis not present

## 2018-11-21 DIAGNOSIS — Y998 Other external cause status: Secondary | ICD-10-CM | POA: Diagnosis not present

## 2018-11-21 DIAGNOSIS — J45909 Unspecified asthma, uncomplicated: Secondary | ICD-10-CM | POA: Insufficient documentation

## 2018-11-21 DIAGNOSIS — Z79899 Other long term (current) drug therapy: Secondary | ICD-10-CM | POA: Diagnosis not present

## 2018-11-21 DIAGNOSIS — Y9389 Activity, other specified: Secondary | ICD-10-CM | POA: Insufficient documentation

## 2018-11-21 DIAGNOSIS — S6992XA Unspecified injury of left wrist, hand and finger(s), initial encounter: Secondary | ICD-10-CM | POA: Diagnosis present

## 2018-11-21 MED ORDER — BACITRACIN ZINC 500 UNIT/GM EX OINT
TOPICAL_OINTMENT | Freq: Once | CUTANEOUS | Status: AC
  Administered 2018-11-21: 1 via TOPICAL
  Filled 2018-11-21: qty 0.9

## 2018-11-21 MED ORDER — HYDROCODONE-ACETAMINOPHEN 5-325 MG PO TABS
1.0000 | ORAL_TABLET | Freq: Once | ORAL | Status: AC
  Administered 2018-11-21: 1 via ORAL
  Filled 2018-11-21: qty 1

## 2018-11-21 MED ORDER — BUPIVACAINE HCL (PF) 0.5 % IJ SOLN
30.0000 mL | Freq: Once | INTRAMUSCULAR | Status: AC
  Administered 2018-11-21: 30 mL
  Filled 2018-11-21: qty 30

## 2018-11-21 MED ORDER — TETANUS-DIPHTH-ACELL PERTUSSIS 5-2.5-18.5 LF-MCG/0.5 IM SUSP
0.5000 mL | Freq: Once | INTRAMUSCULAR | Status: AC
  Administered 2018-11-21: 0.5 mL via INTRAMUSCULAR
  Filled 2018-11-21: qty 0.5

## 2018-11-21 MED ORDER — CEPHALEXIN 500 MG PO CAPS: 500.0000 mg | ORAL_CAPSULE | Freq: Three times a day (TID) | ORAL | 0 refills | Status: DC

## 2018-11-21 MED ORDER — LIDOCAINE-EPINEPHRINE-TETRACAINE (LET) SOLUTION
3.0000 mL | Freq: Once | NASAL | Status: AC
  Administered 2018-11-21: 3 mL via TOPICAL
  Filled 2018-11-21: qty 3

## 2018-11-21 NOTE — ED Notes (Signed)
Bacitracin applied and wound covered.

## 2018-11-21 NOTE — ED Provider Notes (Signed)
[facility] Emergency Department Provider Note    First MD Initiated Contact with Patient 11/21/18 1308     (approximate)  I have reviewed the triage vital signs and the nursing notes.   HISTORY  Chief Complaint Laceration    HPI Susan Adkins is a 28 y.o. female close past medical history with right-hand-dominant presents for evaluation of laceration of the left little finger that occurred while she was moving an engine block with her husband today.  States she is having some numbness to the radial aspect of the distal part of the fifth digit.  Injury occurred shortly prior to arrival.  Describes pain is mild to moderate.    Past Medical History:  Diagnosis Date  . Asthma    Well controlled.   . Cardiac arrhythmia due to congenital heart disease   . Chicken pox   . Depression   . Eating disorder   . Fainting episodes   . Fainting spell   . Frequent headaches   . Migraines    Well controlled on amitriptyline   . UTI (lower urinary tract infection)   . UTI (urinary tract infection)    Family History  Problem Relation Age of Onset  . Interstitial cystitis Mother   . Depression Mother   . Anxiety disorder Mother   . Bipolar disorder Mother   . Hypertension Father   . Cancer Maternal Grandmother [id number]       Breast cancer  . Depression Maternal Grandmother   . Asthma Maternal Grandfather   . Hypertension Paternal Grandmother    Past Surgical History:  Procedure Laterality Date  . TONSILLECTOMY  2005  . TUBAL LIGATION Bilateral 02/03/2016   Procedure: BILATERAL TUBAL LIGATION;  Surgeon: Hildred LaserAnika Cherry, MD;  Location: [facility] ORS;  Service: Gynecology;  Laterality: Bilateral;   Patient Active Problem List   Diagnosis Date Noted  . Thyroiditis 07/23/2018  . Abnormal thyroid function test 07/23/2018  . Enlarged thyroid 07/23/2018  . Hashimoto's thyroiditis 07/21/2018  . Migraine 09/10/2012  . Anxiety 09/10/2012      Prior to Admission  medications   Medication Sig Start Date End Date Taking? Authorizing Provider  acetaminophen-codeine (TYLENOL #3) 300-30 MG tablet Take 1 tablet by mouth every 6 (six) hours as needed for moderate pain. 07/24/18   Tracey HarriesGuse, Lauren M, FNP  clobetasol ointment (TEMOVATE) 0.05 % Apply 1 application topically 2 (two) times daily. Apply to affected area 06/23/18   Shambley, Melody N, CNM  escitalopram (LEXAPRO) 20 MG tablet Take 1 tablet (20 mg total) by mouth daily. 06/22/18   Tracey HarriesGuse, Lauren M, FNP  fluconazole (DIFLUCAN) 100 MG tablet Take 1 tablet (100 mg total) by mouth daily. 07/28/18   Shambley, Melody N, CNM  Galcanezumab-gnlm (EMGALITY) 120 MG/ML SOAJ Inject into the skin.    [provider]  Levonorgestrel-Ethinyl Estradiol (AMETHIA,CAMRESE) 0.1-0.02 & 0.01 MG tablet Take 1 tablet by mouth daily. 02/25/18   Shambley, Melody N, CNM  levothyroxine (SYNTHROID) 50 MCG tablet Take 1 tablet (50 mcg total) by mouth daily. 11/16/18   Guse, Janna ArchLauren M, FNP  predniSONE (DELTASONE) 5 MG tablet Take 1 tablet (5 mg total) by mouth daily with breakfast. Take 4 tab x 2 days, 3 tab x 2 days, 2 tab x 2 days, 1 tab x2 days, then stop 07/24/18   Carlus PavlovGherghe, Cristina, MD  propranolol ER (INDERAL LA) 80 MG 24 hr capsule Take 1 capsule (80 mg total) by mouth daily. 10/06/18   Tracey HarriesGuse, Lauren M,  FNP  traZODone (DESYREL) 100 MG tablet Take 1 tablet (100 mg total) by mouth 2 (two) times daily. 06/22/18   Tracey HarriesGuse, Lauren M, FNP    Allergies Patient has no known allergies.    Social History Social History   Tobacco Use  . Smoking status: Never Smoker  . Smokeless tobacco: Never Used  Substance Use Topics  . Alcohol use: Yes    Alcohol/week: 1.0 standard drinks    Types: 1 Standard drinks or equivalent per week  . Drug use: No    Review of Systems Patient denies headaches, rhinorrhea, blurry vision, numbness, shortness of breath, chest pain, edema, cough, abdominal pain, nausea, vomiting, diarrhea, dysuria, fevers, rashes or  hallucinations unless otherwise stated above in HPI. ____________________________________________   PHYSICAL EXAM:  VITAL SIGNS: Vitals:   11/21/18 1304  BP: 111/74  Pulse: 67  Resp: 16  Temp: 98.2 F (36.8 C)  SpO2: 98%    Constitutional: Alert and oriented.  Eyes: Conjunctivae are normal.  Head: Atraumatic. Nose: No congestion/rhinnorhea. Mouth/Throat: Mucous membranes are moist.   Neck: No stridor. Painless ROM.  Cardiovascular: Normal rate, regular rhythm. Grossly normal heart sounds.  Good peripheral circulation. Respiratory: Normal respiratory effort.  No retractions. Lungs CTAB. Gastrointestinal: Soft and nontender. No distention. No abdominal bruits. No CVA tenderness. Genitourinary:  Musculoskeletal: There was gross contamination of the wound which was extensively cleaned.  No evidence of joint capsule violation.  Extensor and flexor mechanisms are intact.  Does have some sensory deficit to radial aspect the distal most part of the fifth digit.  Laceration is roughly 1.5 cm in length.  No lower extremity tenderness nor edema.  No joint effusions. Neurologic:  Normal speech and language. No gross focal neurologic deficits are appreciated. No facial droop Skin:  Skin is warm, dry and intact. No rash noted. Psychiatric: Mood and affect are normal. Speech and behavior are normal.  ____________________________________________   LABS (all labs ordered are listed, but only abnormal results are displayed)  No results found for this or any previous visit (from the past 24 hour(s)). ____________________________________________ ____________________________________________  RADIOLOGY  I personally reviewed all radiographic images ordered to evaluate for the above acute complaints and reviewed radiology reports and findings.  These findings were personally discussed with the patient.  Please see medical record for radiology report.   ____________________________________________   PROCEDURES  Procedure(s) performed:  Marland Kitchen.Marland Kitchen.Laceration Repair Date/Time: 11/21/2018 2:17 PM Performed by: Willy Eddyobinson, Gianmarco Roye, MD Authorized by: Willy Eddyobinson, Khloe Hunkele, MD   Consent:    Consent obtained:  Verbal   Consent given by:  Patient   Risks discussed:  Infection, pain, retained foreign body, poor cosmetic result and poor wound healing Anesthesia (see MAR for exact dosages):    Anesthesia method:  Local infiltration   Local anesthetic:  Lidocaine 1% w/o epi Laceration details:    Location:  Finger   Finger location:  L small finger   Length (cm):  1.5   Depth (mm):  3 Repair type:    Repair type:  Simple Exploration:    Hemostasis achieved with:  Direct pressure   Wound exploration: entire depth of wound probed and visualized     Contaminated: no   Treatment:    Area cleansed with:  Saline and Betadine   Amount of cleaning:  Extensive   Irrigation solution:  Sterile saline   Visualized foreign bodies/material removed: no   Skin repair:    Repair method:  Sutures Approximation:    Approximation:  Close Post-procedure details:  Dressing:  Sterile dressing   Patient tolerance of procedure:  Tolerated well, no immediate complications      Critical Care performed: no ____________________________________________   INITIAL IMPRESSION / ASSESSMENT AND PLAN / ED COURSE  Pertinent labs & imaging results that were available during my care of the patient were reviewed by me and considered in my medical decision making (see chart for details).   DDX: laceration, fracture, forieng body, ligamentous injury  ROVENA HEARLD is a 28 y.o. who presents to the ED with 1.5 cm laceration on lt 5th digit.  Exam as above. Denies any other injuries. Td updates. VSS. Exam with distal NV intact. No functional tendon deficit, no sensory deficit. No signs of erythema or surrounding infection. Plan lac repair.  Laceration was explored,  irrigated, and repaired without complications. No FB in a bloodless field.    X-ray is also w/o FOB or fracture.   Discussed at length wound care and infection precautions with patient.      The patient was evaluated in Emergency Department today for the symptoms described in the history of present illness. He/she was evaluated in the context of the global COVID-19 pandemic, which necessitated consideration that the patient might be at risk for infection with the SARS-CoV-2 virus that causes COVID-19. Institutional protocols and algorithms that pertain to the evaluation of patients at risk for COVID-19 are in a state of rapid change based on information released by regulatory bodies including the CDC and federal and state organizations. These policies and algorithms were followed during the patient's care in the ED.  As part of my medical decision making, I reviewed the following data within the [place] notes reviewed and incorporated, Labs reviewed, notes from prior ED visits and [place] Controlled Substance Database   ____________________________________________   FINAL CLINICAL IMPRESSION(S) / ED DIAGNOSES  Final diagnoses:  Laceration of left little finger without foreign body without damage to nail, initial encounter      NEW MEDICATIONS STARTED DURING THIS VISIT:  New Prescriptions   No medications on file     Note:  This document was prepared using Dragon voice recognition software and may include unintentional dictation errors.    Merlyn Lot, MD 11/21/18 [phone]

## 2018-11-21 NOTE — ED Triage Notes (Signed)
Sent from [facility] for evaluation of left 5th finger laceration.  Patient states she was moving a [employer] officer, nature when engine slipped and injured finger.   Approximate 1 inch laceration to fifth finger.  Bleeding controlled.  + CMS to digit.  Patient c/o numbness distally

## 2018-11-21 NOTE — ED Notes (Signed)
Pt verbalized understanding of discharge instructions. NAD at this time. 

## 2018-11-24 ENCOUNTER — Encounter: Payer: Commercial Managed Care - PPO | Admitting: Obstetrics and Gynecology

## 2018-11-25 ENCOUNTER — Other Ambulatory Visit: Payer: Self-pay

## 2018-11-26 ENCOUNTER — Ambulatory Visit: Payer: Commercial Managed Care - PPO | Admitting: Family Medicine

## 2018-11-26 VITALS — BP 92/60 | HR 68 | Temp 99.0°F | Resp 16 | Ht 62.0 in | Wt 141.0 lb

## 2018-11-26 DIAGNOSIS — R5382 Chronic fatigue, unspecified: Secondary | ICD-10-CM

## 2018-11-26 DIAGNOSIS — Z4802 Encounter for removal of sutures: Secondary | ICD-10-CM | POA: Diagnosis not present

## 2018-11-26 DIAGNOSIS — G43709 Chronic migraine without aura, not intractable, without status migrainosus: Secondary | ICD-10-CM

## 2018-11-26 DIAGNOSIS — E063 Autoimmune thyroiditis: Secondary | ICD-10-CM

## 2018-11-26 NOTE — Progress Notes (Signed)
Subjective:    Patient ID: Susan Adkins, female    DOB: 05/19/1991, 28 y.o.   MRN: [id number]  HPI  Presents to clinic for suture removal. Was seen at ER on 11/21/2018 after accidentally cutting left little finger while helping husband move engine block.   Per patient area has healed well. No drainage from area. ROM of finger intact.   Patient continues to struggle with chronic fatigue.  States she will wake up with in the morning even after sleeping in for an hour or so and by 2 or 3:00 she will feel so tired as if she really needs a nap.  States this has been going on for many many months.  She is managed by endocrinology for Hashimoto's thyroiditis and per endocrinology issues stable.  She has had multiple lab studies to look at electrolytes and vitamins and everything always comes back normal.  Patient does have history of chronic migraines and has also seen neurology for follow-up of this; has a mild daily headache all of the time and has for years and a breakthrough migraine on occasion.  States neurologist did mention possibility of chronic fatigue syndrome but no one has said definitively that it is that.   Patient Active Problem List   Diagnosis Date Noted  . Thyroiditis 07/23/2018  . Abnormal thyroid function test 07/23/2018  . Enlarged thyroid 07/23/2018  . Hashimoto's thyroiditis 07/21/2018  . Migraine 09/10/2012  . Anxiety 09/10/2012   Social History   Tobacco Use  . Smoking status: Never Smoker  . Smokeless tobacco: Never Used  Substance Use Topics  . Alcohol use: Yes    Alcohol/week: 1.0 standard drinks    Types: 1 Standard drinks or equivalent per week   Review of Systems  Constitutional: Negative for chills, and fever. +chronic fatigue HENT: Negative for congestion, ear pain, sinus pain and sore throat.   Eyes: Negative.   Respiratory: Negative for cough, shortness of breath and wheezing.   Cardiovascular: Negative for chest pain, palpitations and leg  swelling.  Gastrointestinal: Negative for abdominal pain, diarrhea, nausea and vomiting.  Genitourinary: Negative for dysuria, frequency and urgency.  Musculoskeletal: Negative for arthralgias and myalgias.  Skin: Negative for color change, pallor and rash.  Neurological: Negative for syncope, light-headedness and headaches.  Psychiatric/Behavioral: The patient is not nervous/anxious.       Objective:   Physical Exam Vitals signs and nursing note reviewed.  Constitutional:      General: She is not in acute distress.    Appearance: She is not toxic-appearing.  HENT:     Head: Normocephalic and atraumatic.  Eyes:     General: No scleral icterus.    Extraocular Movements: Extraocular movements intact.     Pupils: Pupils are equal, round, and reactive to light.  Neck:     Musculoskeletal: Normal range of motion and neck supple. No neck rigidity.  Cardiovascular:     Rate and Rhythm: Normal rate and regular rhythm.  Pulmonary:     Effort: Pulmonary effort is normal. No respiratory distress.     Breath sounds: Normal breath sounds.  Skin:    Comments: 4 sutures successfully removed from patient's left little finger.  Wound is well-healed and well approximated.  No drainage or signs of infection.  After sutures removed area cleansed with alcohol swab, thin layer bacitracin applied and area wrapped with sterile gauze and secured with Kerlix Kling wrap.  Advised to change bandage once a day.  Advised after the next 3  days that she can leave finger open to air no longer has to put ointment or cover with bandage.  Neurological:     General: No focal deficit present.     Mental Status: She is alert and oriented to person, place, and time.     Gait: Gait normal.  Psychiatric:        Mood and Affect: Mood normal.        Behavior: Behavior normal.        Thought Content: Thought content normal.        Judgment: Judgment normal.      Vitals:   11/26/18 1015  BP: 92/60  Pulse: 68  Resp:  16  Temp: 99 F (37.2 C)  SpO2: 98%      Assessment & Plan:    A total of 25  minutes were spent face-to-face with the patient during this encounter and over half of that time was spent on counseling and coordination of care. The patient was counseled on possibility her fatigue is related to chronic fatigue syndrome, thyroid problems   Suture removal-sutures successfully removed & wound care performed   Hashimoto's thyroiditis- currently on levothyroxine 50 mcg daily.  Follows regularly with endocrinology.  Chronic migraine- has a mild daily headache every day, this is not new when she manages as needed with Tylenol or Motrin.  Have abortive therapy for migraines if needed but has not needed in many months.  We will continue to follow with neurology.    Chronic fatigue- discussed with patient that her fatigue could be related to chronic fatigue syndrome.  Looking back in her lab work, nothing other than the thyroid being slightly abnormal has been out of the ordinary.  Patient states her lab work always comes back normal.  Patient prefer not to have to do new lab work today.  Patient understands that her fatigue could be related to chronic fatigue syndrome and overall is satisfied that this could potentially explain what is going on.  Offered referral to rheumatology for evaluation of chronic fatigue, but declines at this time but will let me know if she changes her mind.    Patient will follow-up here in 3 to 4 months for management of chronic conditions.  She will return to clinic sooner if any issues arise

## 2018-11-27 ENCOUNTER — Encounter: Payer: Self-pay | Admitting: Obstetrics and Gynecology

## 2018-11-27 ENCOUNTER — Ambulatory Visit (INDEPENDENT_AMBULATORY_CARE_PROVIDER_SITE_OTHER): Payer: Commercial Managed Care - PPO | Admitting: Obstetrics and Gynecology

## 2018-11-27 ENCOUNTER — Other Ambulatory Visit: Payer: Self-pay

## 2018-11-27 VITALS — BP 103/75 | HR 75 | Ht 62.0 in | Wt 139.9 lb

## 2018-11-27 DIAGNOSIS — Z01419 Encounter for gynecological examination (general) (routine) without abnormal findings: Secondary | ICD-10-CM | POA: Diagnosis not present

## 2018-11-27 MED ORDER — LEVONORGEST-ETH ESTRAD 91-DAY 0.1-0.02 & 0.01 MG PO TABS: 1.0000 | ORAL_TABLET | Freq: Every day | ORAL | 4 refills | Status: DC

## 2018-11-27 NOTE — Patient Instructions (Addendum)
 Preventive Care 18-39 Years, Female Preventive care refers to lifestyle choices and visits with your health care provider that can promote health and wellness. What does preventive care include?   A yearly physical exam. This is also called an annual well check.  Dental exams once or twice a year.  Routine eye exams. Ask your health care provider how often you should have your eyes checked.  Personal lifestyle choices, including: ? Daily care of your teeth and gums. ? Regular physical activity. ? Eating a healthy diet. ? Avoiding tobacco and drug use. ? Limiting alcohol use. ? Practicing safe sex. ? Taking vitamin and mineral supplements as recommended by your health care provider. What happens during an annual well check? The services and screenings done by your health care provider during your annual well check will depend on your age, overall health, lifestyle risk factors, and family history of disease. Counseling Your health care provider may ask you questions about your:  Alcohol use.  Tobacco use.  Drug use.  Emotional well-being.  Home and relationship well-being.  Sexual activity.  Eating habits.  Work and work environment.  Method of birth control.  Menstrual cycle.  Pregnancy history. Screening You may have the following tests or measurements:  Height, weight, and BMI.  Diabetes screening. This is done by checking your blood sugar (glucose) after you have not eaten for a while (fasting).  Blood pressure.  Lipid and cholesterol levels. These may be checked every 5 years starting at age 20.  Skin check.  Hepatitis C blood test.  Hepatitis B blood test.  Sexually transmitted disease (STD) testing.  BRCA-related cancer screening. This may be done if you have a family history of breast, ovarian, tubal, or peritoneal cancers.  Pelvic exam and Pap test. This may be done every 3 years starting at age 21. Starting at age 30, this may be done  every 5 years if you have a Pap test in combination with an HPV test. Discuss your test results, treatment options, and if necessary, the need for more tests with your health care provider. Vaccines Your health care provider may recommend certain vaccines, such as:  Influenza vaccine. This is recommended every year.  Tetanus, diphtheria, and acellular pertussis (Tdap, Td) vaccine. You may need a Td booster every 10 years.  Varicella vaccine. You may need this if you have not been vaccinated.  HPV vaccine. If you are 26 or younger, you may need three doses over 6 months.  Measles, mumps, and rubella (MMR) vaccine. You may need at least one dose of MMR. You may also need a second dose.  Pneumococcal 13-valent conjugate (PCV13) vaccine. You may need this if you have certain conditions and were not previously vaccinated.  Pneumococcal polysaccharide (PPSV23) vaccine. You may need one or two doses if you smoke cigarettes or if you have certain conditions.  Meningococcal vaccine. One dose is recommended if you are age 19-21 years and a first-year college student living in a residence hall, or if you have one of several medical conditions. You may also need additional booster doses.  Hepatitis A vaccine. You may need this if you have certain conditions or if you travel or work in places where you may be exposed to hepatitis A.  Hepatitis B vaccine. You may need this if you have certain conditions or if you travel or work in places where you may be exposed to hepatitis B.  Haemophilus influenzae type b (Hib) vaccine. You may need this if   you have certain risk factors. Talk to your health care provider about which screenings and vaccines you need and how often you need them. This information is not intended to replace advice given to you by your health care provider. Make sure you discuss any questions you have with your health care provider. Document Released: 07/30/2001 Document Revised:  01/14/2017 Document Reviewed: 04/04/2015 Elsevier Interactive Patient Education  2019 [place] 5-HTP daily Chronic Fatigue Syndrome Chronic fatigue syndrome (CFS) is a condition that causes extreme tiredness (fatigue). This fatigue does not improve with rest, and it gets worse with physical or mental activity. You may have several other symptoms along with fatigue. Symptoms may come and go, but they generally last for months. Sometimes, CFS gets better over time, but it can be a lifelong condition. There is no cure, but there are many possible treatments. You will need to work with your health care providers to find a treatment plan that works best for you. What are the causes? The cause of CFS is not known. There may be more than one cause. Possible causes include:  An infection.  An abnormal body defense system (immune system).  Low blood pressure.  Poor diet.  Physical or emotional stress. What increases the risk? You are more likely to develop this condition if:  You are female.  You are 45?27 years old.  You have a family history of CFS.  You live with a lot of emotional stress. What are the signs or symptoms? The main symptom of CFS is fatigue that is severe enough to interfere with day-to-day activities. This fatigue does not get better with rest, and it gets worse with physical or mental activity. There are eight other major symptoms of CFS:  Lack of energy (malaise) that lasts more than 24 hours after physical exertion.  Sleep that does not relieve fatigue (unrefreshing sleep).  Short-term memory loss or confusion.  Joint pain without redness or swelling.  Muscle aches.  Headaches.  Painful and swollen glands (lymph nodes) in the neck or under the arms.  Sore throat. You may also have:  Abdominal cramps, constipation, or diarrhea (irritable bowel).  Chills.  Night sweats.  Vision changes.  Dizziness.  Mental confusion (brain  fog).  Clumsiness.  Sensitivity to food, noise, or odors.  Mood swings, depression, or anxiety attacks. How is this diagnosed? There are no tests that can diagnose this condition. Your health care provider will make the diagnosis based on your medical history, a physical exam, and a mental health exam. However, it is important to make sure that your symptoms are not caused by another medical condition. You may have lab tests or X-rays to rule out other conditions. For your health care provider to diagnose CFS:  You must have had fatigue for at least 6 straight months.  Fatigue must be your first symptom, and it must be severe enough to interfere with day-to-day activities.  There must be no other cause found for the fatigue.  You must also have at least four of the eight other major symptoms of CFS. How is this treated? There is no cure for CFS. The condition affects everyone differently. You will need to work with your team of health care providers to find the best treatments for your symptoms. Your team may include your primary care provider, physical and exercise therapists, and mental health therapists. Treatment may include:  Improving sleep with a regular bedtime routine.  Avoiding caffeine, alcohol, and  tobacco.  Doing light exercise and stretching during the day.  Taking medicines to help you sleep or to relieve joint or muscle pain.  Learning and practicing relaxation techniques.  Using memory aids or doing brainteasers to improve memory and concentration.  Seeing a mental health therapist to evaluate and treat depression, if necessary.  Trying massage therapy, acupuncture, and movement exercises, such as yoga or tai chi. Follow these instructions at home:  Activity  Exercise regularly, as told by your health care provider.  Avoid fatigue by pacing yourself during the day and getting enough sleep at night.  Go to bed and get up at the same time every day. Eating  and drinking  Avoid caffeine and alcohol.  Avoid heavy meals in the evening.  Eat a well-balanced diet. General instructions  Take over-the-counter and prescription medicines only as told by your health care provider.  Do not use herbal or dietary supplements unless they are approved by your health care provider.  Maintain a healthy weight.  Avoid stress and use stress-reducing techniques that you learn in therapy.  Do not use any products that contain nicotine or tobacco, such as cigarettes and e-cigarettes. If you need help quitting, ask your health care provider.  Consider joining a CFS support group.  Keep all follow-up visits as told by your health care provider. This is important. Contact a health care provider if:  Your symptoms do not get better or they get worse.  You feel angry, guilty, anxious, or depressed. This information is not intended to replace advice given to you by your health care provider. Make sure you discuss any questions you have with your health care provider. Document Released: 07/11/2004 Document Revised: 02/08/2016 Document Reviewed: 09/11/2015 Elsevier Interactive Patient Education  2019 [employer]  

## 2018-11-27 NOTE — Progress Notes (Signed)
Subjective:     Susan Adkins is a married white 28 y.o. female and is here for a comprehensive physical exam. FT homemaker. G2P2. C/o fatigue which is not new. Is sexually active with female spouse.  The patient reports problems - fatigue worsens, and feels like belly is bloated looking.  Social History   Socioeconomic History  . Marital status: Married    Spouse name: Barron SchmidSteven Warnock  . Number of children: 0  . Years of education: 5312  . Highest education level: Not on file  Occupational History  . Occupation: [employer]  Social Needs  . Financial resource strain: Not on file  . Food insecurity    Worry: Not on file    Inability: Not on file  . Transportation needs    Medical: Not on file    Non-medical: Not on file  Tobacco Use  . Smoking status: Never Smoker  . Smokeless tobacco: Never Used  Substance and Sexual Activity  . Alcohol use: Yes    Alcohol/week: 1.0 standard drinks    Types: 1 Standard drinks or equivalent per week  . Drug use: No  . Sexual activity: Yes    Partners: Male    Birth control/protection: Pill, Surgical  Lifestyle  . Physical activity    Days per week: Not on file    Minutes per session: Not on file  . Stress: Not on file  Relationships  . Social Musicianconnections    Talks on phone: Not on file    Gets together: Not on file    Attends religious service: Not on file    Active member of club or organization: Not on file    Attends meetings of clubs or organizations: Not on file    Relationship status: Not on file  . Intimate partner violence    Fear of current or ex partner: Not on file    Emotionally abused: Not on file    Physically abused: Not on file    Forced sexual activity: Not on file  Other Topics Concern  . Not on file  Social History Narrative  . Not on file   Health Maintenance  Topic Date Due  . INFLUENZA VACCINE  01/16/2019  . PAP-Cervical Cytology Screening  11/15/2019  . PAP SMEAR-Modifier  11/15/2019  .  TETANUS/TDAP  11/20/2028  . HIV Screening  Completed    The following portions of the patient's history were reviewed and updated as appropriate: allergies, current medications, past family history, past medical history, past social history, past surgical history and problem list.  Review of Systems Pertinent items noted in HPI and remainder of comprehensive ROS otherwise negative.   Objective:  Blood pressure 103/75, pulse 75, height 5\' 2"  (1.575 m), weight 139 lb 14.4 oz (63.5 kg).  Body mass index is 25.59 kg/m.  General appearance: alert, cooperative and appears stated age Neck: no adenopathy, no carotid bruit, no JVD, supple, symmetrical, trachea midline and thyroid not enlarged, symmetric, no tenderness/mass/nodules Lungs: clear to auscultation bilaterally Breasts: normal appearance, no masses or tenderness Heart: regular rate and rhythm, S1, S2 normal, no murmur, click, rub or gallop Abdomen: soft, non-tender; bowel sounds normal; no masses,  no organomegaly Pelvic: cervix normal in appearance, external genitalia normal, no adnexal masses or tenderness, no cervical motion tenderness, rectovaginal septum normal, uterus normal size, shape, and consistency and vagina normal without discharge    Assessment:    Healthy female exam. ELEVATED tsh, FATIGUE,     Plan:  Labs  obtained- will follow up accordingly To add ashwaghanda and 5-HTP daily.   See After Visit Summary for Counseling Recommendations

## 2018-11-28 LAB — THYROID ANTIBODIES
Thyroglobulin Antibody: 1.7 IU/mL — ABNORMAL HIGH (ref 0.0–0.9)
Thyroperoxidase Ab SerPl-aCnc: 256 IU/mL — ABNORMAL HIGH (ref 0–34)

## 2018-11-28 LAB — THYROID PANEL WITH TSH
Free Thyroxine Index: 3.1 (ref 1.2–4.9)
T3 Uptake Ratio: 27 % (ref 24–39)
T4, Total: 11.6 ug/dL (ref 4.5–12.0)
TSH: 1.54 u[IU]/mL (ref 0.450–4.500)

## 2018-12-01 ENCOUNTER — Ambulatory Visit: Payer: Commercial Managed Care - PPO | Admitting: Family Medicine

## 2018-12-24 ENCOUNTER — Other Ambulatory Visit: Payer: Self-pay | Admitting: Obstetrics and Gynecology

## 2018-12-24 MED ORDER — PREDNISONE 5 MG PO TABS: 5.0000 mg | ORAL_TABLET | Freq: Every day | ORAL | 0 refills | Status: DC

## 2019-02-12 ENCOUNTER — Encounter: Payer: Self-pay | Admitting: Family Medicine

## 2019-02-12 DIAGNOSIS — E063 Autoimmune thyroiditis: Secondary | ICD-10-CM

## 2019-02-12 MED ORDER — LEVOTHYROXINE SODIUM 75 MCG PO TABS: 75.0000 ug | ORAL_TABLET | Freq: Every day | ORAL | 1 refills | Status: DC

## 2019-02-12 NOTE — Telephone Encounter (Signed)
Called Pt and scheduled her a lad appt fot 03/12/19 @ 3:45pm

## 2019-03-12 ENCOUNTER — Other Ambulatory Visit: Payer: Commercial Managed Care - PPO

## 2019-03-18 ENCOUNTER — Other Ambulatory Visit (INDEPENDENT_AMBULATORY_CARE_PROVIDER_SITE_OTHER): Payer: Commercial Managed Care - PPO

## 2019-03-18 ENCOUNTER — Other Ambulatory Visit: Payer: Self-pay

## 2019-03-18 DIAGNOSIS — E063 Autoimmune thyroiditis: Secondary | ICD-10-CM

## 2019-03-19 ENCOUNTER — Encounter: Payer: Self-pay | Admitting: Family Medicine

## 2019-03-19 LAB — THYROID PANEL WITH TSH
Free Thyroxine Index: 3.9 — ABNORMAL HIGH (ref 1.4–3.8)
T3 Uptake: 29 % (ref 22–35)
T4, Total: 13.4 ug/dL — ABNORMAL HIGH (ref 5.1–11.9)
TSH: 1.56 mIU/L

## 2019-03-23 ENCOUNTER — Other Ambulatory Visit: Payer: Self-pay | Admitting: Internal Medicine

## 2019-03-23 DIAGNOSIS — E063 Autoimmune thyroiditis: Secondary | ICD-10-CM

## 2019-03-23 MED ORDER — LEVOTHYROXINE SODIUM 88 MCG PO TABS: 88.0000 ug | ORAL_TABLET | Freq: Every day | ORAL | 0 refills | Status: DC

## 2019-04-15 ENCOUNTER — Other Ambulatory Visit: Payer: Self-pay

## 2019-04-15 ENCOUNTER — Encounter: Payer: Self-pay | Admitting: Family Medicine

## 2019-04-15 ENCOUNTER — Ambulatory Visit (INDEPENDENT_AMBULATORY_CARE_PROVIDER_SITE_OTHER): Payer: Commercial Managed Care - PPO | Admitting: Family Medicine

## 2019-04-15 VITALS — Ht 62.0 in | Wt 135.0 lb

## 2019-04-15 DIAGNOSIS — R21 Rash and other nonspecific skin eruption: Secondary | ICD-10-CM | POA: Diagnosis not present

## 2019-04-15 MED ORDER — PREDNISONE 10 MG (21) PO TBPK: ORAL_TABLET | ORAL | 0 refills | Status: DC

## 2019-04-15 MED ORDER — VALACYCLOVIR HCL 1 G PO TABS: 1000.0000 mg | ORAL_TABLET | Freq: Three times a day (TID) | ORAL | 0 refills | Status: DC

## 2019-04-15 NOTE — Progress Notes (Signed)
Patient ID: Susan Adkins, female   DOB: Oct 01, 1990, 28 y.o.   MRN: [id number]    Virtual Visit via video Note  This visit type was conducted due to national recommendations for restrictions regarding the COVID-19 pandemic (e.g. social distancing).  This format is felt to be most appropriate for this patient at this time.  All issues noted in this document were discussed and addressed.  No physical exam was performed (except for noted visual exam findings with Video Visits).   I connected with Susan Adkins today at  1:40 PM EDT by a video enabled telemedicine application or telephone and verified that I am speaking with the correct person using two identifiers. Location patient: home Location provider: work or home office Persons participating in the virtual visit: patient, provider  I discussed the limitations, risks, security and privacy concerns of performing an evaluation and management service by video and the availability of in person appointments. I also discussed with the patient that there may be a patient responsible charge related to this service. The patient expressed understanding and agreed to proceed.  HPI: Patient and I connected via video due to rash outbreak around mouth across forehead down arms.  Patient states she has had issues with this type of rash before and has been previously diagnosed with shingles and the rash presented in a similar way than as it is looking now.  Not sure if it is shingles, as it seems to be spreading.  First patient know she has never been diagnosed with any form of herpes.  Denies any exposure to new lotions, soaps, detergents or foods.  Patient uses mild products including things from [place] or Cetaphil due to having skin sensitivity.  No fever or chills or any other infectious type symptoms.  [place]  Only people she spends time with is her husband and 2 children.  ROS: See pertinent positives and  negatives per HPI.  Past Medical History:  Diagnosis Date  . Asthma    Well controlled.   . Cardiac arrhythmia due to congenital heart disease   . Chicken pox   . Depression   . Eating disorder   . Fainting episodes   . Fainting spell   . Frequent headaches   . Migraines    Well controlled on amitriptyline   . UTI (lower urinary tract infection)   . UTI (urinary tract infection)     Past Surgical History:  Procedure Laterality Date  . TONSILLECTOMY  2005  . TUBAL LIGATION Bilateral 02/03/2016   Procedure: BILATERAL TUBAL LIGATION;  Surgeon: Rubie Maid, MD;  Location: [facility];  Service: Gynecology;  Laterality: Bilateral;    Family History  Problem Relation Age of Onset  . Interstitial cystitis Mother   . Depression Mother   . Anxiety disorder Mother   . Bipolar disorder Mother   . Hypertension Father   . Cancer Maternal Grandmother 23       Breast cancer  . Depression Maternal Grandmother   . Asthma Maternal Grandfather   . Hypertension Paternal Grandmother    Social History   Tobacco Use  . Smoking status: Never Smoker  . Smokeless tobacco: Never Used  Substance Use Topics  . Alcohol use: Yes    Alcohol/week: 1.0 standard drinks    Types: 1 Standard drinks or equivalent per week    Current Outpatient Medications:  .  clobetasol ointment (TEMOVATE) 6.96 %, Apply 1 application topically 2 (two) times  daily. Apply to affected area, Disp: 30 g, Rfl: 2 .  escitalopram (LEXAPRO) 20 MG tablet, Take 1 tablet (20 mg total) by mouth daily., Disp: 90 tablet, Rfl: 3 .  fluconazole (DIFLUCAN) 100 MG tablet, Take 1 tablet (100 mg total) by mouth daily., Disp: 10 tablet, Rfl: 1 .  Galcanezumab-gnlm (EMGALITY) 120 MG/ML SOAJ, Inject into the skin., Disp: , Rfl:  .  Levonorgestrel-Ethinyl Estradiol (AMETHIA) 0.1-0.02 & 0.01 MG tablet, Take 1 tablet by mouth daily., Disp: 4 Package, Rfl: 4 .  levothyroxine (SYNTHROID) 88 MCG tablet, Take 1 tablet (88 mcg total) by mouth daily  before breakfast., Disp: 90 tablet, Rfl: 0 .  predniSONE (DELTASONE) 5 MG tablet, Take 1 tablet (5 mg total) by mouth daily with breakfast. Take 4 tab x 2 days, 3 tab x 2 days, 2 tab x 2 days, 1 tab x2 days, then stop, Disp: 20 tablet, Rfl: 0 .  propranolol ER (INDERAL LA) 80 MG 24 hr capsule, Take 1 capsule (80 mg total) by mouth daily., Disp: 90 capsule, Rfl: 3 .  traZODone (DESYREL) 100 MG tablet, Take 1 tablet (100 mg total) by mouth 2 (two) times daily., Disp: 180 tablet, Rfl: 3  EXAM:   GENERAL: alert, oriented, appears well and in no acute distress  HEENT: atraumatic, conjunttiva clear, no obvious abnormalities on inspection of external nose and ears  NECK: normal movements of the head and neck  LUNGS: on inspection no signs of respiratory distress, breathing rate appears normal, no obvious gross SOB, gasping or wheezing  CV: no obvious cyanosis  MS: moves all visible extremities without noticeable abnormality  Skin: Red raised rash across forehead around mouth some upper chest and down her arms.  Unclear reason for rash.  Does not appear consistent with shingles.  PSYCH/NEURO: pleasant and cooperative, no obvious depression or anxiety, speech and thought processing grossly intact  ASSESSMENT AND PLAN:  Discussed the following assessment and plan:  No problem-specific Assessment & Plan notes found for this encounter.  Rash-unclear reason for rash.  Due to patient having rash similar to this in the past and being treated with antiviral which did seem to help calm her down, we will do same treatment plan as some rashes can be caused by different viruses.  She will also take it steroid taper down to calm down inflammation.  Advised to use only mild products on skin and use moisturizer to keep skin from being too dry.  Advised if symptoms do not improve to let us know and we can plan to do referral to dermatology.   I discussed the assessment and treatment plan with the patient.  The patient was provided an opportunity to ask questions and all were answered. The patient agreed with the plan and demonstrated an understanding of the instructions.   The patient was advised to call back or seek an in-person evaluation if the symptoms worsen or if the condition fails to improve as anticipated.    Tracey Harries, FNP

## 2019-04-23 ENCOUNTER — Telehealth: Payer: Self-pay | Admitting: *Deleted

## 2019-04-23 ENCOUNTER — Encounter: Payer: Self-pay | Admitting: Family Medicine

## 2019-04-23 ENCOUNTER — Ambulatory Visit (INDEPENDENT_AMBULATORY_CARE_PROVIDER_SITE_OTHER): Payer: Commercial Managed Care - PPO | Admitting: Family Medicine

## 2019-04-23 DIAGNOSIS — R21 Rash and other nonspecific skin eruption: Secondary | ICD-10-CM

## 2019-04-23 DIAGNOSIS — F419 Anxiety disorder, unspecified: Secondary | ICD-10-CM

## 2019-04-23 DIAGNOSIS — L299 Pruritus, unspecified: Secondary | ICD-10-CM | POA: Diagnosis not present

## 2019-04-23 MED ORDER — PREDNISONE 10 MG (21) PO TBPK: ORAL_TABLET | ORAL | 0 refills | Status: DC

## 2019-04-23 MED ORDER — HYDROXYZINE HCL 10 MG PO TABS: 10.0000 mg | ORAL_TABLET | Freq: Three times a day (TID) | ORAL | 1 refills | Status: DC | PRN

## 2019-04-23 NOTE — Progress Notes (Signed)
Patient ID: Susan Adkins, female   DOB: Aug 15, 1990, 28 y.o.   MRN: [id number]    Virtual Visit via video Note  This visit type was conducted due to national recommendations for restrictions regarding the COVID-19 pandemic (e.g. social distancing).  This format is felt to be most appropriate for this patient at this time.  All issues noted in this document were discussed and addressed.  No physical exam was performed (except for noted visual exam findings with Video Visits).   I connected with [name] today at 10:40 AM EST by a video enabled telemedicine application or telephone and verified that I am speaking with the correct person using two identifiers. Location patient: home Location provider: work or home office Persons participating in the virtual visit: patient, provider  I discussed the limitations, risks, security and privacy concerns of performing an evaluation and management service by video and the availability of in person appointments. I also discussed with the patient that there may be a patient responsible charge related to this service. The patient expressed understanding and agreed to proceed.   HPI:  Patient and I connected via video due to a new rash.  Patient was treated for suspected viral type rash last week with success.  States that rash has subsided.  Now she has a rash on left upper chest right hip lower abdomen that looks almost hive-like.  States this rash is extremely itchy.  Patient does have a lot of stress in life due to caring for her 2 small children who both have autism.  Also has a history of multiple environmental allergies and food allergies.  Not sure if something she came into contact with recently she ate is causing her to flare and have this rash.  Denies any pustules or vesicles.  No fever or chills.  Denies any new soaps, lotions, detergents.  Mainly stays at home with her children and family, does not go many new places or interact with many new  people.   ROS: See pertinent positives and negatives per HPI.  Past Medical History:  Diagnosis Date  . Asthma    Well controlled.   . Cardiac arrhythmia due to congenital heart disease   . Chicken pox   . Depression   . Eating disorder   . Fainting episodes   . Fainting spell   . Frequent headaches   . Migraines    Well controlled on amitriptyline   . UTI (lower urinary tract infection)   . UTI (urinary tract infection)     Past Surgical History:  Procedure Laterality Date  . TONSILLECTOMY  2005  . TUBAL LIGATION Bilateral 02/03/2016   Procedure: BILATERAL TUBAL LIGATION;  Surgeon: Rubie Maid, MD;  Location: [facility];  Service: Gynecology;  Laterality: Bilateral;    Family History  Problem Relation Age of Onset  . Interstitial cystitis Mother   . Depression Mother   . Anxiety disorder Mother   . Bipolar disorder Mother   . Hypertension Father   . Cancer Maternal Grandmother 20       Breast cancer  . Depression Maternal Grandmother   . Asthma Maternal Grandfather   . Hypertension Paternal Grandmother    Social History   Tobacco Use  . Smoking status: Never Smoker  . Smokeless tobacco: Never Used  Substance Use Topics  . Alcohol use: Yes    Alcohol/week: 1.0 standard drinks    Types: 1 Standard drinks or equivalent per week    Current Outpatient Medications:  .  clobetasol ointment (TEMOVATE) 0.05 %, Apply 1 application topically 2 (two) times daily. Apply to affected area, Disp: 30 g, Rfl: 2 .  escitalopram (LEXAPRO) 20 MG tablet, Take 1 tablet (20 mg total) by mouth daily., Disp: 90 tablet, Rfl: 3 .  fluconazole (DIFLUCAN) 100 MG tablet, Take 1 tablet (100 mg total) by mouth daily., Disp: 10 tablet, Rfl: 1 .  Galcanezumab-gnlm (EMGALITY) 120 MG/ML SOAJ, Inject into the skin., Disp: , Rfl:  .  Levonorgestrel-Ethinyl Estradiol (AMETHIA) 0.1-0.02 & 0.01 MG tablet, Take 1 tablet by mouth daily., Disp: 4 Package, Rfl: 4 .  levothyroxine (SYNTHROID) 88 MCG  tablet, Take 1 tablet (88 mcg total) by mouth daily before breakfast., Disp: 90 tablet, Rfl: 0 .  propranolol ER (INDERAL LA) 80 MG 24 hr capsule, Take 1 capsule (80 mg total) by mouth daily., Disp: 90 capsule, Rfl: 3 .  traZODone (DESYREL) 100 MG tablet, Take 1 tablet (100 mg total) by mouth 2 (two) times daily., Disp: 180 tablet, Rfl: 3  EXAM:  GENERAL: alert, oriented, appears well and in no acute distress  HEENT: atraumatic, conjunttiva clear, no obvious abnormalities on inspection of external nose and ears  NECK: normal movements of the head and neck  LUNGS: on inspection no signs of respiratory distress, breathing rate appears normal, no obvious gross SOB, gasping or wheezing  CV: no obvious cyanosis  MS: moves all visible extremities without noticeable abnormality  SKIN: Faint red raised patchy areas on left upper chest, right hip, ABD. ?hive like. +itching  PSYCH/NEURO: pleasant and cooperative, no obvious depression or anxiety, speech and thought processing grossly intact  ASSESSMENT AND PLAN:  Discussed the following assessment and plan:  Rash and nonspecific skin eruption - Plan: Ambulatory referral to Dermatology, Ambulatory referral to Allergy, predniSONE (STERAPRED UNI-PAK 21 TAB) 10 MG (21) TBPK tablet, hydrOXYzine (ATARAX/VISTARIL) 10 MG tablet  Itching - Plan: predniSONE (STERAPRED UNI-PAK 21 TAB) 10 MG (21) TBPK tablet, hydrOXYzine (ATARAX/VISTARIL) 10 MG tablet  Unclear reason for this rash.  She will do steroid taper and use hydroxyzine for itching.  We will plan to refer her both dermatology and allergy to figure out if she is allergic to something and or if dermatology can help assist [place] in determining cause of rash.    I discussed the assessment and treatment plan with the patient. The patient was provided an opportunity to ask questions and all were answered. The patient agreed with the plan and demonstrated an understanding of the instructions.   The  patient was advised to call back or seek an in-person evaluation if the symptoms worsen or if the condition fails to improve as anticipated.  Tracey Harries, FNP

## 2019-04-23 NOTE — Telephone Encounter (Signed)
Patient had appointment at 10:40 with PCP.

## 2019-04-23 NOTE — Telephone Encounter (Signed)
Copied from [place] [id number]. Topic: General - Other >> Apr 23, 2019  9:13 AM Rainey Pines A wrote: Patient would like a callback from nurse in regards to the rash she was seen for a week ago. Patient stated that rash is spreading. Please advise.

## 2019-04-23 NOTE — Telephone Encounter (Signed)
I spoke with patient & she stated that she had finished the prednisone as well as acylovir. Her rash (possibly shingles) had cleared up. She stated that had dried up & looked much better. Now she had a new rash that she thought possibly was hives. I have scheduled a virtual visit.

## 2019-04-23 NOTE — Telephone Encounter (Signed)
Copied from [place] [id number]. Topic: Referral - Request for Referral >> Apr 23, 2019  9:14 AM Rainey Pines A wrote: Has patient seen PCP for this complaint? {Yes *If NO, is insurance requiring patient see PCP for this issue before PCP can refer them? Referral for which specialty: Allergist Preferred provider/office: [facility] Reason for referral: Rash

## 2019-04-27 MED ORDER — BUSPIRONE HCL 10 MG PO TABS: 10.0000 mg | ORAL_TABLET | Freq: Two times a day (BID) | ORAL | 2 refills | Status: DC

## 2019-04-27 NOTE — Telephone Encounter (Signed)
Referral already in  I usually leave location open and referral coordinator sends it   LG

## 2019-04-27 NOTE — Telephone Encounter (Signed)
Patient called to request that her referral be sent to [facility] instead of [facility] or [facility].  This would be more convenient for the patient . Please advise

## 2019-05-03 ENCOUNTER — Ambulatory Visit: Payer: Self-pay | Admitting: Pediatrics

## 2019-05-04 ENCOUNTER — Other Ambulatory Visit: Payer: Self-pay

## 2019-05-04 ENCOUNTER — Encounter: Payer: Self-pay | Admitting: Pediatrics

## 2019-05-04 ENCOUNTER — Ambulatory Visit (INDEPENDENT_AMBULATORY_CARE_PROVIDER_SITE_OTHER): Payer: Commercial Managed Care - PPO | Admitting: Pediatrics

## 2019-05-04 VITALS — BP 102/66 | HR 79 | Temp 98.1°F | Resp 16 | Ht 62.0 in | Wt 151.0 lb

## 2019-05-04 DIAGNOSIS — T7800XD Anaphylactic reaction due to unspecified food, subsequent encounter: Secondary | ICD-10-CM

## 2019-05-04 DIAGNOSIS — E739 Lactose intolerance, unspecified: Secondary | ICD-10-CM

## 2019-05-04 DIAGNOSIS — L508 Other urticaria: Secondary | ICD-10-CM | POA: Diagnosis not present

## 2019-05-04 DIAGNOSIS — J4540 Moderate persistent asthma, uncomplicated: Secondary | ICD-10-CM

## 2019-05-04 DIAGNOSIS — H1010 Acute atopic conjunctivitis, unspecified eye: Secondary | ICD-10-CM

## 2019-05-04 DIAGNOSIS — L503 Dermatographic urticaria: Secondary | ICD-10-CM

## 2019-05-04 DIAGNOSIS — L500 Allergic urticaria: Secondary | ICD-10-CM

## 2019-05-04 DIAGNOSIS — E063 Autoimmune thyroiditis: Secondary | ICD-10-CM

## 2019-05-04 DIAGNOSIS — J301 Allergic rhinitis due to pollen: Secondary | ICD-10-CM | POA: Diagnosis not present

## 2019-05-04 MED ORDER — EPINEPHRINE 0.3 MG/0.3ML IJ SOAJ: INTRAMUSCULAR | 1 refills | Status: DC

## 2019-05-04 MED ORDER — FLUTICASONE PROPIONATE 50 MCG/ACT NA SUSP: NASAL | 5 refills | Status: DC

## 2019-05-04 MED ORDER — ALBUTEROL SULFATE HFA 108 (90 BASE) MCG/ACT IN AERS: 2.0000 | INHALATION_SPRAY | RESPIRATORY_TRACT | 1 refills | Status: DC | PRN

## 2019-05-04 MED ORDER — FAMOTIDINE 20 MG PO TABS: ORAL_TABLET | ORAL | 5 refills | Status: DC

## 2019-05-04 MED ORDER — MONTELUKAST SODIUM 10 MG PO TABS: ORAL_TABLET | ORAL | 5 refills | Status: DC

## 2019-05-04 NOTE — Patient Instructions (Addendum)
Environmental control of dust mite Nasal saline irrigations followed by fluticasone 2 sprays per nostril once a day if needed for stuffy nose Opcon-A-1 drop  3 times a day if needed for itchy eyes   Zyrtec 10 mg-take 1 tablet twice a day for itching or runny nose Famotidine 20 mg-take 1 tablet twice a day-it may help hives Do foods with salicylates make you itch or have hives? Avoid wheat products for now until we  see how you respond to this treatment for your hives  Montelukast 10 mg-take 1 tablet once a day to prevent coughing or wheezing Ventolin 2 puffs every 4 hours if needed for wheezing or coughing spells.  You may use Ventolin 2 puffs 5 to 15 minutes before exercise Symbicort 80 -2 puffs every 12 hours to prevent coughing or wheezing   I recommend that your family doctor switch Inderal  to a beta 1 blocker such as Tenormin which would not affect your lungs adversely.  If you have an allergic reaction take Benadryl 50 mg every 4 hours and if you have life-threatening symptoms inject with Auvi-Q 0.3 mg.  Then write what you had to eat or drink in the previous 4 hours   Call us if you're not doing well on this treatment plan I gave you instructions regarding lactose intolerance I will call you with the results of your blood work Continue on your other medications

## 2019-05-04 NOTE — Progress Notes (Signed)
[address] [place] [id number] Dept: [phone]  New Patient Note  Patient ID: Susan Adkins Krisher, female    DOB: 01/06/1991  Age: 28 y.o. MRN: [id number] Date of Office Visit: 05/04/2019 Referring provider: Tracey HarriesGuse, Lauren M, FNP [address] [place],  [place] [id number]    Chief Complaint: Food Intolerance  HPI Susan Adkins Felling presents for an allergy evaluation..  She has had hives for several years but her hives have been worse during the past 3 months..  Sometimes when she has hives she has noticed some fullness in the chest but no swelling of her tongue or throat.  Her hives are worse after eating.  She does have asthma and is on Symbicort 80-2 puffs every 12 hours .  Sometimes she has discomfort in her abdomen when she has hives.  Milk products give her bloating and nausea.. She has a history of eczema.  She has not had tick bites.  She has had asthma since middle school.  She is on Symbicort 80-2 puffs every 12 hours but it does not control her exercise-induced asthma..  She has had perennial allergic rhinitis since childhood.  She has about 3 sinus infections per year  She has a history of tachycardia controlled by Inderal  She has Hashimoto's thyroiditis and an enlarged thyroid gland.  She has needed prednisone about once a month for her thyroiditis.  She has thyroid peroxidase and thyroglobulin antibodies   Review of Systems  Constitutional: Negative.   HENT:       Seasonal allergic rhinitis for many years.  Tonsillectomy  Eyes:       Itchy eyes at times  Respiratory:       Asthma since middle school  Cardiovascular:       Tachycardia controlled with Inderal  Gastrointestinal: Negative.   Genitourinary: Negative.   Musculoskeletal: Negative.   Skin:       Urticaria for several years but worse over the past few months.  History of eczema in childhood  Neurological:       History of migraine headaches  Endo/Heme/Allergies:       Hashimoto's  thyroiditis.  No diabetes  Psychiatric/Behavioral: Negative.     Outpatient Encounter Medications as of 05/04/2019  Medication Sig  . budesonide-formoterol (SYMBICORT) 80-4.5 MCG/ACT inhaler Inhale 2 puffs into the lungs daily as needed.  . busPIRone (BUSPAR) 10 MG tablet Take 1 tablet (10 mg total) by mouth 2 (two) times daily.  Marland Kitchen. escitalopram (LEXAPRO) 20 MG tablet Take 1 tablet (20 mg total) by mouth daily.  . Levonorgestrel-Ethinyl Estradiol (CAMRESE LO) 0.1-0.02 & 0.01 MG tablet Take 1 tablet by mouth daily.  . propranolol ER (INDERAL LA) 80 MG 24 hr capsule Take 1 capsule (80 mg total) by mouth daily.  Marland Kitchen. thyroid (ARMOUR) 60 MG tablet Take 60 mg by mouth daily before breakfast.  . traZODone (DESYREL) 100 MG tablet Take 1 tablet (100 mg total) by mouth 2 (two) times daily.  Marland Kitchen. albuterol (VENTOLIN HFA) 108 (90 Base) MCG/ACT inhaler Inhale 2 puffs into the lungs every 4 (four) hours as needed for wheezing or shortness of breath.  . EPINEPHrine (AUVI-Q) 0.3 mg/0.3 mL IJ SOAJ injection Use as directed for severe allergic reactions.  . famotidine (PEPCID) 20 MG tablet Take 1 tablet twice a day-it may help hives.  . fluticasone (FLONASE) 50 MCG/ACT nasal spray 2 sprays per nostril once a day if needed for stuffy nose  . montelukast (SINGULAIR) 10 MG tablet Take 1  tablet once a day for coughing or wheezing.  . [DISCONTINUED] clobetasol ointment (TEMOVATE) 0.62 % Apply 1 application topically 2 (two) times daily. Apply to affected area  . [DISCONTINUED] fluconazole (DIFLUCAN) 100 MG tablet Take 1 tablet (100 mg total) by mouth daily.  . [DISCONTINUED] Galcanezumab-gnlm (EMGALITY) 120 MG/ML SOAJ Inject into the skin.  . [DISCONTINUED] hydrOXYzine (ATARAX/VISTARIL) 10 MG tablet Take 1 tablet (10 mg total) by mouth 3 (three) times daily as needed for itching.  . [DISCONTINUED] Levonorgestrel-Ethinyl Estradiol (AMETHIA) 0.1-0.02 & 0.01 MG tablet Take 1 tablet by mouth daily.  . [DISCONTINUED]  levothyroxine (SYNTHROID) 88 MCG tablet Take 1 tablet (88 mcg total) by mouth daily before breakfast.  . [DISCONTINUED] predniSONE (STERAPRED UNI-PAK 21 TAB) 10 MG (21) TBPK tablet Take according to pack instructions   No facility-administered encounter medications on file as of 05/04/2019.      Drug Allergies:  No Known Allergies  Family History: Hibo's family history includes Anxiety disorder in her mother; Asthma in her maternal grandfather; Bipolar disorder in her mother; Cancer (age of onset: 66) in her maternal grandmother; Depression in her maternal grandmother and mother; Hypertension in her father and paternal grandmother; Interstitial cystitis in her mother..  Family history is positive for hayfever, sinus problems, eczema and food allergies.  Family history is negative for angioedema, chronic urticaria, , lupus, chronic bronchitis or emphysema.  Social and environmental.  She has cats and dogs at home.  She is not exposed to cigarette smoking.  She has never smoked cigarettes in the past.  She works at home.  Physical Exam: BP 102/66   Pulse 79   Temp 98.1 F (36.7 C) (Oral)   Resp 16   Ht 5\' 2"  (1.575 Adkins)   Wt 151 lb 0.2 oz (68.5 kg)   SpO2 98%   BMI 27.62 kg/Adkins    Physical Exam Vitals signs reviewed.  Constitutional:      Appearance: Normal appearance. She is normal weight.  HENT:     Head:     Comments: Eyes normal.  Ears normal.  Nose normal.  Pharynx normal. Neck:     Musculoskeletal: Neck supple.     Comments: Her thyroid gland was diffusely enlarged Cardiovascular:     Comments: S1-S2 normal no murmurs Pulmonary:     Comments: Clear to percussion and auscultation Abdominal:     Palpations: Abdomen is soft.     Tenderness: There is no abdominal tenderness.     Comments: No hepatosplenomegaly  Lymphadenopathy:     Cervical: No cervical adenopathy.  Skin:    Comments: Clear but she had dermographia  Neurological:     General: No focal deficit present.      Mental Status: She is alert and oriented to person, place, and time. Mental status is at baseline.  Psychiatric:        Mood and Affect: Mood normal.        Behavior: Behavior normal.        Thought Content: Thought content normal.        Judgment: Judgment normal.     Diagnostics: FVC 3.08 L FEV1 2.37 L.  Predicted FVC 3.52 L predicted FEV1 to 3.03 L.  After albuterol 2 puffs FVC 3.16 L FEV1 2.63 L-this shows a mild reduction in the FEV1 with an 11% improvement after albuterol  Allergy skin test were extremely positive to grass pollens, tree pollens , one  weed pollen, dust mite.  Mild reactivity to molds dog and cockroach.  Skin testing to foods including wheat was negative   Assessment  Assessment and Plan: 1. Anaphylactic reaction due to food, subsequent encounter   2. Chronic urticaria   3. Moderate persistent asthma without complication   4. Seasonal allergic rhinitis due to pollen   5. Seasonal allergic conjunctivitis   6. Allergic urticaria   7. Hashimoto's thyroiditis   8. Dermographia   9. Lactose intolerance     Meds ordered this encounter  Medications  . famotidine (PEPCID) 20 MG tablet    Sig: Take 1 tablet twice a day-it may help hives.    Dispense:  64 tablet    Refill:  5  . fluticasone (FLONASE) 50 MCG/ACT nasal spray    Sig: 2 sprays per nostril once a day if needed for stuffy nose    Dispense:  16 g    Refill:  5  . montelukast (SINGULAIR) 10 MG tablet    Sig: Take 1 tablet once a day for coughing or wheezing.    Dispense:  34 tablet    Refill:  5  . albuterol (VENTOLIN HFA) 108 (90 Base) MCG/ACT inhaler    Sig: Inhale 2 puffs into the lungs every 4 (four) hours as needed for wheezing or shortness of breath.    Dispense:  18 g    Refill:  1  . EPINEPHrine (AUVI-Q) 0.3 mg/0.3 mL IJ SOAJ injection    Sig: Use as directed for severe allergic reactions.    Dispense:  2 each    Refill:  1    Patient Instructions  Environmental control of dust  mite Nasal saline irrigations followed by fluticasone 2 sprays per nostril once a day if needed for stuffy nose Opcon-A-1 drop  3 times a day if needed for itchy eyes   Zyrtec 10 mg-take 1 tablet twice a day for itching or runny nose Famotidine 20 mg-take 1 tablet twice a day-it may help hives Do foods with salicylates make you itch or have hives? Avoid wheat products for now until we  see how you respond to this treatment for your hives  Montelukast 10 mg-take 1 tablet once a day to prevent coughing or wheezing Ventolin 2 puffs every 4 hours if needed for wheezing or coughing spells.  You may use Ventolin 2 puffs 5 to 15 minutes before exercise Symbicort 80 -2 puffs every 12 hours to prevent coughing or wheezing   I recommend that your family doctor switch Inderal  to a beta 1 blocker such as Tenormin which would not affect your lungs adversely.  If you have an allergic reaction take Benadryl 50 mg every 4 hours and if you have life-threatening symptoms inject with Auvi-Q 0.3 mg.  Then write what you had to eat or drink in the previous 4 hours   Call us if you're not doing well on this treatment plan I gave you instructions regarding lactose intolerance I will call you with the results of your blood work Continue on your other medications   Return in about 4 weeks (around 06/01/2019).   Thank you for the opportunity to care for this patient.  Please do not hesitate to contact me with questions.  Tonette Bihari, Adkins.D.  [facility] [address] [place], [place] [address] [phone]

## 2019-05-24 LAB — COMPREHENSIVE METABOLIC PANEL WITH GFR
ALT: 11 IU/L (ref 0–32)
AST: 14 IU/L (ref 0–40)
Albumin/Globulin Ratio: 1.8 (ref 1.2–2.2)
Albumin: 4.1 g/dL (ref 3.9–5.0)
Alkaline Phosphatase: 95 IU/L (ref 39–117)
BUN/Creatinine Ratio: 9 (ref 9–23)
BUN: 9 mg/dL (ref 6–20)
Bilirubin Total: 0.2 mg/dL (ref 0.0–1.2)
CO2: 24 mmol/L (ref 20–29)
Calcium: 9.5 mg/dL (ref 8.7–10.2)
Chloride: 104 mmol/L (ref 96–106)
Creatinine, Ser: 1.05 mg/dL — ABNORMAL HIGH (ref 0.57–1.00)
GFR calc Af Amer: 84 mL/min/1.73
GFR calc non Af Amer: 73 mL/min/1.73
Globulin, Total: 2.3 g/dL (ref 1.5–4.5)
Glucose: 78 mg/dL (ref 65–99)
Potassium: 4.4 mmol/L (ref 3.5–5.2)
Sodium: 139 mmol/L (ref 134–144)
Total Protein: 6.4 g/dL (ref 6.0–8.5)

## 2019-05-24 LAB — CBC WITH DIFFERENTIAL/PLATELET
Basophils Absolute: 0 x10E3/uL (ref 0.0–0.2)
Basos: 1 %
EOS (ABSOLUTE): 0.1 x10E3/uL (ref 0.0–0.4)
Eos: 1 %
Hematocrit: 41.8 % (ref 34.0–46.6)
Hemoglobin: 13.8 g/dL (ref 11.1–15.9)
Immature Grans (Abs): 0 x10E3/uL (ref 0.0–0.1)
Immature Granulocytes: 0 %
Lymphocytes Absolute: 1.8 x10E3/uL (ref 0.7–3.1)
Lymphs: 32 %
MCH: 30.7 pg (ref 26.6–33.0)
MCHC: 33 g/dL (ref 31.5–35.7)
MCV: 93 fL (ref 79–97)
Monocytes Absolute: 0.3 x10E3/uL (ref 0.1–0.9)
Monocytes: 6 %
Neutrophils Absolute: 3.3 x10E3/uL (ref 1.4–7.0)
Neutrophils: 60 %
Platelets: 287 x10E3/uL (ref 150–450)
RBC: 4.5 x10E6/uL (ref 3.77–5.28)
RDW: 11.5 % — ABNORMAL LOW (ref 11.7–15.4)
WBC: 5.4 x10E3/uL (ref 3.4–10.8)

## 2019-05-24 LAB — ALPHA-GAL PANEL
Alpha Gal IgE*: 0.98 kU/L — ABNORMAL HIGH
Beef (Bos spp) IgE: 0.58 kU/L — ABNORMAL HIGH
Class Interpretation: 1
Class Interpretation: 1
Lamb/Mutton (Ovis spp) IgE: 0.2 kU/L
Pork (Sus spp) IgE: 0.39 kU/L — ABNORMAL HIGH

## 2019-05-24 LAB — ANA W/REFLEX IF POSITIVE: Anti Nuclear Antibody (ANA): NEGATIVE

## 2019-05-24 LAB — SEDIMENTATION RATE: Sed Rate: 2 mm/h (ref 0–32)

## 2019-07-06 ENCOUNTER — Telehealth: Payer: Self-pay | Admitting: Family Medicine

## 2019-07-06 DIAGNOSIS — F419 Anxiety disorder, unspecified: Secondary | ICD-10-CM

## 2019-07-06 MED ORDER — TRAZODONE HCL 100 MG PO TABS: 100.0000 mg | ORAL_TABLET | Freq: Two times a day (BID) | ORAL | 0 refills | Status: DC

## 2019-07-06 NOTE — Telephone Encounter (Signed)
I've sent the refill in for her.

## 2019-07-06 NOTE — Telephone Encounter (Signed)
Patient has made a transfer of care appointment with NP ok to fill trazodone til appointment ?

## 2019-07-06 NOTE — Telephone Encounter (Signed)
Patient needs a refill on traZODone (DESYREL) 100 MG tablet. Appt made with Ar[facility] for TOC.

## 2019-07-12 ENCOUNTER — Telehealth: Payer: Self-pay

## 2019-07-12 NOTE — Telephone Encounter (Signed)
Please inform patient that I looked through her previously prescribed medication list, the only medication that I saw that she has ever taken for 10 days was Dicloxacillin (usually used to treat thrush from breastfeeding).  I'm not sure if this is the medication she is talking about.  We usually don't use this antibiotic for much else. How long ago was the last time she used the medication?  Does she still have the bottle?

## 2019-07-12 NOTE — Telephone Encounter (Signed)
Pt has a periodic issue. Skin falls off vagina, area becomes irritated and begins to bleed. Patient says Melody completed a biopsy. Yeast was seen underneath the skin. Melody prescribed a 10 day antibiotic and patient felt relief immediately. Pls call patient to advise

## 2019-07-12 NOTE — Telephone Encounter (Signed)
Patient says she had the reaction last year in Feb.Patient says she was prescribed Dox for secondary skin infection & Difucan 10 days 2/5/202. Says she took both medications and skin was completely healed.

## 2019-07-13 NOTE — Telephone Encounter (Signed)
Pt is requesting a call from the nurse. Please advise.

## 2019-07-13 NOTE — Telephone Encounter (Signed)
So I had to go back to her chart and review again around the timeline she gave.  It looks like she was prescribed Tindamax (doxycyline) BID x 2 days to treat BV infection (per Mel's note) and then given Diflucan 100 mg daily for 10 days. We can prescribe this for her, but if her symptoms aren't starting to get better in a few days, she may need to come in for further evaluation.

## 2019-07-13 NOTE — Telephone Encounter (Signed)
Pt is having white discharge pt is still losing skin inside of for vagina. Pt is wanting a call back pt is wanting some meds sent in for it. please advise

## 2019-07-13 NOTE — Telephone Encounter (Signed)
Dr. Cherry, Please advise. Thanks Lisa-Marie Rueger 

## 2019-07-14 ENCOUNTER — Telehealth: Payer: Self-pay | Admitting: Obstetrics and Gynecology

## 2019-07-14 NOTE — Telephone Encounter (Signed)
Pt called again still waiting on a call from a nurse. Please advise

## 2019-07-15 MED ORDER — TINIDAZOLE 500 MG PO TABS: 500.0000 mg | ORAL_TABLET | Freq: Two times a day (BID) | ORAL | 0 refills | Status: DC

## 2019-07-15 MED ORDER — FLUCONAZOLE 150 MG PO TABS: 150.0000 mg | ORAL_TABLET | Freq: Once | ORAL | 0 refills | Status: DC

## 2019-07-15 NOTE — Telephone Encounter (Signed)
Please see another phone encounter.  

## 2019-07-15 NOTE — Telephone Encounter (Signed)
Pt called and is aware of information given by ac. Medication sent in the pharamcy and appt made for follow up treatment.

## 2019-07-28 ENCOUNTER — Ambulatory Visit: Payer: Self-pay | Admitting: Obstetrics and Gynecology

## 2019-07-30 ENCOUNTER — Other Ambulatory Visit: Payer: Self-pay

## 2019-07-30 ENCOUNTER — Ambulatory Visit (INDEPENDENT_AMBULATORY_CARE_PROVIDER_SITE_OTHER): Payer: Commercial Managed Care - PPO | Admitting: Family

## 2019-07-30 ENCOUNTER — Encounter: Payer: Self-pay | Admitting: Family

## 2019-07-30 DIAGNOSIS — F419 Anxiety disorder, unspecified: Secondary | ICD-10-CM | POA: Diagnosis not present

## 2019-07-30 DIAGNOSIS — F909 Attention-deficit hyperactivity disorder, unspecified type: Secondary | ICD-10-CM

## 2019-07-30 DIAGNOSIS — G43801 Other migraine, not intractable, with status migrainosus: Secondary | ICD-10-CM | POA: Diagnosis not present

## 2019-07-30 MED ORDER — BUSPIRONE HCL 10 MG PO TABS: 10.0000 mg | ORAL_TABLET | Freq: Three times a day (TID) | ORAL | 3 refills | Status: DC

## 2019-07-30 MED ORDER — AMPHETAMINE-DEXTROAMPHET ER 10 MG PO CP24: 10.0000 mg | ORAL_CAPSULE | Freq: Every day | ORAL | 0 refills | Status: DC

## 2019-07-30 NOTE — Assessment & Plan Note (Addendum)
In a long discussion we both shared hesitancy in starting Adderall particularly as her anxiety has been heightened of late.  However, part of her anxiety is that she cannot focus, and perform tasks.  I think it is reasonable to start the BuSpar first as patient I discussed today, in a week or so later she may start Adderall. We will start with a low-dose intentionally.  She will monitor for any worsening in her sleep pattern, anxiety.  Discussed that she will need to sign a controlled substance form in the office.  She understands to store this medication in safe place, particularly away from her children.  I looked up patient on [place] Controlled Substances Reporting System and saw no activity that raised concern of inappropriate use.

## 2019-07-30 NOTE — Assessment & Plan Note (Signed)
Well-controlled, continue current regimen 
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
## 2019-07-30 NOTE — Assessment & Plan Note (Signed)
Worsened of late however we discussed that this is may be related to untreated ADHD.  Will trial increase BuSpar to 3 times a day.  Close follow-up.

## 2019-08-03 ENCOUNTER — Other Ambulatory Visit: Payer: Self-pay | Admitting: Family

## 2019-08-03 DIAGNOSIS — F419 Anxiety disorder, unspecified: Secondary | ICD-10-CM

## 2019-08-04 ENCOUNTER — Telehealth: Payer: Self-pay

## 2019-08-04 NOTE — Telephone Encounter (Signed)
PA approved for Adderall XR 10MG  08/04/19-08/03/20.

## 2019-08-18 ENCOUNTER — Telehealth: Payer: Self-pay | Admitting: Family

## 2019-08-18 ENCOUNTER — Telehealth (INDEPENDENT_AMBULATORY_CARE_PROVIDER_SITE_OTHER): Payer: Commercial Managed Care - PPO | Admitting: Family Medicine

## 2019-08-18 ENCOUNTER — Other Ambulatory Visit: Payer: Self-pay

## 2019-08-18 DIAGNOSIS — J40 Bronchitis, not specified as acute or chronic: Secondary | ICD-10-CM

## 2019-08-18 DIAGNOSIS — R059 Cough, unspecified: Secondary | ICD-10-CM

## 2019-08-18 DIAGNOSIS — R05 Cough: Secondary | ICD-10-CM

## 2019-08-18 MED ORDER — AZITHROMYCIN 250 MG PO TABS: ORAL_TABLET | ORAL | 0 refills | Status: DC

## 2019-08-18 MED ORDER — BENZONATATE 100 MG PO CAPS: 100.0000 mg | ORAL_CAPSULE | Freq: Three times a day (TID) | ORAL | 1 refills | Status: DC | PRN

## 2019-08-18 NOTE — Telephone Encounter (Signed)
Pt has had a low grade fever and really bad cough. She said her son had whooping cough a week ago but he is better now and now she has a cough. She is just wanting something called in for the cough if possible if not she would like a virtual. She tried Robitussin but it didn't help. Please advise.

## 2019-08-18 NOTE — Telephone Encounter (Signed)
I called and spoke with patient. She has not been tested for Covid due to her son being tested last week in ED & had whooping cough. She has a temp of 99.8, watery eyes, runny nose but mainly a bad cough. Patient was not labored or SOB in way when I was speaking to her. She was frustrated because I suggested an appointment with UC or VV with them due to no appointments here in clinic. She said that she wanted something called in for cough then stated that she really wanted to try a zpak to see if that would knock out her cough.   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 It was going to be her telling someone exactly what she was telling me. She said that she hated that Lauren left because she could call & tell Leotis Shames was going on then Lauren would just send what was needed.  Please advise?
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
## 2019-08-18 NOTE — Telephone Encounter (Signed)
Patient was able to get a virtual visit with Dr. Clent Ridges today at [facility].

## 2019-08-18 NOTE — Telephone Encounter (Signed)
Call pt  I agree that she needs an appt to be evaluated.    I can call in tessalon perles for cough however if she is concerned for bacterial infection, she would need to be seen at urgent care as you advised.   Please advise any SOB, trouble breathing or trouble staying hydrated, she would need to immediately be seen.

## 2019-08-24 ENCOUNTER — Telehealth: Payer: Self-pay | Admitting: Certified Nurse Midwife

## 2019-08-24 NOTE — Telephone Encounter (Signed)
Called patient to reschedule her appointment she had cancelled. Left a message for patient  to return my call.

## 2019-08-27 ENCOUNTER — Encounter: Payer: Self-pay | Admitting: Family

## 2019-08-30 ENCOUNTER — Telehealth: Payer: Self-pay | Admitting: Family

## 2019-08-30 DIAGNOSIS — F909 Attention-deficit hyperactivity disorder, unspecified type: Secondary | ICD-10-CM

## 2019-08-30 MED ORDER — AMPHETAMINE-DEXTROAMPHET ER 15 MG PO CP24: 15.0000 mg | ORAL_CAPSULE | ORAL | 0 refills | Status: DC

## 2019-08-30 NOTE — Telephone Encounter (Signed)
I looked up patient on [place] Controlled Substances Reporting System and saw no activity that raised concern of inappropriate use.   

## 2019-09-25 IMAGING — CT CT ABD-PELV W/ CM
2 of 4 series · 16 of 46 positions shown, 18 images · IV contrast (APPLIED)
Comparison: None available.

CLINICAL DATA: Initial evaluation for increased abdominal bloating
over several days. Right lower quadrant pain.

EXAM:
CT ABDOMEN AND PELVIS WITH CONTRAST
TECHNIQUE: Multidetector CT imaging of the abdomen and pelvis was performed
using the standard protocol following bolus administration of
intravenous contrast.
CONTRAST:  100mL 2014FW-NWW IOPAMIDOL (2014FW-NWW) INJECTION 61%

[Series 2: routine abd/pel with · axial · 0.65mm/px · z∈[-409,+1]mm · 13 of 90 slices shown, 15 images]
[im 4/90  soft-tissue]
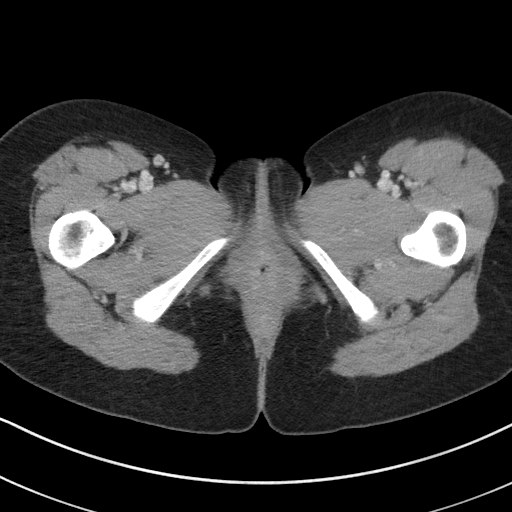
[im 4/90  bone]
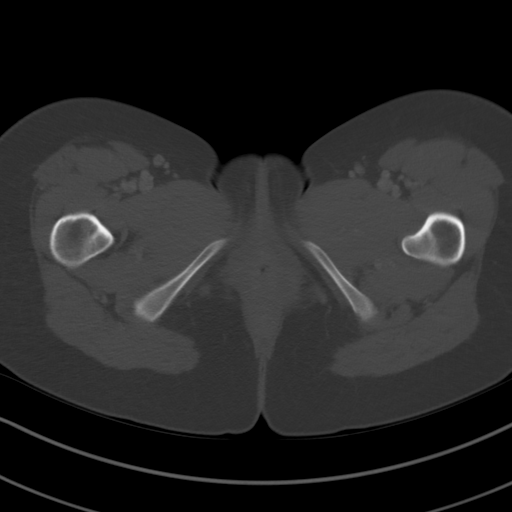
[im 11/90  soft-tissue]
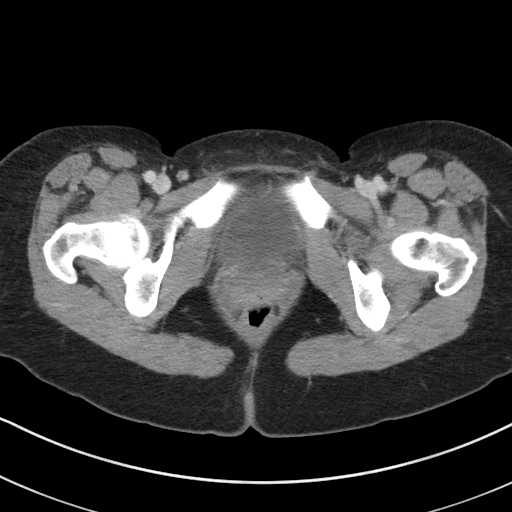
[im 18/90  soft-tissue]
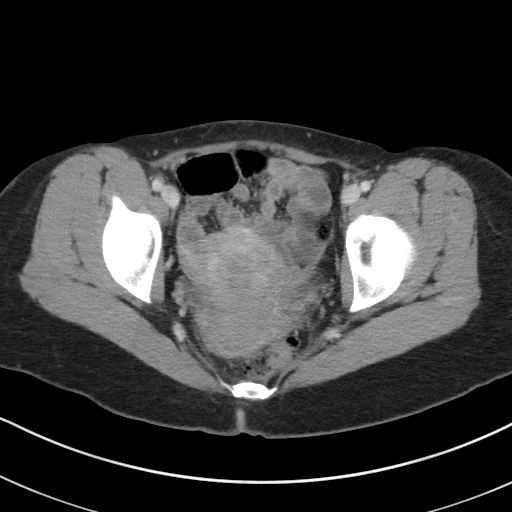
[im 25/90  soft-tissue]
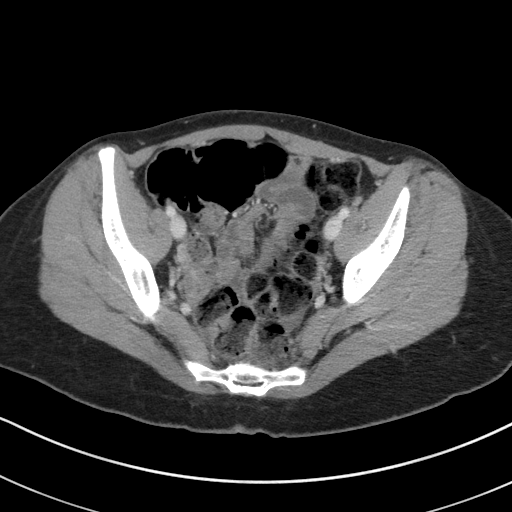
[im 33/90  soft-tissue]
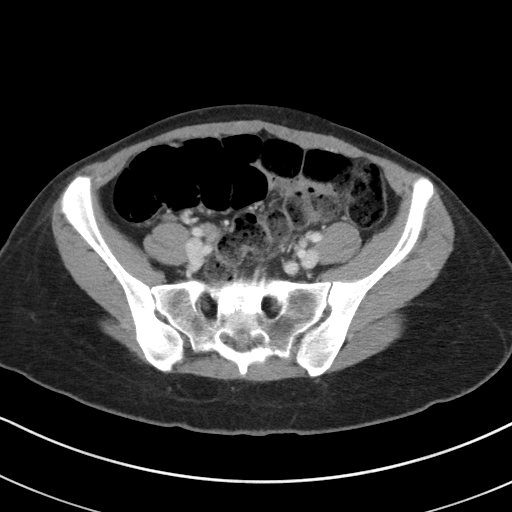
[im 40/90  soft-tissue]
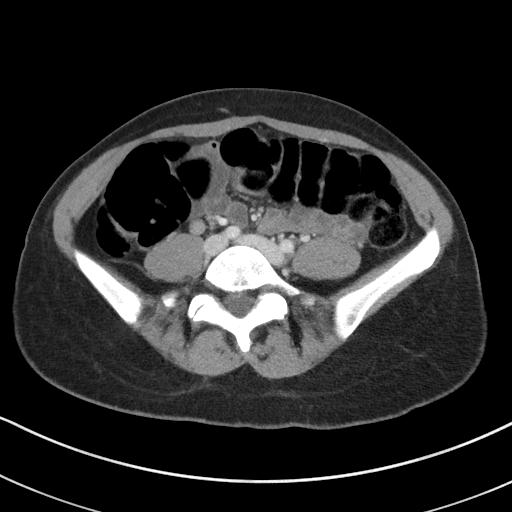
[im 47/90  soft-tissue]
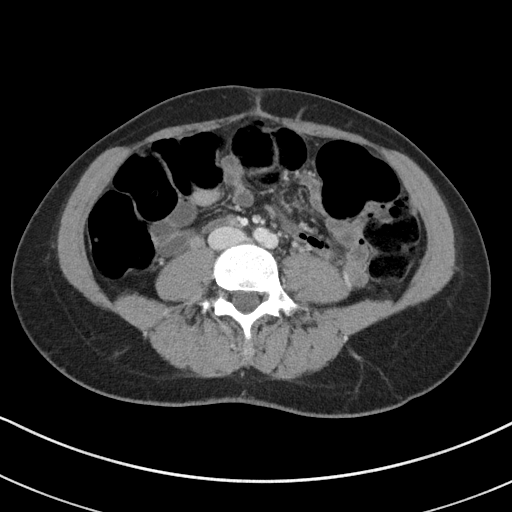
[im 50/90  soft-tissue]
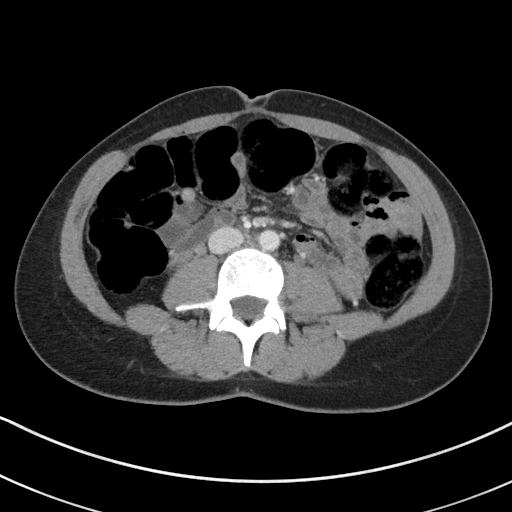
[im 57/90  soft-tissue]
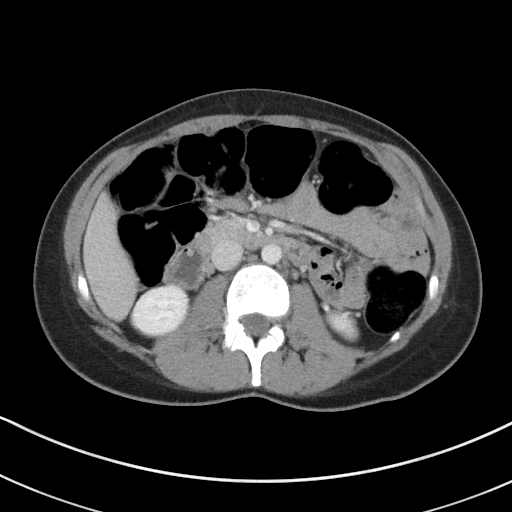
[im 57/90  bone]
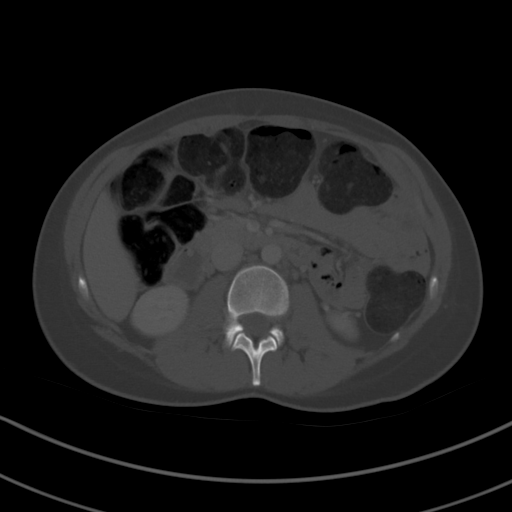
[im 65/90  soft-tissue]
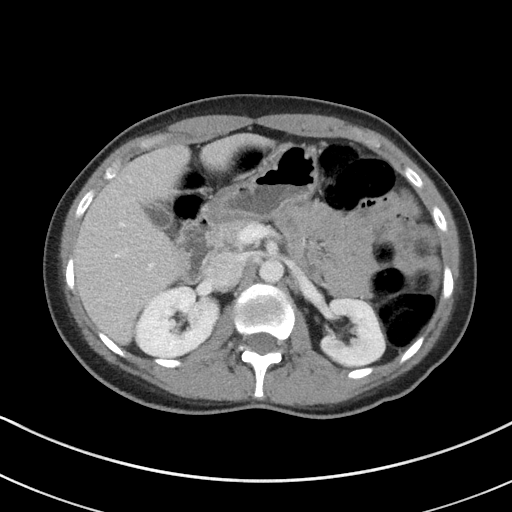
[im 72/90  soft-tissue]
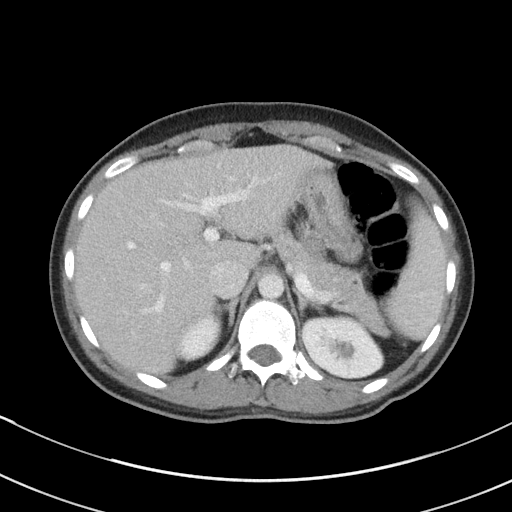
[im 79/90  soft-tissue]
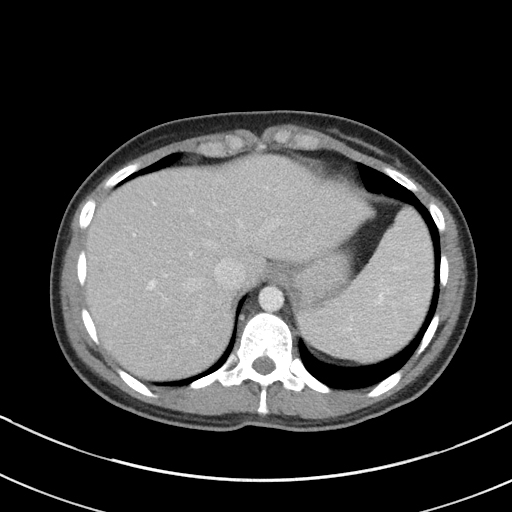
[im 86/90  soft-tissue]
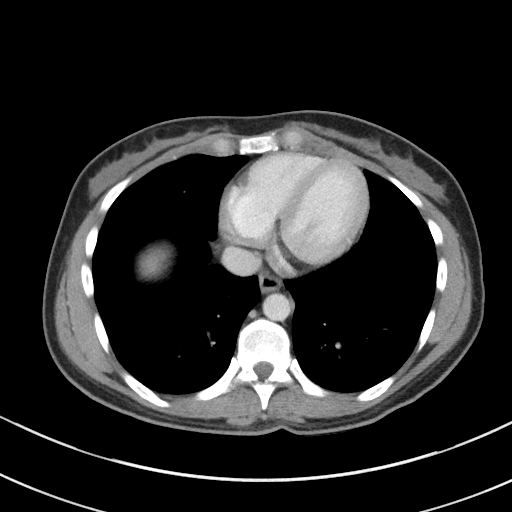

[Series 5: coronal st · coronal · 0.67mm/px · 3 of 71 slices shown]
[im 24/71  soft-tissue]
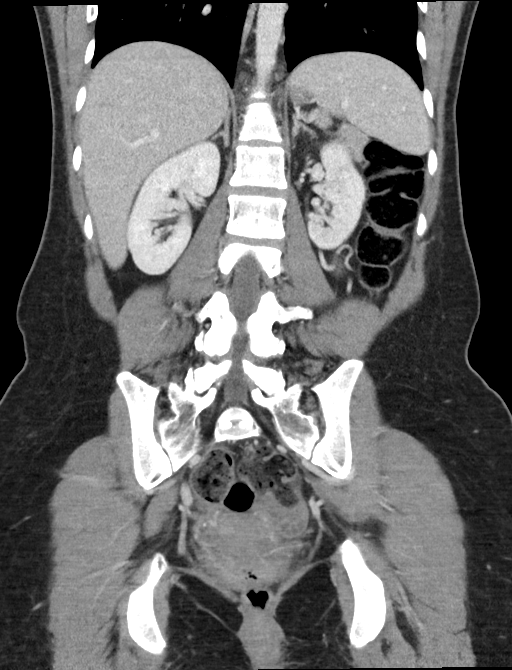
[im 32/71  soft-tissue]
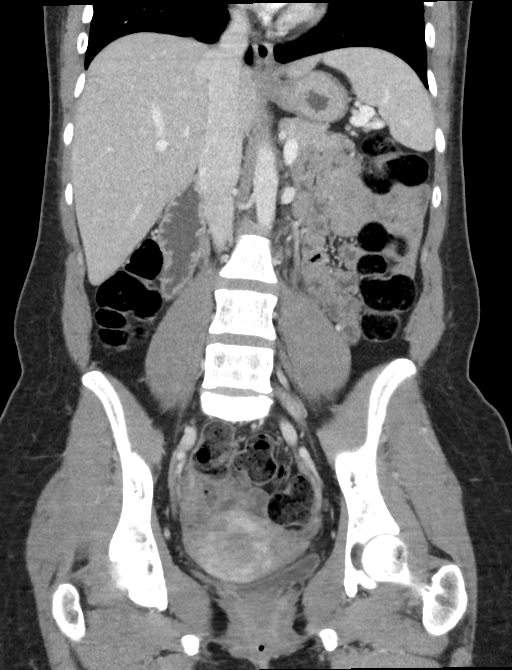
[im 39/71  soft-tissue]
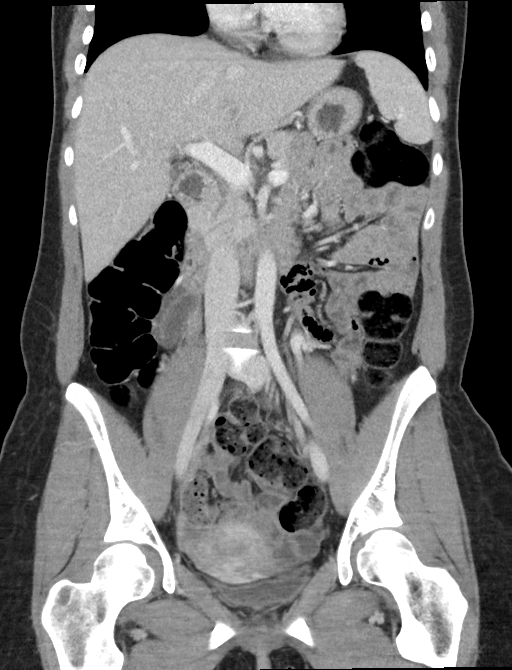

[16 of 46 positions shown; findings below may reference images not displayed]

FINDINGS: Lower chest: Visualized lung bases are clear.

Hepatobiliary: 9 mm hypodensity within the subcapsular left hepatic
lobe noted, indeterminate, but of doubtful clinical significance.
Liver otherwise unremarkable. Gallbladder within normal limits. No
biliary dilatation.

Pancreas: Pancreas within normal limits.

Spleen: Spleen within normal limits.

Adrenals/Urinary Tract: Adrenal glands are normal. Kidneys equal in
size with symmetric enhancement. No nephrolithiasis, hydronephrosis,
or focal enhancing renal mass. No appreciable hydroureter. Bladder
largely decompressed without acute abnormality.

Stomach/Bowel: Stomach within normal limits. No evidence for bowel
obstruction. Small tubular structure extending posteriorly from the
cecum favored to reflect the appendix, within normal limits with no
findings to suggest acute appendicitis. Large volume stool diffusely
throughout the colon, suggesting constipation. Mild fecalization of
the ileum suggesting slow transit. No other acute inflammatory
changes about the bowels.

Vascular/Lymphatic: Normal intravascular enhancement seen throughout
the intra-abdominal aorta. Mesenteric vessels patent proximally. No
adenopathy.

Reproductive: Uterus and ovaries within normal limits for age.

Other: No free intraperitoneal air. Trace free physiologic fluid
within the pelvis.

Musculoskeletal: No acute osseous abnormality. No worrisome lytic or
blastic osseous lesions.
IMPRESSION: 1. Large volume stool diffusely throughout the colon, suggesting
constipation.
2. No other acute intra-abdominal or pelvic process. No evidence for
obstruction. No other acute inflammatory changes about the bowels.

## 2019-10-01 ENCOUNTER — Encounter: Payer: Self-pay | Admitting: Family

## 2019-10-01 ENCOUNTER — Other Ambulatory Visit: Payer: Self-pay | Admitting: Family

## 2019-10-01 DIAGNOSIS — F909 Attention-deficit hyperactivity disorder, unspecified type: Secondary | ICD-10-CM

## 2019-10-01 MED ORDER — AMPHETAMINE-DEXTROAMPHET ER 20 MG PO CP24: 20.0000 mg | ORAL_CAPSULE | ORAL | 0 refills | Status: DC

## 2019-10-01 NOTE — Progress Notes (Signed)
I looked up patient on [place] Controlled Substances Reporting System PMP AWARE and saw no activity that raised concern of inappropriate use.   

## 2019-10-04 ENCOUNTER — Other Ambulatory Visit: Payer: Self-pay | Admitting: Family

## 2019-10-04 DIAGNOSIS — F909 Attention-deficit hyperactivity disorder, unspecified type: Secondary | ICD-10-CM

## 2019-10-18 ENCOUNTER — Encounter: Payer: Self-pay | Admitting: Family

## 2019-10-18 ENCOUNTER — Other Ambulatory Visit: Payer: Self-pay

## 2019-10-18 DIAGNOSIS — G43709 Chronic migraine without aura, not intractable, without status migrainosus: Secondary | ICD-10-CM

## 2019-10-18 MED ORDER — PROPRANOLOL HCL ER 80 MG PO CP24: 80.0000 mg | ORAL_CAPSULE | Freq: Every day | ORAL | 3 refills | Status: DC

## 2019-10-18 NOTE — Telephone Encounter (Signed)
Pt called to get a refill on propranolol ER (INDERAL LA) 80 MG 24 hr capsule sent to [facility]  Pt is completely out

## 2019-10-27 ENCOUNTER — Other Ambulatory Visit: Payer: Self-pay

## 2019-10-27 ENCOUNTER — Encounter: Payer: Self-pay | Admitting: Family

## 2019-10-27 ENCOUNTER — Other Ambulatory Visit (HOSPITAL_COMMUNITY)
Admission: RE | Admit: 2019-10-27 | Discharge: 2019-10-27 | Disposition: A | Discharge status: Home / Self Care | Payer: Commercial Managed Care - PPO | Source: Ambulatory Visit | Attending: Family | Admitting: Family

## 2019-10-27 ENCOUNTER — Ambulatory Visit (INDEPENDENT_AMBULATORY_CARE_PROVIDER_SITE_OTHER): Payer: Commercial Managed Care - PPO | Admitting: Family

## 2019-10-27 VITALS — BP 116/70 | HR 71 | Temp 97.4°F | Ht 62.0 in | Wt 163.2 lb

## 2019-10-27 DIAGNOSIS — Z0000 Encounter for general adult medical examination without abnormal findings: Secondary | ICD-10-CM | POA: Insufficient documentation

## 2019-10-27 DIAGNOSIS — F909 Attention-deficit hyperactivity disorder, unspecified type: Secondary | ICD-10-CM | POA: Diagnosis not present

## 2019-10-27 DIAGNOSIS — F419 Anxiety disorder, unspecified: Secondary | ICD-10-CM

## 2019-10-27 LAB — COMPREHENSIVE METABOLIC PANEL
ALT: 18 U/L (ref 0–35)
AST: 19 U/L (ref 0–37)
Albumin: 3.9 g/dL (ref 3.5–5.2)
Alkaline Phosphatase: 81 U/L (ref 39–117)
BUN: 13 mg/dL (ref 6–23)
CO2: 28 mEq/L (ref 19–32)
Calcium: 9.2 mg/dL (ref 8.4–10.5)
Chloride: 104 mEq/L (ref 96–112)
Creatinine, Ser: 0.9 mg/dL (ref 0.40–1.20)
GFR: 74.46 mL/min (ref >60.00)
Glucose, Bld: 103 mg/dL — ABNORMAL HIGH (ref 70–99)
Potassium: 4.2 mEq/L (ref 3.5–5.1)
Sodium: 137 mEq/L (ref 135–145)
Total Bilirubin: 0.3 mg/dL (ref 0.2–1.2)
Total Protein: 6.7 g/dL (ref 6.0–8.3)

## 2019-10-27 LAB — CBC WITH DIFFERENTIAL/PLATELET
Basophils Absolute: 0 10*3/uL (ref 0.0–0.1)
Basophils Relative: 0.5 % (ref 0.0–3.0)
Eosinophils Absolute: 0.1 10*3/uL (ref 0.0–0.7)
Eosinophils Relative: 1.3 % (ref 0.0–5.0)
HCT: 41.2 % (ref 36.0–46.0)
Hemoglobin: 13.8 g/dL (ref 12.0–15.0)
Lymphocytes Relative: 25.9 % (ref 12.0–46.0)
Lymphs Abs: 1.3 10*3/uL (ref 0.7–4.0)
MCHC: 33.5 g/dL (ref 30.0–36.0)
MCV: 93 fl (ref 78.0–100.0)
Monocytes Absolute: 0.3 10*3/uL (ref 0.1–1.0)
Monocytes Relative: 5.2 % (ref 3.0–12.0)
Neutro Abs: 3.5 10*3/uL (ref 1.4–7.7)
Neutrophils Relative %: 67.1 % (ref 43.0–77.0)
Platelets: 248 10*3/uL (ref 150.0–400.0)
RBC: 4.43 Mil/uL (ref 3.87–5.11)
RDW: 11.7 % (ref 11.5–15.5)
WBC: 5.2 10*3/uL (ref 4.0–10.5)

## 2019-10-27 LAB — LIPID PANEL
Cholesterol: 167 mg/dL (ref 0–200)
HDL: 57.4 mg/dL (ref >39.00)
LDL Cholesterol: 85 mg/dL (ref 0–99)
NonHDL: 109.69
Total CHOL/HDL Ratio: 3
Triglycerides: 122 mg/dL (ref 0.0–149.0)
VLDL: 24.4 mg/dL (ref 0.0–40.0)

## 2019-10-27 LAB — VITAMIN D 25 HYDROXY (VIT D DEFICIENCY, FRACTURES): VITD: 40.47 ng/mL (ref 30.00–100.00)

## 2019-10-27 LAB — TSH: TSH: 0.08 u[IU]/mL — ABNORMAL LOW (ref 0.35–4.50)

## 2019-10-27 LAB — HEMOGLOBIN A1C: Hgb A1c MFr Bld: 5.1 % (ref 4.6–6.5)

## 2019-10-27 NOTE — Patient Instructions (Signed)
Always a pleasure seeing you!   Health Maintenance, Female Adopting a healthy lifestyle and getting preventive care are important in promoting health and wellness. Ask your health care provider about:  The right schedule for you to have regular tests and exams.  Things you can do on your own to prevent diseases and keep yourself healthy. What should I know about diet, weight, and exercise? Eat a healthy diet   Eat a diet that includes plenty of vegetables, fruits, low-fat dairy products, and lean protein.  Do not eat a lot of foods that are high in solid fats, added sugars, or sodium. Maintain a healthy weight Body mass index (BMI) is used to identify weight problems. It estimates body fat based on height and weight. Your health care provider can help determine your BMI and help you achieve or maintain a healthy weight. Get regular exercise Get regular exercise. This is one of the most important things you can do for your health. Most adults should:  Exercise for at least 150 minutes each week. The exercise should increase your heart rate and make you sweat (moderate-intensity exercise).  Do strengthening exercises at least twice a week. This is in addition to the moderate-intensity exercise.  Spend less time sitting. Even light physical activity can be beneficial. Watch cholesterol and blood lipids Have your blood tested for lipids and cholesterol at 28 years of age, then have this test every 5 years. Have your cholesterol levels checked more often if:  Your lipid or cholesterol levels are high.  You are older than 28 years of age.  You are at high risk for heart disease. What should I know about cancer screening? Depending on your health history and family history, you may need to have cancer screening at various ages. This may include screening for:  Breast cancer.  Cervical cancer.  Colorectal cancer.  Skin cancer.  Lung cancer. What should I know about heart  disease, diabetes, and high blood pressure? Blood pressure and heart disease  High blood pressure causes heart disease and increases the risk of stroke. This is more likely to develop in people who have high blood pressure readings, are of African descent, or are overweight.  Have your blood pressure checked: ? Every 3-5 years if you are 18-39 years of age. ? Every year if you are 40 years old or older. Diabetes Have regular diabetes screenings. This checks your fasting blood sugar level. Have the screening done:  Once every three years after age 40 if you are at a normal weight and have a low risk for diabetes.  More often and at a younger age if you are overweight or have a high risk for diabetes. What should I know about preventing infection? Hepatitis B If you have a higher risk for hepatitis B, you should be screened for this virus. Talk with your health care provider to find out if you are at risk for hepatitis B infection. Hepatitis C Testing is recommended for:  Everyone born from 1945 through 1965.  Anyone with known risk factors for hepatitis C. Sexually transmitted infections (STIs)  Get screened for STIs, including gonorrhea and chlamydia, if: ? You are sexually active and are younger than 28 years of age. ? You are older than 28 years of age and your health care provider tells you that you are at risk for this type of infection. ? Your sexual activity has changed since you were last screened, and you are at increased risk for chlamydia or   gonorrhea. Ask your health care provider if you are at risk.  Ask your health care provider about whether you are at high risk for HIV. Your health care provider may recommend a prescription medicine to help prevent HIV infection. If you choose to take medicine to prevent HIV, you should first get tested for HIV. You should then be tested every 3 months for as long as you are taking the medicine. Pregnancy  If you are about to stop  having your period (premenopausal) and you may become pregnant, seek counseling before you get pregnant.  Take 400 to 800 micrograms (mcg) of folic acid every day if you become pregnant.  Ask for birth control (contraception) if you want to prevent pregnancy. Osteoporosis and menopause Osteoporosis is a disease in which the bones lose minerals and strength with aging. This can result in bone fractures. If you are 65 years old or older, or if you are at risk for osteoporosis and fractures, ask your health care provider if you should:  Be screened for bone loss.  Take a calcium or vitamin D supplement to lower your risk of fractures.  Be given hormone replacement therapy (HRT) to treat symptoms of menopause. Follow these instructions at home: Lifestyle  Do not use any products that contain nicotine or tobacco, such as cigarettes, e-cigarettes, and chewing tobacco. If you need help quitting, ask your health care provider.  Do not use street drugs.  Do not share needles.  Ask your health care provider for help if you need support or information about quitting drugs. Alcohol use  Do not drink alcohol if: ? Your health care provider tells you not to drink. ? You are pregnant, may be pregnant, or are planning to become pregnant.  If you drink alcohol: ? Limit how much you use to 0-1 drink a day. ? Limit intake if you are breastfeeding.  Be aware of how much alcohol is in your drink. In the [place] one drink equals one 12 oz bottle of beer (355 mL), one 5 oz glass of wine (148 mL), or one 1 oz glass of hard liquor (44 mL). General instructions  Schedule regular health, dental, and eye exams.  Stay current with your vaccines.  Tell your health care provider if: ? You often feel depressed. ? You have ever been abused or do not feel safe at home. Summary  Adopting a healthy lifestyle and getting preventive care are important in promoting health and wellness.  Follow your health  care provider's instructions about healthy diet, exercising, and getting tested or screened for diseases.  Follow your health care provider's instructions on monitoring your cholesterol and blood pressure. This information is not intended to replace advice given to you by your health care provider. Make sure you discuss any questions you have with your health care provider. Document Revised: 05/27/2018 Document Reviewed: 05/27/2018 Elsevier Patient Education  2020 Elsevier Inc.   
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
## 2019-10-28 ENCOUNTER — Encounter: Payer: Self-pay | Admitting: Family

## 2019-10-29 ENCOUNTER — Telehealth: Payer: Self-pay

## 2019-10-29 LAB — PMP SCREEN PROFILE (10S), URINE
Amphetamine Scrn, Ur: POSITIVE ng/mL — AB
BARBITURATE SCREEN URINE: NEGATIVE ng/mL
BENZODIAZEPINE SCREEN, URINE: NEGATIVE ng/mL
CANNABINOIDS UR QL SCN: NEGATIVE ng/mL
Cocaine (Metab) Scrn, Ur: NEGATIVE ng/mL
Creatinine(Crt), U: 248.5 mg/dL (ref 20.0–300.0)
Methadone Screen, Urine: NEGATIVE ng/mL
OXYCODONE+OXYMORPHONE UR QL SCN: NEGATIVE ng/mL
Opiate Scrn, Ur: NEGATIVE ng/mL
Ph of Urine: 5.5 (ref 4.5–8.9)
Phencyclidine Qn, Ur: NEGATIVE ng/mL
Propoxyphene Scrn, Ur: NEGATIVE ng/mL

## 2019-10-29 LAB — CYTOLOGY - PAP: Diagnosis: NEGATIVE

## 2019-10-29 NOTE — Assessment & Plan Note (Addendum)
Improved.  Patient questions whether or not she needs Lexapro at all.  At this time we deferred making any changes although patient will continue to consider this

## 2019-10-29 NOTE — Assessment & Plan Note (Signed)
Clinical breast exam and Pap smear performed today. 

## 2019-10-29 NOTE — Assessment & Plan Note (Addendum)
Controlled.  Controlled substance  contract in place, urine drug screen as expected.  Patient feels symptoms are much improved on Adderall.  She was formally tested in the past however this cannot find formal paperwork as so long ago.  We agreed to continue medication until she is formally retested again as long as she is not experiencing side effects of medication and continues 4-month follow-up appointments.

## 2019-10-29 NOTE — Telephone Encounter (Signed)
LMTCB patient needs to call back to sign contact for adderall. Contract at my desk.

## 2019-10-29 NOTE — Progress Notes (Signed)
I have printed contract & LM for patient to come into office to sign.

## 2019-11-12 ENCOUNTER — Encounter: Payer: Self-pay | Admitting: Family

## 2019-11-15 ENCOUNTER — Telehealth: Payer: Self-pay | Admitting: Family

## 2019-11-15 DIAGNOSIS — F909 Attention-deficit hyperactivity disorder, unspecified type: Secondary | ICD-10-CM

## 2019-11-16 ENCOUNTER — Other Ambulatory Visit: Payer: Self-pay | Admitting: Family

## 2019-11-16 DIAGNOSIS — F909 Attention-deficit hyperactivity disorder, unspecified type: Secondary | ICD-10-CM

## 2019-11-16 MED ORDER — AMPHETAMINE-DEXTROAMPHET ER 20 MG PO CP24: 20.0000 mg | ORAL_CAPSULE | Freq: Every morning | ORAL | 0 refills | Status: DC

## 2019-11-16 NOTE — Telephone Encounter (Signed)
Scanning is not UTD. CSC signed in May, not in chart.   I looked up patient on [place] Controlled Substances Reporting System PMP AWARE and saw no activity that raised concern of inappropriate use.

## 2019-11-17 ENCOUNTER — Encounter: Payer: Self-pay | Admitting: Family

## 2019-11-17 NOTE — Telephone Encounter (Signed)
LM with patient that adderall was sent to [facility]. Receipt was confirmed. I asked that she call pharmacy & if they still stated that didn't receive then call us back.

## 2019-11-17 NOTE — Telephone Encounter (Signed)
Patient went yesterday to pick up her medication and the pharmacy said they did not have her amphetamine-dextroamphetamine (ADDERALL XR) 20 MG 24 hr capsule. Looks like it was sent yesterday morning but pharmacy said they don't have the prescribtion.

## 2019-11-18 ENCOUNTER — Telehealth: Payer: Self-pay | Admitting: Nurse Practitioner

## 2019-11-18 ENCOUNTER — Encounter: Payer: Self-pay | Admitting: Nurse Practitioner

## 2019-11-18 ENCOUNTER — Telehealth (INDEPENDENT_AMBULATORY_CARE_PROVIDER_SITE_OTHER): Payer: Commercial Managed Care - PPO | Admitting: Nurse Practitioner

## 2019-11-18 ENCOUNTER — Other Ambulatory Visit: Payer: Self-pay

## 2019-11-18 VITALS — BP 114/74 | HR 83 | Temp 99.4°F | Ht 62.0 in | Wt 162.0 lb

## 2019-11-18 DIAGNOSIS — J329 Chronic sinusitis, unspecified: Secondary | ICD-10-CM | POA: Diagnosis not present

## 2019-11-18 MED ORDER — AMOXICILLIN-POT CLAVULANATE 875-125 MG PO TABS: 1.0000 | ORAL_TABLET | Freq: Two times a day (BID) | ORAL | 0 refills | Status: AC

## 2019-11-18 NOTE — Patient Instructions (Addendum)
You have not had any of the Covid vaccines, I would recommend a Covid test. If you call your pharmacy they might be able to schedule it today for when you pick up your medication.  Please begin treatment for sinus infection with Augmentin and take as directed for 7 days.  Pick up Afrin over-the-counter which is a topical nasal decongestant and use as directed for only 3 days.  This helps open up the sinuses and the eustachian tube.  Since she did not do well with Mucinex D, this may be more helpful for you.  Use the nasal saline spray as recommended by your allergist.  Continue with your steroid nose spray Flonase as this can you help decrease swelling and inflammation on a local level.    You may continue with your antihistamine as needed for chronic allergies.    Rest, drink extra fluids, and monitor for worsening symptoms.  Please seek treatment in walk-in clinic for in- person care if symptoms do not improve over the next 2 days.  Sinusitis, Adult Sinusitis is inflammation of your sinuses. Sinuses are hollow spaces in the bones around your face. Your sinuses are located:  Around your eyes.  In the middle of your forehead.  Behind your nose.  In your cheekbones. Mucus normally drains out of your sinuses. When your nasal tissues become inflamed or swollen, mucus can become trapped or blocked. This allows bacteria, viruses, and fungi to grow, which leads to infection. Most infections of the sinuses are caused by a virus. Sinusitis can develop quickly. It can last for up to 4 weeks (acute) or for more than 12 weeks (chronic). Sinusitis often develops after a cold. What are the causes? This condition is caused by anything that creates swelling in the sinuses or stops mucus from draining. This includes:  Allergies.  Asthma.  Infection from bacteria or viruses.  Deformities or blockages in your nose or sinuses.  Abnormal growths in the nose (nasal polyps).  Pollutants, such as  chemicals or irritants in the air.  Infection from fungi (rare). What increases the risk? You are more likely to develop this condition if you:  Have a weak body defense system (immune system).  Do a lot of swimming or diving.  Overuse nasal sprays.  Smoke. What are the signs or symptoms? The main symptoms of this condition are pain and a feeling of pressure around the affected sinuses. Other symptoms include:  Stuffy nose or congestion.  Thick drainage from your nose.  Swelling and warmth over the affected sinuses.  Headache.  Upper toothache.  A cough that may get worse at night.  Extra mucus that collects in the throat or the back of the nose (postnasal drip).  Decreased sense of smell and taste.  Fatigue.  A fever.  Sore throat.  Bad breath. How is this diagnosed? This condition is diagnosed based on:  Your symptoms.  Your medical history.  A physical exam.  Tests to find out if your condition is acute or chronic. This may include: ? Checking your nose for nasal polyps. ? Viewing your sinuses using a device that has a light (endoscope). ? Testing for allergies or bacteria. ? Imaging tests, such as an MRI or CT scan. In rare cases, a bone biopsy may be done to rule out more serious types of fungal sinus disease. How is this treated? Treatment for sinusitis depends on the cause and whether your condition is chronic or acute.  If caused by a virus, your  symptoms should go away on their own within 10 days. You may be given medicines to relieve symptoms. They include: ? Medicines that shrink swollen nasal passages (topical intranasal decongestants). ? Medicines that treat allergies (antihistamines). ? A spray that eases inflammation of the nostrils (topical intranasal corticosteroids). ? Rinses that help get rid of thick mucus in your nose (nasal saline washes).  If caused by bacteria, your health care provider may recommend waiting to see if your  symptoms improve. Most bacterial infections will get better without antibiotic medicine. You may be given antibiotics if you have: ? A severe infection. ? A weak immune system.  If caused by narrow nasal passages or nasal polyps, you may need to have surgery. Follow these instructions at home: Medicines  Take, use, or apply over-the-counter and prescription medicines only as told by your health care provider. These may include nasal sprays.  If you were prescribed an antibiotic medicine, take it as told by your health care provider. Do not stop taking the antibiotic even if you start to feel better. Hydrate and humidify   Drink enough fluid to keep your urine pale yellow. Staying hydrated will help to thin your mucus.  Use a cool mist humidifier to keep the humidity level in your home above 50%.  Inhale steam for 10-15 minutes, 3-4 times a day, or as told by your health care provider. You can do this in the bathroom while a hot shower is running.  Limit your exposure to cool or dry air. Rest  Rest as much as possible.  Sleep with your head raised (elevated).  Make sure you get enough sleep each night. General instructions   Apply a warm, moist washcloth to your face 3-4 times a day or as told by your health care provider. This will help with discomfort.  Wash your hands often with soap and water to reduce your exposure to germs. If soap and water are not available, use hand sanitizer.  Do not smoke. Avoid being around people who are smoking (secondhand smoke).  Keep all follow-up visits as told by your health care provider. This is important. Contact a health care provider if:  You have a fever.  Your symptoms get worse.  Your symptoms do not improve within 10 days. Get help right away if:  You have a severe headache.  You have persistent vomiting.  You have severe pain or swelling around your face or eyes.  You have vision problems.  You develop  confusion.  Your neck is stiff.  You have trouble breathing. Summary  Sinusitis is soreness and inflammation of your sinuses. Sinuses are hollow spaces in the bones around your face.  This condition is caused by nasal tissues that become inflamed or swollen. The swelling traps or blocks the flow of mucus. This allows bacteria, viruses, and fungi to grow, which leads to infection.  If you were prescribed an antibiotic medicine, take it as told by your health care provider. Do not stop taking the antibiotic even if you start to feel better.  Keep all follow-up visits as told by your health care provider. This is important. This information is not intended to replace advice given to you by your health care provider. Make sure you discuss any questions you have with your health care provider. Document Revised: 11/03/2017 Document Reviewed: 11/03/2017 Elsevier Patient Education  [place].
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
## 2019-11-19 ENCOUNTER — Encounter: Payer: Self-pay | Admitting: Nurse Practitioner

## 2019-11-19 ENCOUNTER — Telehealth (INDEPENDENT_AMBULATORY_CARE_PROVIDER_SITE_OTHER): Payer: Commercial Managed Care - PPO | Admitting: Nurse Practitioner

## 2019-11-19 VITALS — Ht 62.0 in | Wt 163.0 lb

## 2019-11-19 DIAGNOSIS — T781XXA Other adverse food reactions, not elsewhere classified, initial encounter: Secondary | ICD-10-CM

## 2019-11-19 DIAGNOSIS — Z8619 Personal history of other infectious and parasitic diseases: Secondary | ICD-10-CM | POA: Diagnosis not present

## 2019-11-19 DIAGNOSIS — L989 Disorder of the skin and subcutaneous tissue, unspecified: Secondary | ICD-10-CM | POA: Diagnosis not present

## 2019-11-19 MED ORDER — VALACYCLOVIR HCL 1 G PO TABS: 1000.0000 mg | ORAL_TABLET | Freq: Three times a day (TID) | ORAL | 0 refills | Status: AC
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
## 2019-11-20 ENCOUNTER — Encounter: Payer: Self-pay | Admitting: Nurse Practitioner

## 2019-11-20 DIAGNOSIS — L989 Disorder of the skin and subcutaneous tissue, unspecified: Secondary | ICD-10-CM | POA: Insufficient documentation

## 2019-11-20 DIAGNOSIS — T781XXA Other adverse food reactions, not elsewhere classified, initial encounter: Secondary | ICD-10-CM | POA: Insufficient documentation

## 2019-11-20 DIAGNOSIS — Z8619 Personal history of other infectious and parasitic diseases: Secondary | ICD-10-CM | POA: Insufficient documentation

## 2019-11-21 NOTE — Telephone Encounter (Signed)
Addendum: 11/21/2019:  Please call her :  1. Would she go to the [facility] lab and get Varicella Zoster titers and HSV titers drawn? It is very unusual for someone to have 6 outbreaks of Zoster in their lifetime. I am looking at the titers to help diagnose this.   2. Can she let her alpha gal- Allergist know what is going with her rash? I don't think alpha-gal rash is painful,  more like hives I understand, but he may want to know.   3, Also, can she send in pictures through MyChart of her zoster rash now? All of the sites. I would like to know if it has spread or looks differently now. And, perhaps her Allergist can see them as well. Thank you.

## 2019-11-22 NOTE — Telephone Encounter (Signed)
Patient is feeling better; she started taking the augmentin when it was prescribed. She did get covid tested and that was negative. She states her rash is starting to dry up and she will take a picture as soon as she can and upload it to [employer]. She agrees to go to [facility] lab to have labs drawn and will discuss her rash with her allergist.

## 2019-11-23 ENCOUNTER — Other Ambulatory Visit
Admission: RE | Admit: 2019-11-23 | Discharge: 2019-11-23 | Disposition: A | Discharge status: Home / Self Care | Payer: Commercial Managed Care - PPO | Source: Ambulatory Visit | Attending: Nurse Practitioner | Admitting: Nurse Practitioner

## 2019-11-23 DIAGNOSIS — L989 Disorder of the skin and subcutaneous tissue, unspecified: Secondary | ICD-10-CM | POA: Diagnosis not present

## 2019-11-23 DIAGNOSIS — Z8619 Personal history of other infectious and parasitic diseases: Secondary | ICD-10-CM

## 2019-11-25 LAB — HSV 1 AND 2 IGM ABS, INDIRECT
HSV 1 IgM Antibodies: <1:10 {titer}
HSV 2 IgM Antibodies: <1:10 {titer}

## 2019-11-25 LAB — HSV 2 ANTIBODY, IGG: HSV 2 Glycoprotein G Ab, IgG: <0.91 index (ref 0.00–0.90)

## 2019-11-26 LAB — VARICELLA-ZOSTER BY PCR: Varicella-Zoster, PCR: NEGATIVE

## 2019-12-02 ENCOUNTER — Encounter: Payer: Commercial Managed Care - PPO | Admitting: Certified Nurse Midwife

## 2019-12-06 ENCOUNTER — Other Ambulatory Visit: Payer: Self-pay | Admitting: Family

## 2019-12-06 ENCOUNTER — Telehealth: Payer: Self-pay | Admitting: Certified Nurse Midwife

## 2019-12-06 ENCOUNTER — Encounter: Payer: Self-pay | Admitting: Family

## 2019-12-06 NOTE — Telephone Encounter (Signed)
Called pt lmtrc for annual.

## 2019-12-06 NOTE — Telephone Encounter (Signed)
Camrese has been filled on 12/06/19

## 2019-12-07 ENCOUNTER — Telehealth: Payer: Self-pay

## 2019-12-07 NOTE — Telephone Encounter (Signed)
This patient is getting her OCPs refilled by Rennie Plowman FNP.

## 2019-12-31 ENCOUNTER — Telehealth: Payer: Self-pay | Admitting: Certified Nurse Midwife
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
## 2020-01-03 ENCOUNTER — Encounter: Payer: Self-pay | Admitting: Family

## 2020-01-04 ENCOUNTER — Telehealth: Payer: Self-pay | Admitting: Family

## 2020-01-04 DIAGNOSIS — F909 Attention-deficit hyperactivity disorder, unspecified type: Secondary | ICD-10-CM

## 2020-01-04 NOTE — Telephone Encounter (Signed)
I looked up patient on [place] Controlled Substances Reporting System PMP AWARE and saw no activity that raised concern of inappropriate use.   

## 2020-01-05 ENCOUNTER — Telehealth: Payer: Self-pay | Admitting: Family

## 2020-01-05 ENCOUNTER — Other Ambulatory Visit: Payer: Self-pay

## 2020-01-05 ENCOUNTER — Other Ambulatory Visit: Payer: Self-pay | Admitting: Family

## 2020-01-05 ENCOUNTER — Encounter: Payer: Self-pay | Admitting: Certified Nurse Midwife

## 2020-01-05 ENCOUNTER — Ambulatory Visit (INDEPENDENT_AMBULATORY_CARE_PROVIDER_SITE_OTHER): Payer: Commercial Managed Care - PPO | Admitting: Certified Nurse Midwife

## 2020-01-05 VITALS — BP 107/71 | HR 71 | Ht 62.0 in | Wt 167.5 lb

## 2020-01-05 DIAGNOSIS — R29898 Other symptoms and signs involving the musculoskeletal system: Secondary | ICD-10-CM | POA: Diagnosis not present

## 2020-01-05 DIAGNOSIS — F909 Attention-deficit hyperactivity disorder, unspecified type: Secondary | ICD-10-CM

## 2020-01-05 DIAGNOSIS — R198 Other specified symptoms and signs involving the digestive system and abdomen: Secondary | ICD-10-CM | POA: Diagnosis not present

## 2020-01-05 DIAGNOSIS — R102 Pelvic and perineal pain: Secondary | ICD-10-CM | POA: Diagnosis not present

## 2020-01-05 DIAGNOSIS — M6208 Separation of muscle (nontraumatic), other site: Secondary | ICD-10-CM

## 2020-01-05 MED ORDER — AMPHETAMINE-DEXTROAMPHET ER 25 MG PO CP24: 25.0000 mg | ORAL_CAPSULE | Freq: Every morning | ORAL | 0 refills | Status: DC

## 2020-01-05 NOTE — Telephone Encounter (Signed)
Would you call [facility] and cancel ALL rx for adderall for pt [facility]

## 2020-01-05 NOTE — Telephone Encounter (Signed)
Called Amy at [facility]. Cancelled all rx for Adderall.
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
## 2020-01-05 NOTE — Patient Instructions (Signed)
Diastasis Recti  Diastasis recti is when the muscles of the abdomen (rectus abdominis muscles) become thin and separate. The result is a wider space between the right and left abdomen (abdominal) muscles. This wider space between the muscles may cause a bulge in the middle of your abdomen. You may notice this bulge when you are straining or when you sit up from a lying down position. Diastasis recti can affect men and women. It is most common among pregnant women, infants, people who are obese, and people who have had abdominal surgery. Exercise or surgical treatment may help correct it. What are the causes? Common causes of this condition include:  Pregnancy. The growing uterus puts pressure on the abdominal muscles, which causes the muscles to separate.  Obesity. Excess fat puts pressure on abdominal muscles.  Weightlifting.  Some abdomen exercises.  Advanced age.  Genetics.  Prior abdominal surgery. What increases the risk? This condition is more likely to develop in:  Women.  Newborns, especially newborns who are born early (prematurely). What are the signs or symptoms? Common symptoms of this condition include:  A bulge in the middle of the abdomen. You will notice it most when you sit up or strain.  Pain in the low back, pelvis, or hips.  Constipation.  Inability to control when you urinate (urinary incontinence).  Bloating.  Poor posture. How is this diagnosed? This condition is diagnosed with a physical exam. Your health care provider will ask you to lie flat on your back and do a crunch or half sit-up. If you have diastasis recti, a vertical bulge will appear between your abdominal muscles in the center of your abdomen. Your health care provider will measure the gap between your muscles with one of the following:  A medical device used to measure the space between two objects (caliper).  A tape measure.  CT scan.  Ultrasound.  Finger spaces. Your health  care provider will measure the space using their fingers. How is this treated? If your muscle separation is not too large, you may not need treatment. However, if you are a woman who plans to become pregnant again, you should treat this condition before your next pregnancy. Treatment may include:  Physical therapy to strengthen and tighten your abdominal muscles.  Lifestyle changes such as weight loss and exercise.  Over-the-counter pain medicines as needed.  Surgery to correct the separation. Follow these instructions at home: Activity  Return to your normal activities as told by your health care provider. Ask your health care provider what activities are safe for you.  When lifting weights or doing exercises using your abdominal muscles or the muscles in the center of your body that give stability (core muscles), make sure you are doing your exercises and movements correctly. Proper form can help to prevent the condition from happening again. General instructions  If you are overweight, ask your health care provider for help with weight loss. Losing even a small amount of weight can help to improve your diastasis recti.  Take over-the-counter or prescription medicines only as told by your health care provider.  Do not strain. Straining can make the separation worse. Examples of straining include: ? Pushing hard to have a bowel movement, such as due to constipation. ? Lifting heavy objects, including children. ? Standing up and sitting down.  Take steps to prevent constipation: ? Drink enough fluid to keep your urine clear or pale yellow. ? Take over-the-counter or prescription medicines only as directed. ? Eat foods   that are high in fiber, such as fresh fruits and vegetables, whole grains, and beans. ? Limit foods that are high in fat and processed sugars, such as fried and sweet foods. Contact a health care provider if:  You notice a new bulge in your abdomen. Get help right  away if:  You experience severe discomfort in your abdomen.  You develop severe abdominal pain along with nausea, vomiting, or fever. Summary  Diastasis recti is when the abdomen (abdominal) muscles become thin and separate. Your abdomen will stick out because the space between your right and left abdomen muscles has widened.  The most common symptom is a bulge in your abdomen. You will notice it most when you sit up or are straining.  This condition is diagnosed during a physical exam.  If the abdomen separation is not too big, you may choose not to have treatment. Otherwise, you may need to undergo physical therapy or surgery. This information is not intended to replace advice given to you by your health care provider. Make sure you discuss any questions you have with your health care provider. Document Revised: 05/16/2017 Document Reviewed: 07/29/2016 Elsevier Patient Education  2020 Elsevier Inc.  

## 2020-01-06 ENCOUNTER — Ambulatory Visit (INDEPENDENT_AMBULATORY_CARE_PROVIDER_SITE_OTHER): Payer: Commercial Managed Care - PPO | Admitting: Psychology

## 2020-01-06 DIAGNOSIS — F909 Attention-deficit hyperactivity disorder, unspecified type: Secondary | ICD-10-CM | POA: Diagnosis not present

## 2020-01-11 ENCOUNTER — Other Ambulatory Visit: Payer: Self-pay

## 2020-01-11 ENCOUNTER — Ambulatory Visit (INDEPENDENT_AMBULATORY_CARE_PROVIDER_SITE_OTHER): Payer: Commercial Managed Care - PPO

## 2020-01-11 DIAGNOSIS — R102 Pelvic and perineal pain: Secondary | ICD-10-CM | POA: Diagnosis not present

## 2020-01-20 ENCOUNTER — Telehealth: Payer: Self-pay | Admitting: Family

## 2020-01-20 MED ORDER — TRAZODONE HCL 100 MG PO TABS: 100.0000 mg | ORAL_TABLET | Freq: Every day | ORAL | 1 refills | Status: DC

## 2020-01-20 NOTE — Telephone Encounter (Signed)
Pt needs [facility]. She said she recently switched pharmacies and need new prescription. She is also currently out of medication.

## 2020-01-21 ENCOUNTER — Encounter: Payer: Self-pay | Admitting: Family

## 2020-01-21 ENCOUNTER — Other Ambulatory Visit: Payer: Self-pay | Admitting: Family

## 2020-01-21 DIAGNOSIS — M545 Low back pain, unspecified: Secondary | ICD-10-CM

## 2020-02-07 ENCOUNTER — Telehealth: Payer: Self-pay | Admitting: Family

## 2020-02-07 DIAGNOSIS — M79673 Pain in unspecified foot: Secondary | ICD-10-CM

## 2020-02-07 NOTE — Telephone Encounter (Signed)
Can referral be placed?  °

## 2020-02-07 NOTE — Telephone Encounter (Signed)
Patient called & informed that podiatry referral was sent. She was also given number to call them to see if she could go ahead & make an appointment.

## 2020-02-07 NOTE — Telephone Encounter (Signed)
Patient would like a referral to a foot doctor. both of her big toes nails have falling off and now growing into her toes. She needs an appointment fast.

## 2020-02-07 NOTE — Telephone Encounter (Signed)
Call pt Ref placed podiatry She may go ahead and call [facility] an appt  [place] [address] [place], [place] [address]  Phone: [phone] Fax: [phone]

## 2020-02-08 ENCOUNTER — Encounter: Payer: Self-pay | Admitting: Podiatry

## 2020-02-08 ENCOUNTER — Ambulatory Visit: Payer: Commercial Managed Care - PPO | Admitting: Podiatry

## 2020-02-08 ENCOUNTER — Other Ambulatory Visit: Payer: Self-pay

## 2020-02-08 DIAGNOSIS — L600 Ingrowing nail: Secondary | ICD-10-CM

## 2020-02-08 NOTE — Patient Instructions (Signed)

## 2020-02-09 ENCOUNTER — Encounter: Payer: Self-pay | Admitting: Podiatry
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
## 2020-02-13 ENCOUNTER — Encounter: Payer: Self-pay | Admitting: Family

## 2020-02-14 ENCOUNTER — Telehealth: Payer: Self-pay | Admitting: Family

## 2020-02-14 DIAGNOSIS — F909 Attention-deficit hyperactivity disorder, unspecified type: Secondary | ICD-10-CM

## 2020-02-14 NOTE — Telephone Encounter (Signed)
Pt needs a refill on amphetamine-dextroamphetamine (ADDERALL XR) 25 MG 24 hr [facility]

## 2020-02-15 ENCOUNTER — Other Ambulatory Visit: Payer: Self-pay

## 2020-02-15 MED ORDER — TRAZODONE HCL 100 MG PO TABS: ORAL_TABLET | ORAL | 1 refills | Status: DC

## 2020-02-15 NOTE — Telephone Encounter (Signed)
Call pt I called our pharmacist Catie who felt if she has been on trazodone for that length of time , its okay for her continue . We cannot however add any further serontonin agents to risk of toxicity.  You may refill trazodone 200mg  po qhs. If she would like to keep Friday appt we can or she can move out .

## 2020-02-15 NOTE — Telephone Encounter (Signed)
FYI I schedule patient a virtual on Friday. She was very upset bc she has been on the 200mg  trazodone for years now along with the lexapro. She said that she can't sleep on 100mg  & she was getting migraines from no sleep. Initially neurology prescribed the 200mg  dose then she stated that Lauren continued to refill it. 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 She said that she is willing to take something else for sleep, so I told her this would have to be discussed with you.

## 2020-02-15 NOTE — Telephone Encounter (Signed)
LMTCB

## 2020-02-15 NOTE — Telephone Encounter (Signed)
Call pt I got her message about taking 200mg  trazdone qhs. Im concerned that being on lexapro 20mg  that she is at risk for too much serotonin. I do not feel comfortable being on that high a dose trazodone with the lexapro Please sch an appt to discuss this and advise that she only takes trazodone 100mg  qhs.  adderall appears to have refill for 02/05/20. Has she called her pharmacy?  she needs to have 3  Month appts with me; remind her of this importance and to schedule IN advance and my schedule books up  Advise to request adderall through mychart OR via pharmacy as this delays request  I looked up patient on [place] Controlled Substances Reporting System PMP AWARE and saw no activity that raised concern of inappropriate use.

## 2020-02-16 NOTE — Telephone Encounter (Signed)
Noted appt 10/8

## 2020-02-16 NOTE — Telephone Encounter (Signed)
I called and advised patient that I had sent in trazodone 100mg  to take two tablets at bedtime. Patient also wanted me to let you know that after meeting with Behavioral Health that she was also going to be tested for autism not just ADHD. She has two upcoming appointments with them in October.

## 2020-02-18 ENCOUNTER — Telehealth: Payer: Commercial Managed Care - PPO | Admitting: Family

## 2020-03-08 ENCOUNTER — Other Ambulatory Visit: Payer: Self-pay | Admitting: Family

## 2020-03-08 DIAGNOSIS — F419 Anxiety disorder, unspecified: Secondary | ICD-10-CM

## 2020-03-10 ENCOUNTER — Telehealth: Payer: Self-pay | Admitting: Family

## 2020-03-10 DIAGNOSIS — F419 Anxiety disorder, unspecified: Secondary | ICD-10-CM

## 2020-03-10 MED ORDER — BUSPIRONE HCL 10 MG PO TABS: 10.0000 mg | ORAL_TABLET | Freq: Three times a day (TID) | ORAL | 3 refills | Status: DC

## 2020-03-10 MED ORDER — TRAZODONE HCL 100 MG PO TABS: ORAL_TABLET | ORAL | 1 refills | Status: DC

## 2020-03-10 NOTE — Telephone Encounter (Signed)
Pt needs a refill on traZODone (DESYREL) 100 MG tablet and busPIRone (BUSPAR) 10 [facility]  Pt is out of medication

## 2020-03-10 NOTE — Addendum Note (Signed)
Addended by: Elise Benne T on: 03/10/2020 12:58 PM   Modules accepted: Orders

## 2020-03-10 NOTE — Addendum Note (Signed)
Addended byElise Benne T on: 03/10/2020 12:57 PM   Modules accepted: Orders

## 2020-03-20 ENCOUNTER — Encounter: Payer: Self-pay | Admitting: Family

## 2020-03-22 ENCOUNTER — Ambulatory Visit: Payer: Commercial Managed Care - PPO | Admitting: Psychology

## 2020-03-22 ENCOUNTER — Encounter: Payer: Self-pay | Admitting: Family

## 2020-03-24 ENCOUNTER — Other Ambulatory Visit: Payer: Self-pay | Admitting: Family

## 2020-03-24 ENCOUNTER — Telehealth: Payer: Self-pay | Admitting: Family

## 2020-03-24 ENCOUNTER — Encounter: Payer: Self-pay | Admitting: Family

## 2020-03-24 ENCOUNTER — Other Ambulatory Visit: Payer: Self-pay

## 2020-03-24 ENCOUNTER — Telehealth (INDEPENDENT_AMBULATORY_CARE_PROVIDER_SITE_OTHER): Payer: Commercial Managed Care - PPO | Admitting: Family

## 2020-03-24 DIAGNOSIS — F909 Attention-deficit hyperactivity disorder, unspecified type: Secondary | ICD-10-CM

## 2020-03-24 DIAGNOSIS — G43801 Other migraine, not intractable, with status migrainosus: Secondary | ICD-10-CM

## 2020-03-24 DIAGNOSIS — F419 Anxiety disorder, unspecified: Secondary | ICD-10-CM | POA: Diagnosis not present

## 2020-03-24 DIAGNOSIS — G4700 Insomnia, unspecified: Secondary | ICD-10-CM

## 2020-03-24 MED ORDER — AMPHETAMINE-DEXTROAMPHET ER 25 MG PO CP24: 25.0000 mg | ORAL_CAPSULE | ORAL | 0 refills | Status: DC

## 2020-03-24 MED ORDER — AMPHETAMINE-DEXTROAMPHET ER 25 MG PO CP24: 25.0000 mg | ORAL_CAPSULE | Freq: Every morning | ORAL | 0 refills | Status: DC

## 2020-03-24 MED ORDER — BUSPIRONE HCL 10 MG PO TABS: 15.0000 mg | ORAL_TABLET | Freq: Two times a day (BID) | ORAL | 3 refills | Status: DC

## 2020-03-24 NOTE — Telephone Encounter (Signed)
Call pt Last visit 10/27/19 Appt today Printed rx as couldn't e send to pharmacy  I looked up patient on [place] Controlled Substances Reporting System PMP AWARE and saw no activity that raised concern of inappropriate use.

## 2020-03-24 NOTE — Assessment & Plan Note (Addendum)
Stable, continue propranolol 80mg  qd.

## 2020-03-24 NOTE — Assessment & Plan Note (Signed)
Chronic, suboptimal control. I advised to either consider counseling, referral back to neurology. Maximum dose trazodone 200mg  qhs.

## 2020-03-24 NOTE — Progress Notes (Signed)
Virtual Visit via Video Note  I connected with@  on 10/8//21 at  4:00 PM EDT by a video enabled telemedicine application and verified that I am speaking with the correct person using two identifiers.  Location patient: home Location provider:work  Persons participating in the virtual visit: patient, provider  I discussed the limitations of evaluation and management by telemedicine and the availability of in person appointments. The patient expressed understanding and agreed to proceed.   HPI: Medication follow up  ADHD-  Feels well on adderall 25mg . She doesn't feel that adderal increases anxiety, insomnia on adderall.     Behavioral health appt 03/28/20 for ADHD testing  Anxiety and depression- Increase in anxiety of late. takes buspar more often BID, taking TID is hard to remember as she watching children. Feels less irritable when taking buspar. Compliant with lexapro 20mg , trazodone 200mg . Trouble staying asleep for many years. Trazodone somewhat helpful.   Migraine- well controlled and improved on propranolol.  Thyroidectomy 01/28/20. Compliant with levothyroxine .   Just started gabapentin 4 weeks ago by orthopedic for low back pain. Doesn't feel tired on regimen.    ROS: See pertinent positives and negatives per HPI.    EXAM:  VITALS per patient if applicable: BP 123/77   Ht 5\' 2"  (1.575 m)   Wt 169 lb 12.8 oz (77 kg)   BMI 31.06 kg/m  BP Readings from Last 3 Encounters:  03/24/20 123/77  01/05/20 107/71  11/18/19 114/74   Wt Readings from Last 3 Encounters:  03/24/20 169 lb 12.8 oz (77 kg)  01/05/20 167 lb 8 oz (76 kg)  11/19/19 163 lb (73.9 kg)    GENERAL: alert, oriented, appears well and in no acute distress  HEENT: atraumatic, conjunttiva clear, no obvious abnormalities on inspection of external nose and ears  NECK: normal movements of the head and neck  LUNGS: on inspection no signs of respiratory distress, breathing rate appears normal, no  obvious gross SOB, gasping or wheezing  CV: no obvious cyanosis  MS: moves all visible extremities without noticeable abnormality  PSYCH/NEURO: pleasant and cooperative, no obvious depression or anxiety, speech and thought processing grossly intact  ASSESSMENT AND PLAN:  Discussed the following assessment and plan:  Problem List Items Addressed This Visit      Cardiovascular and Mediastinum   Migraine    Stable, continue propranolol 80mg  qd.       Relevant Medications   gabapentin (NEURONTIN) 300 MG capsule   meloxicam (MOBIC) 15 MG tablet     Other   ADHD    Overall concentration improved on adderall 25mg , however long discussion regarding stimulant and sedating medications which patient is she on and my concern with using medications to treat side effects of other medications. Advised to decrease adderall to see if quality of sleep and anxiety improves. She is adamant that adderall is not exacerbating and insomnia, anxiety are long standing issues. She feels she really needs adderall for QOL. We will continue for now. Upcoming appointment with behavioral health next week.       Anxiety and depression    Stable, however not optimal. Shared my concern with serotonin syndrome as she is on lexapro 20mg  and trazodone 200mg  qhs. As she has been on this regimen for a quite some time and patient is adamant that she doesn't want to decrease medication, we will continue with vigilance of serotonin syndrome signs including rapid heart rate, sweating , diarrhea, headache. For now we will change dosing of buspar  from 7.5mg  TID to 15mg  BID and I have advised if any further changes, we will need to consult psychiatry. I am not comfortable with increasing medication doses beyond doses, namely buspar or trazodone, as they are today. Will continue to discuss at follow up.       Relevant Medications   busPIRone (BUSPAR) 10 MG tablet   Insomnia    Chronic, suboptimal control. I advised to either  consider counseling, referral back to neurology. Maximum dose trazodone 200mg  qhs.          -we discussed possible serious and likely etiologies, options for evaluation and workup, limitations of telemedicine visit vs in person visit, treatment, treatment risks and precautions. Pt prefers to treat via telemedicine empirically rather then risking or undertaking an in person visit at this moment.  .   I discussed the assessment and treatment plan with the patient. The patient was provided an opportunity to ask questions and all were answered. The patient agreed with the plan and demonstrated an understanding of the instructions.   The patient was advised to call back or seek an in-person evaluation if the symptoms worsen or if the condition fails to improve as anticipated.   , FNP

## 2020-03-27 NOTE — Patient Instructions (Signed)
Change buspar to 15mg  taken twice daily. You will not take any 7.5mg  doses of this medication

## 2020-03-27 NOTE — Assessment & Plan Note (Addendum)
Stable, however not optimal. Shared my concern with serotonin syndrome as she is on lexapro 20mg  and trazodone 200mg  qhs. As she has been on this regimen for a quite some time and patient is adamant that she doesn't want to decrease medication, we will continue with vigilance of serotonin syndrome signs including rapid heart rate, sweating , diarrhea, headache. For now we will change dosing of buspar from 7.5mg  TID to 15mg  BID and I have advised if any further changes, we will need to consult psychiatry. I am not comfortable with increasing medication doses beyond doses, namely buspar or trazodone, as they are today. Will continue to discuss at follow up.

## 2020-03-27 NOTE — Assessment & Plan Note (Signed)
Overall concentration improved on adderall 25mg , however long discussion regarding stimulant and sedating medications which patient is she on and my concern with using medications to treat side effects of other medications. Advised to decrease adderall to see if quality of sleep and anxiety improves. She is adamant that adderall is not exacerbating and insomnia, anxiety are long standing issues. She feels she really needs adderall for QOL. We will continue for now. Upcoming appointment with behavioral health next week.

## 2020-03-28 ENCOUNTER — Ambulatory Visit: Payer: Commercial Managed Care - PPO | Admitting: Psychology

## 2020-03-30 DIAGNOSIS — F840 Autistic disorder: Secondary | ICD-10-CM

## 2020-03-30 DIAGNOSIS — F900 Attention-deficit hyperactivity disorder, predominantly inattentive type: Secondary | ICD-10-CM

## 2020-03-30 DIAGNOSIS — F439 Reaction to severe stress, unspecified: Secondary | ICD-10-CM

## 2020-03-30 DIAGNOSIS — F411 Generalized anxiety disorder: Secondary | ICD-10-CM

## 2020-04-06 ENCOUNTER — Other Ambulatory Visit: Payer: Self-pay | Admitting: Family

## 2020-04-06 DIAGNOSIS — F419 Anxiety disorder, unspecified: Secondary | ICD-10-CM

## 2020-04-13 ENCOUNTER — Encounter: Payer: Self-pay | Admitting: Family

## 2020-04-13 DIAGNOSIS — F840 Autistic disorder: Secondary | ICD-10-CM | POA: Insufficient documentation

## 2020-04-18 ENCOUNTER — Other Ambulatory Visit: Payer: Self-pay | Admitting: Family

## 2020-04-18 DIAGNOSIS — S39012A Strain of muscle, fascia and tendon of lower back, initial encounter: Secondary | ICD-10-CM

## 2020-04-27 ENCOUNTER — Encounter: Payer: Self-pay | Admitting: Family

## 2020-04-27 ENCOUNTER — Other Ambulatory Visit: Payer: Self-pay | Admitting: Family

## 2020-04-27 DIAGNOSIS — F419 Anxiety disorder, unspecified: Secondary | ICD-10-CM

## 2020-05-20 ENCOUNTER — Other Ambulatory Visit: Payer: Self-pay | Admitting: Family

## 2020-05-22 ENCOUNTER — Other Ambulatory Visit: Payer: Self-pay | Admitting: Family

## 2020-05-22 ENCOUNTER — Encounter: Payer: Self-pay | Admitting: Family

## 2020-05-22 DIAGNOSIS — F909 Attention-deficit hyperactivity disorder, unspecified type: Secondary | ICD-10-CM

## 2020-05-27 ENCOUNTER — Other Ambulatory Visit: Payer: Self-pay | Admitting: Family

## 2020-05-27 DIAGNOSIS — S39012A Strain of muscle, fascia and tendon of lower back, initial encounter: Secondary | ICD-10-CM

## 2020-06-27 ENCOUNTER — Other Ambulatory Visit: Payer: Self-pay | Admitting: Family

## 2020-06-27 DIAGNOSIS — F909 Attention-deficit hyperactivity disorder, unspecified type: Secondary | ICD-10-CM

## 2020-06-27 NOTE — Telephone Encounter (Signed)
Refill request for Adderall 25 mg caps.   LOV - 03/24/20 Next OV- 10/27/20 Last refilled - 03/24/20

## 2020-06-28 ENCOUNTER — Other Ambulatory Visit: Payer: Self-pay | Admitting: Family

## 2020-06-28 DIAGNOSIS — F909 Attention-deficit hyperactivity disorder, unspecified type: Secondary | ICD-10-CM

## 2020-06-28 MED ORDER — AMPHETAMINE-DEXTROAMPHET ER 25 MG PO CP24: 25.0000 mg | ORAL_CAPSULE | Freq: Every morning | ORAL | 0 refills | Status: DC

## 2020-06-28 MED ORDER — AMPHETAMINE-DEXTROAMPHET ER 25 MG PO CP24: 25.0000 mg | ORAL_CAPSULE | ORAL | 0 refills | Status: DC

## 2020-06-29 IMAGING — US US THYROID
1 series · 14 of 25 positions shown · non-contrast
Comparison: None.

CLINICAL DATA: Palpable abnormality.  Thyromegaly on physical exam.

EXAM:
THYROID ULTRASOUND
TECHNIQUE: Ultrasound examination of the thyroid gland and adjacent soft
tissues was performed.

[Series 1: us thyroid · 0.07mm/px · 14 of 42 slices shown]
[im 1/42]
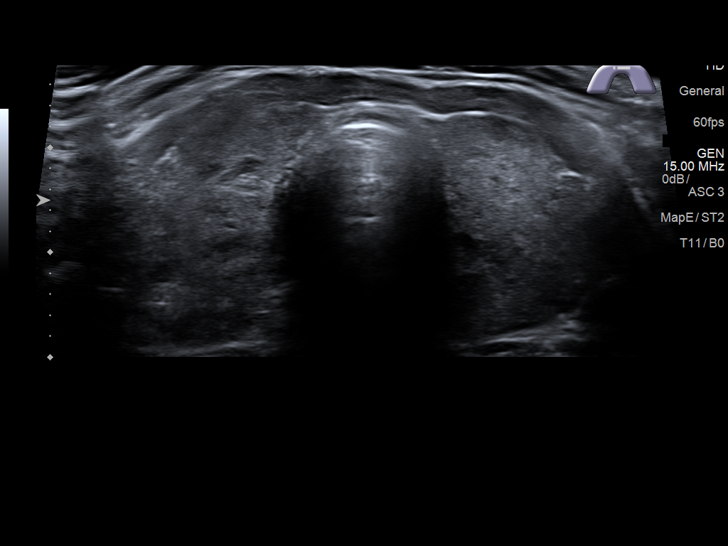
[im 4/42]
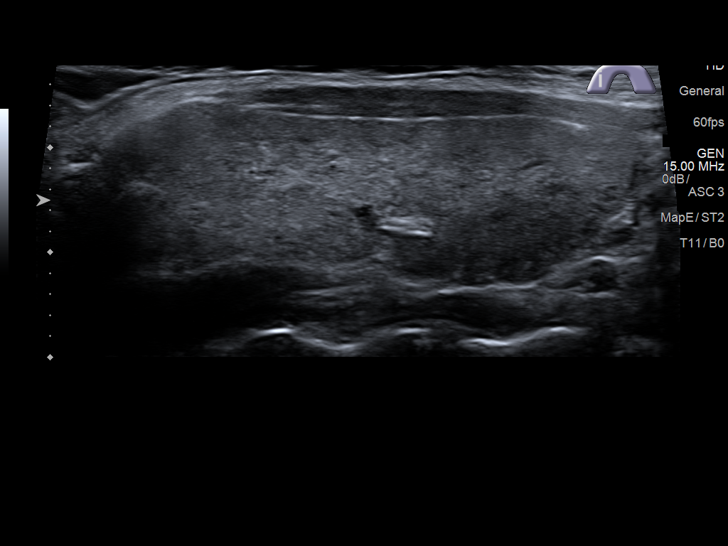
[im 7/42]
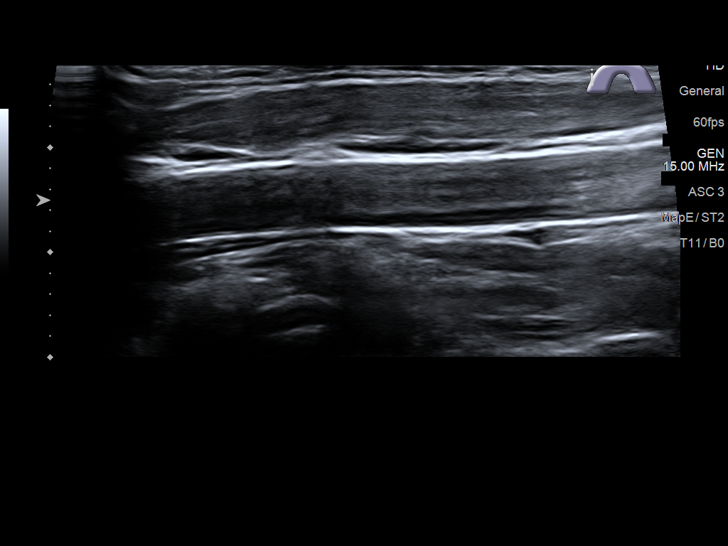
[im 11/42]
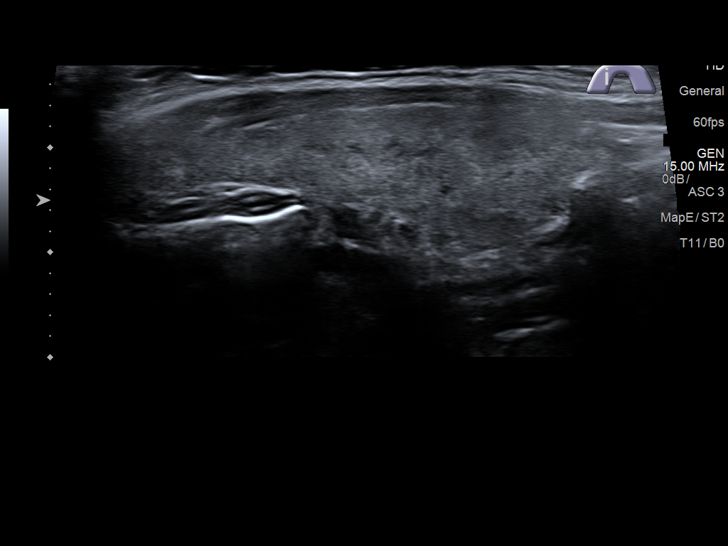
[im 14/42]
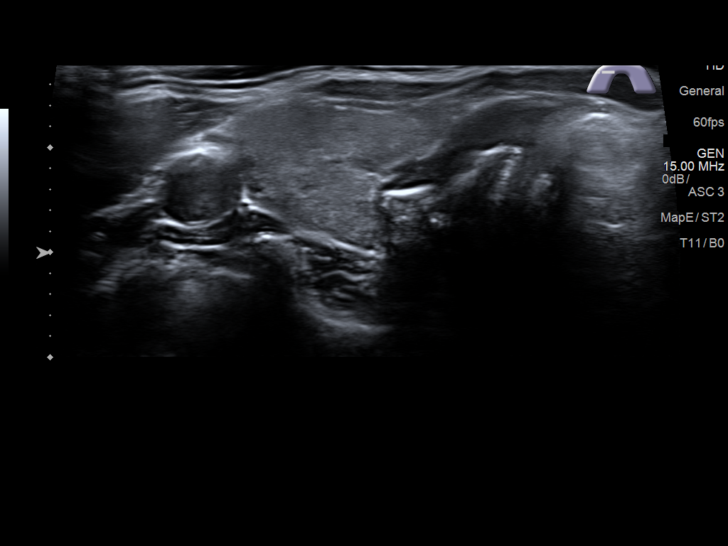
[im 16/42]
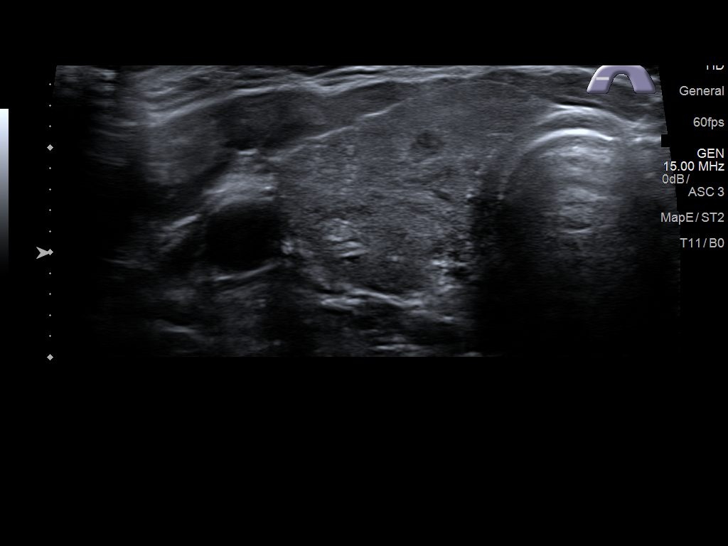
[im 19/42]
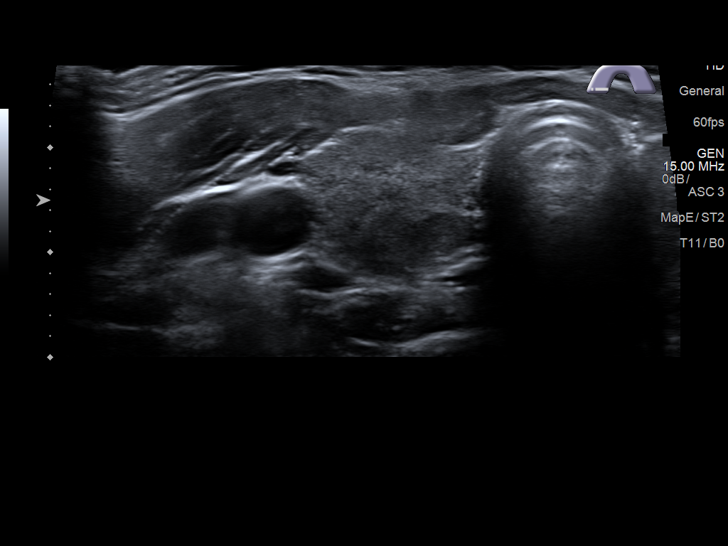
[im 23/42]
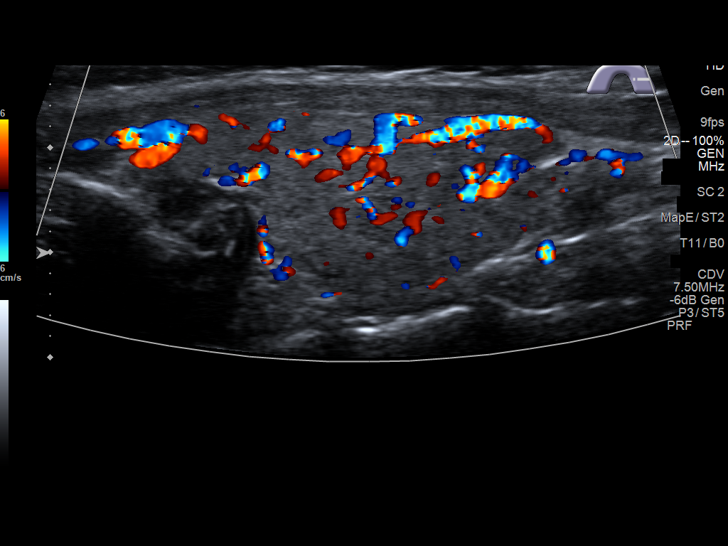
[im 26/42]
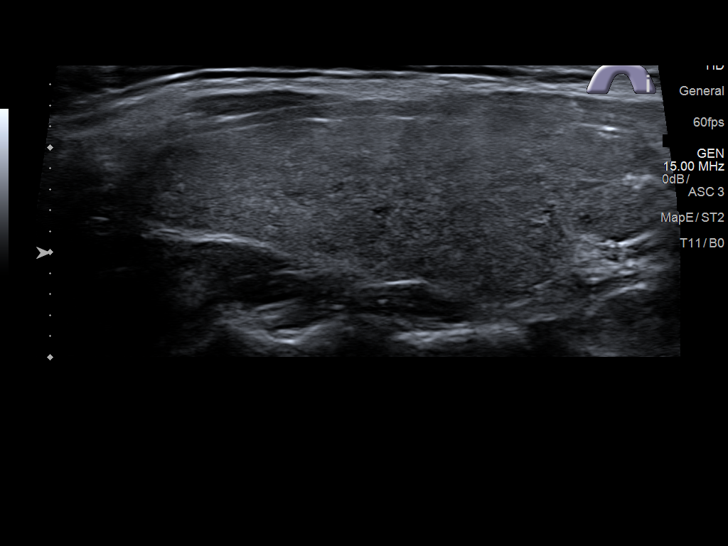
[im 28/42]
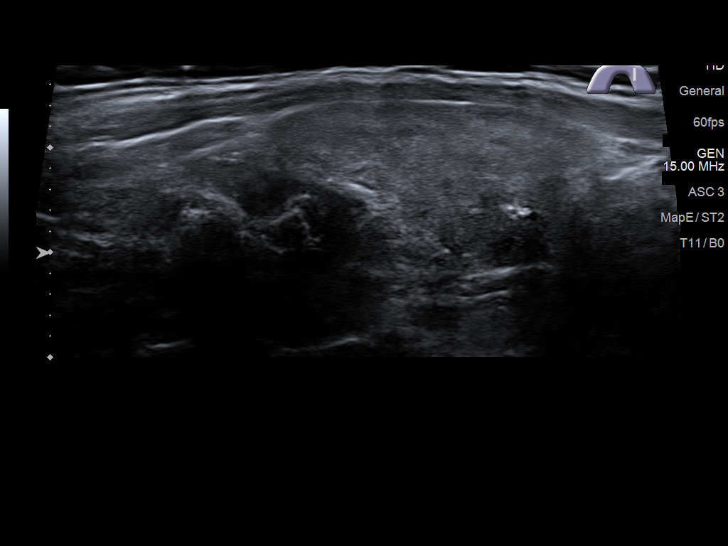
[im 31/42]
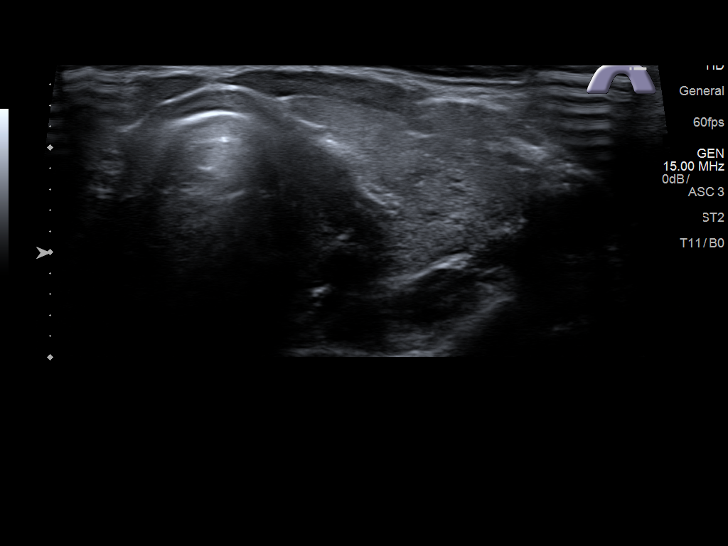
[im 35/42]
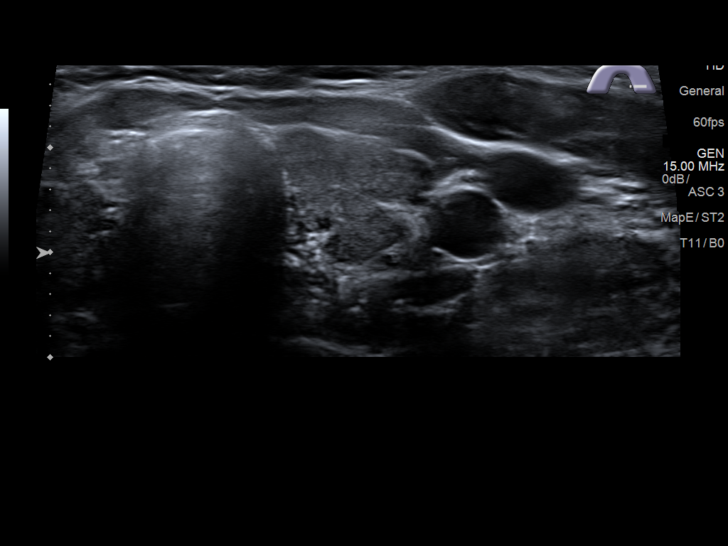
[im 38/42]
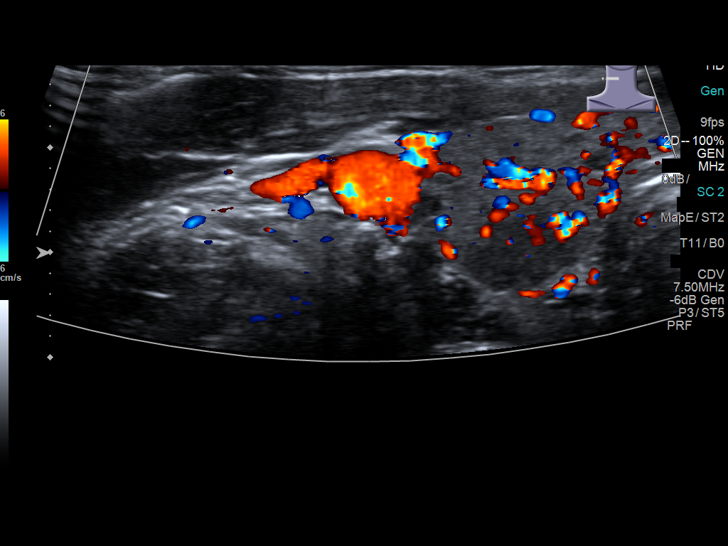
[im 42/42]
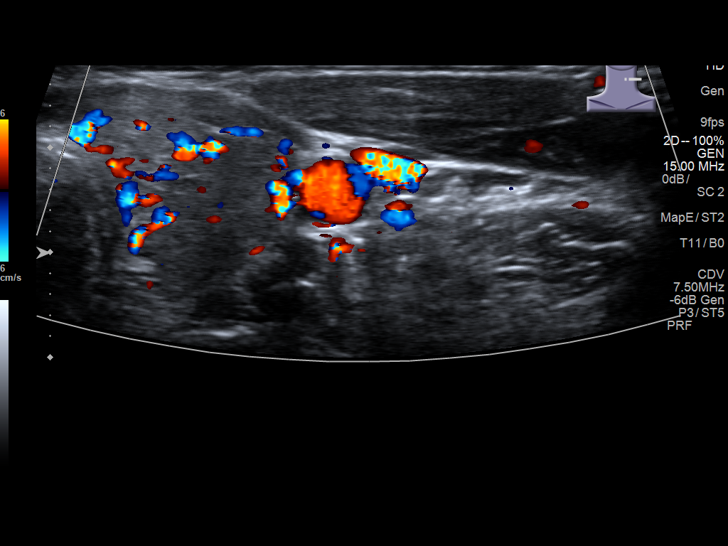

[14 of 25 positions shown; findings below may reference images not displayed]

FINDINGS: Parenchymal Echotexture: Moderately heterogenous

Isthmus: 0.1 cm

Right lobe: 5 x 1.6 x 2.1 cm

Left lobe: 4.5 x 1.9 x 1.8 cm

_________________________________________________________

Estimated total number of nodules >/= 1 cm: 0

Number of spongiform nodules >/=  2 cm not described below (TR1): 0

Number of mixed cystic and solid nodules >/= 1.5 cm not described
below (TR2): 0

_________________________________________________________

No discrete nodules are seen within the thyroid gland.
IMPRESSION: Mildly heterogeneous and enlarged thyroid gland without evidence of
discrete thyroid nodule.

## 2020-07-17 ENCOUNTER — Telehealth: Payer: Self-pay | Admitting: Family

## 2020-07-17 NOTE — Telephone Encounter (Signed)
close

## 2020-08-01 ENCOUNTER — Telehealth: Payer: Self-pay | Admitting: Family

## 2020-08-01 ENCOUNTER — Encounter: Payer: Self-pay | Admitting: Family

## 2020-08-01 ENCOUNTER — Telehealth (INDEPENDENT_AMBULATORY_CARE_PROVIDER_SITE_OTHER): Payer: Commercial Managed Care - PPO | Admitting: Family

## 2020-08-01 VITALS — Ht 62.01 in | Wt 167.0 lb

## 2020-08-01 DIAGNOSIS — F419 Anxiety disorder, unspecified: Secondary | ICD-10-CM | POA: Diagnosis not present

## 2020-08-01 DIAGNOSIS — F32A Depression, unspecified: Secondary | ICD-10-CM | POA: Diagnosis not present

## 2020-08-01 DIAGNOSIS — F909 Attention-deficit hyperactivity disorder, unspecified type: Secondary | ICD-10-CM | POA: Diagnosis not present

## 2020-08-01 MED ORDER — HYDROXYZINE HCL 10 MG PO TABS: 10.0000 mg | ORAL_TABLET | Freq: Every evening | ORAL | 1 refills | Status: DC | PRN

## 2020-08-01 MED ORDER — BUSPIRONE HCL 10 MG PO TABS: ORAL_TABLET | ORAL | 2 refills | Status: DC

## 2020-08-01 NOTE — Progress Notes (Signed)
Virtual Visit via Video Note  I connected with@  on 08/01/20 at 11:30 AM EST by a video enabled telemedicine application and verified that I am speaking with the correct person using two identifiers.  Location patient: home Location provider:work  Persons participating in the virtual visit: patient, provider  I discussed the limitations of evaluation and management by telemedicine and the availability of in person appointments. The patient expressed understanding and agreed to proceed.   HPI:  Follow up anxiety  She self decreased lexapro to 10mg  and anxiety increased when she went off medication completely. She started to have anxiety attacks.  Deceased trazodone from 200mg  to 50mg .  She sees 'a positive' in being on such high doses of trazodone and lexapro. She realize that she had been so sleepy on trazodone.   Compliant buspar 15mg  BID and feels that at night she has more anxiety at night. She had an old prescription of atarax 10mg  which has been helpful for sleep, anxiety.   ADHD- compliant with adderall 25mg  with she continues to find helpful for concentration.    ROS: See pertinent positives and negatives per HPI.    EXAM:  VITALS per patient if applicable: Ht 5' 2.01" (1.575 m)   Wt 167 lb (75.8 kg)   BMI 30.54 kg/m  BP Readings from Last 3 Encounters:  03/24/20 123/77  01/05/20 107/71  11/18/19 114/74   Wt Readings from Last 3 Encounters:  08/01/20 167 lb (75.8 kg)  03/24/20 169 lb 12.8 oz (77 kg)  01/05/20 167 lb 8 oz (76 kg)    GENERAL: alert, oriented, appears well and in no acute distress  HEENT: atraumatic, conjunttiva clear, no obvious abnormalities on inspection of external nose and ears  NECK: normal movements of the head and neck  LUNGS: on inspection no signs of respiratory distress, breathing rate appears normal, no obvious gross SOB, gasping or wheezing  CV: no obvious cyanosis  MS: moves all visible extremities without noticeable  abnormality  PSYCH/NEURO: pleasant and cooperative, no obvious depression or anxiety, speech and thought processing grossly intact  ASSESSMENT AND PLAN:  Discussed the following assessment and plan:  Problem List Items Addressed This Visit      Other   ADHD    Controlled. Continue adderall 25mg .       Anxiety and depression - Primary    Controlled. We agreed that dose reduction in lexapro and trazodone. Continue lexapro 10mg  and trazodone 50mg . She will continue atarax 10mg  with trazodone qhs. Declines seeing counselor.  Increase to buspar 15mg  BID with additional 5-10mg  early evening if needed.       Relevant Medications   hydrOXYzine (ATARAX/VISTARIL) 10 MG tablet   busPIRone (BUSPAR) 10 MG tablet    Other Visit Diagnoses    Anxiety       Relevant Medications   hydrOXYzine (ATARAX/VISTARIL) 10 MG tablet   busPIRone (BUSPAR) 10 MG tablet      -we discussed possible serious and likely etiologies, options for evaluation and workup, limitations of telemedicine visit vs in person visit, treatment, treatment risks and precautions. Pt prefers to treat via telemedicine empirically rather then risking or undertaking an in person visit at this moment.  .   I discussed the assessment and treatment plan with the patient. The patient was provided an opportunity to ask questions and all were answered. The patient agreed with the plan and demonstrated an understanding of the instructions.   The patient was advised to call back or seek an in-person  evaluation if the symptoms worsen or if the condition fails to improve as anticipated.   Rennie Plowman, FNP

## 2020-08-01 NOTE — Telephone Encounter (Signed)
Lm on vm to call office to schedule a 3m follow up with Arnett. 

## 2020-08-01 NOTE — Assessment & Plan Note (Addendum)
Controlled. We agreed that dose reduction in lexapro and trazodone. Continue lexapro 10mg  and trazodone 50mg . She will continue atarax 10mg  with trazodone qhs. Declines seeing counselor.  Increase to buspar 15mg  BID with additional 5-10mg  early evening if needed.

## 2020-08-01 NOTE — Assessment & Plan Note (Signed)
Controlled. Continue adderall 25mg .

## 2020-08-02 ENCOUNTER — Other Ambulatory Visit: Payer: Self-pay | Admitting: Family

## 2020-08-02 ENCOUNTER — Telehealth: Payer: Self-pay

## 2020-08-02 DIAGNOSIS — F419 Anxiety disorder, unspecified: Secondary | ICD-10-CM

## 2020-08-02 DIAGNOSIS — G43709 Chronic migraine without aura, not intractable, without status migrainosus: Secondary | ICD-10-CM

## 2020-08-02 DIAGNOSIS — F32A Depression, unspecified: Secondary | ICD-10-CM

## 2020-08-02 DIAGNOSIS — S39012A Strain of muscle, fascia and tendon of lower back, initial encounter: Secondary | ICD-10-CM

## 2020-08-02 MED ORDER — HYDROXYZINE HCL 10 MG PO TABS: 10.0000 mg | ORAL_TABLET | Freq: Every evening | ORAL | 1 refills | Status: DC | PRN

## 2020-08-02 NOTE — Telephone Encounter (Signed)
Please send hydrOXYzine (ATARAX/VISTARIL) 10 MG tablet to [facility] IN [place]. It was sent to a mail in [place] and pt needs to pick it up

## 2020-08-12 ENCOUNTER — Other Ambulatory Visit: Payer: Self-pay | Admitting: Family

## 2020-08-31 ENCOUNTER — Encounter: Payer: Self-pay | Admitting: Family

## 2020-08-31 ENCOUNTER — Other Ambulatory Visit: Payer: Self-pay | Admitting: Family

## 2020-09-04 ENCOUNTER — Encounter: Payer: Self-pay | Admitting: Family

## 2020-09-06 ENCOUNTER — Telehealth: Payer: Self-pay

## 2020-09-06 NOTE — Telephone Encounter (Signed)
Fax received that Adderall XR covered 09/01/20-09/01/21. Pt aware of this.

## 2020-09-11 ENCOUNTER — Other Ambulatory Visit: Payer: Self-pay | Admitting: Family

## 2020-09-11 MED ORDER — LINACLOTIDE 290 MCG PO CAPS: 290.0000 ug | ORAL_CAPSULE | ORAL | 1 refills | Status: DC | PRN

## 2020-09-12 ENCOUNTER — Other Ambulatory Visit: Payer: Self-pay

## 2020-09-14 ENCOUNTER — Other Ambulatory Visit: Payer: Self-pay

## 2020-09-14 ENCOUNTER — Ambulatory Visit: Payer: Commercial Managed Care - PPO | Admitting: Podiatry

## 2020-09-14 ENCOUNTER — Encounter: Payer: Self-pay | Admitting: Podiatry

## 2020-09-14 DIAGNOSIS — L600 Ingrowing nail: Secondary | ICD-10-CM | POA: Diagnosis not present
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
## 2020-10-02 ENCOUNTER — Other Ambulatory Visit: Payer: Self-pay | Admitting: Family

## 2020-10-02 ENCOUNTER — Other Ambulatory Visit: Payer: Self-pay

## 2020-10-02 DIAGNOSIS — F909 Attention-deficit hyperactivity disorder, unspecified type: Secondary | ICD-10-CM

## 2020-10-02 DIAGNOSIS — S39012A Strain of muscle, fascia and tendon of lower back, initial encounter: Secondary | ICD-10-CM

## 2020-10-02 MED ORDER — LINACLOTIDE 290 MCG PO CAPS: 290.0000 ug | ORAL_CAPSULE | ORAL | 1 refills | Status: DC | PRN

## 2020-10-02 NOTE — Telephone Encounter (Signed)
RX Refill:Adderrall Last Seen:08-01-20 Last ordered:06-28-20

## 2020-10-03 ENCOUNTER — Other Ambulatory Visit: Payer: Self-pay | Admitting: Family

## 2020-10-03 DIAGNOSIS — F909 Attention-deficit hyperactivity disorder, unspecified type: Secondary | ICD-10-CM

## 2020-10-03 MED ORDER — AMPHETAMINE-DEXTROAMPHET ER 25 MG PO CP24: 25.0000 mg | ORAL_CAPSULE | ORAL | 0 refills | Status: DC

## 2020-10-03 MED ORDER — AMPHETAMINE-DEXTROAMPHET ER 25 MG PO CP24: 25.0000 mg | ORAL_CAPSULE | Freq: Every morning | ORAL | 0 refills | Status: DC

## 2020-10-27 ENCOUNTER — Other Ambulatory Visit: Payer: Self-pay

## 2020-10-27 ENCOUNTER — Encounter: Payer: Self-pay | Admitting: Family

## 2020-10-27 ENCOUNTER — Ambulatory Visit (INDEPENDENT_AMBULATORY_CARE_PROVIDER_SITE_OTHER): Payer: Commercial Managed Care - PPO | Admitting: Family

## 2020-10-27 VITALS — BP 92/60 | HR 88 | Temp 98.1°F | Ht 62.2 in | Wt 165.2 lb

## 2020-10-27 DIAGNOSIS — F32A Depression, unspecified: Secondary | ICD-10-CM | POA: Diagnosis not present

## 2020-10-27 DIAGNOSIS — M549 Dorsalgia, unspecified: Secondary | ICD-10-CM | POA: Diagnosis not present

## 2020-10-27 DIAGNOSIS — R140 Abdominal distension (gaseous): Secondary | ICD-10-CM | POA: Diagnosis not present

## 2020-10-27 DIAGNOSIS — F419 Anxiety disorder, unspecified: Secondary | ICD-10-CM | POA: Diagnosis not present

## 2020-10-27 DIAGNOSIS — Z0000 Encounter for general adult medical examination without abnormal findings: Secondary | ICD-10-CM

## 2020-10-27 LAB — CBC WITH DIFFERENTIAL/PLATELET
Basophils Absolute: 0 10*3/uL (ref 0.0–0.1)
Basophils Relative: 0.7 % (ref 0.0–3.0)
Eosinophils Absolute: 0.1 10*3/uL (ref 0.0–0.7)
Eosinophils Relative: 2.3 % (ref 0.0–5.0)
HCT: 42.8 % (ref 36.0–46.0)
Hemoglobin: 14.5 g/dL (ref 12.0–15.0)
Lymphocytes Relative: 25 % (ref 12.0–46.0)
Lymphs Abs: 1.3 10*3/uL (ref 0.7–4.0)
MCHC: 33.8 g/dL (ref 30.0–36.0)
MCV: 91.4 fl (ref 78.0–100.0)
Monocytes Absolute: 0.3 10*3/uL (ref 0.1–1.0)
Monocytes Relative: 5.8 % (ref 3.0–12.0)
Neutro Abs: 3.4 10*3/uL (ref 1.4–7.7)
Neutrophils Relative %: 66.2 % (ref 43.0–77.0)
Platelets: 266 10*3/uL (ref 150.0–400.0)
RBC: 4.69 Mil/uL (ref 3.87–5.11)
RDW: 12 % (ref 11.5–15.5)
WBC: 5.1 10*3/uL (ref 4.0–10.5)

## 2020-10-27 LAB — COMPREHENSIVE METABOLIC PANEL
ALT: 21 U/L (ref 0–35)
AST: 17 U/L (ref 0–37)
Albumin: 4.1 g/dL (ref 3.5–5.2)
Alkaline Phosphatase: 102 U/L (ref 39–117)
BUN: 11 mg/dL (ref 6–23)
CO2: 26 mEq/L (ref 19–32)
Calcium: 9.1 mg/dL (ref 8.4–10.5)
Chloride: 103 mEq/L (ref 96–112)
Creatinine, Ser: 0.9 mg/dL (ref 0.40–1.20)
GFR: 86.59 mL/min (ref >60.00)
Glucose, Bld: 102 mg/dL — ABNORMAL HIGH (ref 70–99)
Potassium: 4.5 mEq/L (ref 3.5–5.1)
Sodium: 139 mEq/L (ref 135–145)
Total Bilirubin: 0.3 mg/dL (ref 0.2–1.2)
Total Protein: 6.9 g/dL (ref 6.0–8.3)

## 2020-10-27 LAB — HEMOGLOBIN A1C: Hgb A1c MFr Bld: 5.4 % (ref 4.6–6.5)

## 2020-10-27 LAB — LIPID PANEL
Cholesterol: 189 mg/dL (ref 0–200)
HDL: 58.4 mg/dL (ref >39.00)
LDL Cholesterol: 94 mg/dL (ref 0–99)
NonHDL: 130.47
Total CHOL/HDL Ratio: 3
Triglycerides: 180 mg/dL — ABNORMAL HIGH (ref 0.0–149.0)
VLDL: 36 mg/dL (ref 0.0–40.0)

## 2020-10-27 LAB — VITAMIN D 25 HYDROXY (VIT D DEFICIENCY, FRACTURES): VITD: 34.29 ng/mL (ref 30.00–100.00)

## 2020-10-27 LAB — TSH: TSH: 0.18 u[IU]/mL — ABNORMAL LOW (ref 0.35–4.50)

## 2020-10-27 MED ORDER — TRAZODONE HCL 50 MG PO TABS: 50.0000 mg | ORAL_TABLET | Freq: Every day | ORAL | 1 refills | Status: DC

## 2020-10-27 MED ORDER — ESCITALOPRAM OXALATE 10 MG PO TABS: 10.0000 mg | ORAL_TABLET | Freq: Every day | ORAL | 1 refills | Status: DC

## 2020-10-27 NOTE — Assessment & Plan Note (Signed)
Chronic, worsening. Pending baseline xrays.  Continue gabapentin 300mg  bid. Consult with Dr . Consider PT referral.
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
## 2020-10-27 NOTE — Assessment & Plan Note (Signed)
Stable. Controlled.  Continue lexapro 10mg  ,Trazodone 50mg 

## 2020-10-27 NOTE — Patient Instructions (Addendum)
Order for ultrasound abdomen, pelvis  Referral to Dr Yves Dill  Let us know if you dont hear back within a week in regards to an appointment being scheduled.   This is  Dr. Melina Schools  example of a  "Low GI"  Diet:  It will allow you to lose 4 to 8  lbs  per month if you follow it carefully.  Your goal with exercise is a minimum of 30 minutes of aerobic exercise 5 days per week (Walking does not count once it becomes easy!)    All of the foods can be found at grocery stores and in bulk at [employer].  The Atkins protein bars and shakes are available in more varieties at Target, WalMart and Lowe's Foods.     7 AM Breakfast:  Choose from the following:  Low carbohydrate Protein  Shakes (I recommend the  Premier Protein chocolate shakes,  EAS AdvantEdge "Carb Control" shakes  Or the Atkins shakes all are under 3 net carbs)     a scrambled egg/bacon/cheese burrito made with Mission's "carb balance" whole wheat tortilla  (about 10 net carbs )  Medical laboratory scientific officer (basically a quiche without the pastry crust) that is eaten cold and very convenient way to get your eggs.  8 carbs)  If you make your own protein shakes, avoid bananas and pineapple,  And use low carb greek yogurt or original /unsweetened almond or soy milk    Avoid cereal and bananas, oatmeal and cream of wheat and grits. They are loaded with carbohydrates!   10 AM: high protein snack:  Protein bar by Atkins (the snack size, under 200 cal, usually < 6 net carbs).    A stick of cheese:  Around 1 carb,  100 cal     Dannon Light n Fit [place] Yogurt  (80 cal, 8 carbs)  Other so called "protein bars" and Greek yogurts tend to be loaded with carbohydrates.  Remember, in food advertising, the word "energy" is synonymous for " carbohydrate."  Lunch:   A Sandwich using the bread choices listed, Can use any  Eggs,  lunchmeat, grilled meat or canned tuna), avocado, regular mayo/mustard  and cheese.  A Salad using blue cheese,  ranch,  Goddess or vinagrette,  Avoid taco shells, croutons or "confetti" and no "candied nuts" but regular nuts OK.   No pretzels, nabs  or chips.  Pickles and miniature sweet peppers are a good low carb alternative that provide a "crunch"  The bread is the only source of carbohydrate in a sandwich and  can be decreased by trying some of the attached alternatives to traditional loaf bread   Avoid "Low fat dressings, as well as Reyne Dumas and [employer] dressings They are loaded with sugar!   3 PM/ Mid day  Snack:  Consider  1 ounce of  almonds, walnuts, pistachios, pecans, peanuts,  Macadamia nuts or a nut medley.  Avoid "granola and granola bars "  Mixed nuts are ok in moderation as long as there are no raisins,  cranberries or dried fruit.   KIND bars are OK if you get the low glycemic index variety   Try the prosciutto/mozzarella cheese sticks by Fiorruci  In deli /backery section   High protein      6 PM  Dinner:     Meat/fowl/fish with a green salad, and either broccoli, cauliflower, green beans, spinach, brussel sprouts or  Lima beans. DO NOT BREAD THE PROTEIN!!  There is a low carb pasta by Dreamfield's that is acceptable and tastes great: only 5 digestible carbs/serving.( All grocery stores but BJs carry it ) Several ready made meals are available low carb:   Try Michel Angelo's chicken piccata or chicken or eggplant parm over low carb pasta.(Lowes and BJs)   Clifton Custard Sanchez's "Carnitas" (pulled pork, no sauce,  0 carbs) or his beef pot roast to make a dinner burrito (at [employer]J's)  Pesto over low carb pasta (bj's sells a good quality pesto in the center refrigerated section of the deli   Try satueeing  Roosvelt Harps with mushroooms as a good side   [employer] Giant makes a mashed cauliflower that tastes like mashed potatoes  Whole wheat pasta is still full of digestible carbs and  Not as low in glycemic index as Dreamfield's.   Brown rice is still rice,  So skip the rice and noodles if you  eat [place] or [place] (or at least limit to 1/2 cup)  9 PM snack :   Breyer's "low carb" fudgsicle or  ice cream bar (Carb Smart line), or  Weight Watcher's ice cream bar , or another "no sugar added" ice cream;  a serving of fresh berries/cherries with whipped cream   Cheese or DANNON'S LlGHT N FIT GREEK YOGURT  8 ounces of [place] unsweetened almond/cococunut milk    Treat yourself to a parfait made with whipped cream blueberiies, walnuts and vanilla greek yogurt  Avoid bananas, pineapple, grapes  and watermelon on a regular basis because they are high in sugar.  THINK OF THEM AS DESSERT  Remember that snack Substitutions should be less than 10 NET carbs per serving and meals < 20 carbs. Remember to subtract fiber grams to get the "net carbs."

## 2020-10-27 NOTE — Assessment & Plan Note (Addendum)
Benign abdominal exam. Correlates with weight gain. Evaluate for gross abnormal abdominal or pelvic pathology, pending ultrasound. Discussed CT a/p however in setting of shortage of contrast and absence of acute findings today, we agreed ultrasound reasonable first step. Close follow up.

## 2020-10-27 NOTE — Assessment & Plan Note (Addendum)
CBE performed. Deferred pelvic exam as pap smear is UTD. Declines dermatology consult and she will arrange consult on her own.

## 2020-10-30 ENCOUNTER — Other Ambulatory Visit: Payer: Self-pay | Admitting: Family

## 2020-10-30 DIAGNOSIS — R946 Abnormal results of thyroid function studies: Secondary | ICD-10-CM

## 2020-10-30 LAB — HEPATITIS C ANTIBODY
Hepatitis C Ab: NONREACTIVE
SIGNAL TO CUT-OFF: 0 (ref <1.00)

## 2020-10-31 ENCOUNTER — Other Ambulatory Visit: Payer: Commercial Managed Care - PPO

## 2020-11-01 ENCOUNTER — Ambulatory Visit (INDEPENDENT_AMBULATORY_CARE_PROVIDER_SITE_OTHER): Payer: Commercial Managed Care - PPO

## 2020-11-01 ENCOUNTER — Other Ambulatory Visit: Payer: Commercial Managed Care - PPO

## 2020-11-01 ENCOUNTER — Other Ambulatory Visit: Payer: Self-pay

## 2020-11-01 DIAGNOSIS — M549 Dorsalgia, unspecified: Secondary | ICD-10-CM

## 2020-11-01 DIAGNOSIS — R946 Abnormal results of thyroid function studies: Secondary | ICD-10-CM

## 2020-11-02 ENCOUNTER — Other Ambulatory Visit: Payer: Commercial Managed Care - PPO

## 2020-11-08 ENCOUNTER — Ambulatory Visit (HOSPITAL_COMMUNITY): Payer: Commercial Managed Care - PPO

## 2020-11-15 ENCOUNTER — Other Ambulatory Visit: Payer: Self-pay | Admitting: Family

## 2020-11-15 NOTE — Telephone Encounter (Signed)
Last filled 08/02/20 and last OV 10/27/20 okay to fill?

## 2020-11-17 ENCOUNTER — Encounter: Payer: Self-pay | Admitting: Family

## 2020-11-20 ENCOUNTER — Other Ambulatory Visit: Payer: Self-pay | Admitting: Family

## 2020-11-20 DIAGNOSIS — M25849 Other specified joint disorders, unspecified hand: Secondary | ICD-10-CM

## 2020-11-20 DIAGNOSIS — M67431 Ganglion, right wrist: Secondary | ICD-10-CM | POA: Insufficient documentation

## 2020-12-04 ENCOUNTER — Other Ambulatory Visit: Payer: Self-pay | Admitting: Family

## 2020-12-04 ENCOUNTER — Telehealth: Payer: Self-pay | Admitting: Family

## 2020-12-04 DIAGNOSIS — F909 Attention-deficit hyperactivity disorder, unspecified type: Secondary | ICD-10-CM

## 2020-12-04 MED ORDER — AMPHETAMINE-DEXTROAMPHET ER 25 MG PO CP24: 25.0000 mg | ORAL_CAPSULE | Freq: Every morning | ORAL | 0 refills | Status: DC

## 2020-12-04 MED ORDER — AMPHETAMINE-DEXTROAMPHET ER 25 MG PO CP24: 25.0000 mg | ORAL_CAPSULE | ORAL | 0 refills | Status: DC

## 2020-12-04 NOTE — Telephone Encounter (Signed)
"  Rejection Reason - Patient went elsewhere" Gean Maidens said on Dec 04, 2020 12:54 PM  "Pt has been seen at [facility]. She was given Prednisone and will hold off on scheduling for now" Gean Maidens said on Nov 21, 2020 3:31 PM  Msg from [facility] orthopedic surgery

## 2020-12-13 ENCOUNTER — Other Ambulatory Visit: Payer: Self-pay

## 2020-12-13 ENCOUNTER — Ambulatory Visit: Payer: Commercial Managed Care - PPO | Admitting: Family

## 2020-12-13 ENCOUNTER — Encounter: Payer: Self-pay | Admitting: Family

## 2020-12-13 VITALS — BP 110/66 | HR 77 | Temp 98.8°F | Ht 62.2 in | Wt 162.4 lb

## 2020-12-13 DIAGNOSIS — R21 Rash and other nonspecific skin eruption: Secondary | ICD-10-CM

## 2020-12-13 DIAGNOSIS — Q796 Ehlers-Danlos syndrome, unspecified: Secondary | ICD-10-CM | POA: Insufficient documentation

## 2020-12-13 DIAGNOSIS — G43801 Other migraine, not intractable, with status migrainosus: Secondary | ICD-10-CM | POA: Diagnosis not present

## 2020-12-13 DIAGNOSIS — F909 Attention-deficit hyperactivity disorder, unspecified type: Secondary | ICD-10-CM | POA: Diagnosis not present

## 2020-12-13 MED ORDER — AMPHETAMINE-DEXTROAMPHET ER 30 MG PO CP24: 30.0000 mg | ORAL_CAPSULE | Freq: Every day | ORAL | 0 refills | Status: DC

## 2020-12-13 MED ORDER — MUPIROCIN 2 % EX OINT: 1.0000 "application " | TOPICAL_OINTMENT | Freq: Two times a day (BID) | CUTANEOUS | 0 refills | Status: DC

## 2020-12-13 NOTE — Assessment & Plan Note (Signed)
Uncontrolled. Increase adderall XR to 25mg . She will monitor closely for increase of anxiety, insomnia, heart palpitations. We may consider Vyvanse in the future.

## 2020-12-13 NOTE — Patient Instructions (Addendum)
Referral to cardiology and ophthalmology Let us know if you dont hear back within a week in regards to an appointment being scheduled.
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
## 2020-12-13 NOTE — Assessment & Plan Note (Addendum)
New diagnosis to me. Diagnosed as a child. Discussed importance of surveillance in setting of complication of connective tissue and therefore placed referral to ophthalmology and cardiology. She will also discuss with children's pediatrician.  Discussed presenting to ED if she were to have acute abdominal or back pain, vision loss, unusual joint pain, joint injury or swelling. Will follow.

## 2020-12-19 ENCOUNTER — Other Ambulatory Visit: Payer: Self-pay | Admitting: Family

## 2020-12-19 DIAGNOSIS — S39012A Strain of muscle, fascia and tendon of lower back, initial encounter: Secondary | ICD-10-CM

## 2020-12-22 ENCOUNTER — Encounter: Payer: Self-pay | Admitting: Cardiology

## 2020-12-22 ENCOUNTER — Other Ambulatory Visit: Payer: Self-pay

## 2020-12-22 ENCOUNTER — Ambulatory Visit: Payer: Commercial Managed Care - PPO | Admitting: Cardiology

## 2020-12-22 VITALS — BP 108/72 | HR 77 | Ht 62.0 in | Wt 161.0 lb

## 2020-12-22 DIAGNOSIS — R42 Dizziness and giddiness: Secondary | ICD-10-CM

## 2020-12-22 DIAGNOSIS — Q796 Ehlers-Danlos syndrome, unspecified: Secondary | ICD-10-CM
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
## 2020-12-22 NOTE — Patient Instructions (Signed)
Medication Instructions:  Your physician recommends that you continue on your current medications as directed. Please refer to the Current Medication list given to you today.  *If you need a refill on your cardiac medications before your next appointment, please call your pharmacy*   Lab Work: None ordered If you have labs (blood work) drawn today and your tests are completely normal, you will receive your results only by: MyChart Message (if you have MyChart) OR A paper copy in the mail If you have any lab test that is abnormal or we need to change your treatment, we will call you to review the results.   Testing/Procedures:   Your physician has requested that you have an echocardiogram. Echocardiography is a painless test that uses sound waves to create images of your heart. It provides your doctor with information about the size and shape of your heart and how well your heart's chambers and valves are working. This procedure takes approximately one hour. There are no restrictions for this procedure.    Follow-Up: At [facility], you and your health needs are our priority.  As part of our continuing mission to provide you with exceptional heart care, we have created designated Provider Care Teams.  These Care Teams include your primary Cardiologist (physician) and Advanced Practice Providers (APPs -  Physician Assistants and Nurse Practitioners) who all work together to provide you with the care you need, when you need it.  We recommend signing up for the patient portal called "MyChart".  Sign up information is provided on this After Visit Summary.  MyChart is used to connect with patients for Virtual Visits (Telemedicine).  Patients are able to view lab/test results, encounter notes, upcoming appointments, etc.  Non-urgent messages can be sent to your provider as well.   To learn more about what you can do with MyChart, go to [email or web address].    Your next appointment:    Follow up after testing   The format for your next appointment:   In Person  Provider:   Brian Agbor-Etang, MD   Other Instructions   

## 2021-01-02 ENCOUNTER — Telehealth: Payer: Self-pay | Admitting: Family

## 2021-01-02 NOTE — Telephone Encounter (Signed)
Rejection Reason - Other - Patient cancelled appoinment and did not reschedule" Susan Adkins said on Jan 02, 2021 2:38 PM  Msg from [place] [facility]

## 2021-01-10 ENCOUNTER — Other Ambulatory Visit: Payer: Self-pay

## 2021-01-10 ENCOUNTER — Ambulatory Visit (INDEPENDENT_AMBULATORY_CARE_PROVIDER_SITE_OTHER): Payer: Commercial Managed Care - PPO

## 2021-01-10 DIAGNOSIS — Q796 Ehlers-Danlos syndrome, unspecified: Secondary | ICD-10-CM | POA: Diagnosis not present

## 2021-01-13 LAB — ECHOCARDIOGRAM COMPLETE
AR max vel: 1.92 cm2
AV Area VTI: 1.89 cm2
AV Area mean vel: 1.92 cm2
AV Mean grad: 5 mmHg
AV Peak grad: 9 mmHg
Ao pk vel: 1.5 m/s
Area-P 1/2: 3.99 cm2
MV VTI: 2.15 cm2
S' Lateral: 2.8 cm
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
## 2021-01-19 ENCOUNTER — Ambulatory Visit: Payer: Commercial Managed Care - PPO | Admitting: Medical

## 2021-01-22 ENCOUNTER — Telehealth: Payer: Self-pay | Admitting: Family

## 2021-01-22 ENCOUNTER — Emergency Department: Payer: Commercial Managed Care - PPO

## 2021-01-22 ENCOUNTER — Other Ambulatory Visit: Payer: Self-pay

## 2021-01-22 ENCOUNTER — Encounter: Payer: Self-pay | Admitting: Emergency Medicine

## 2021-01-22 ENCOUNTER — Emergency Department
Admission: EM | Admit: 2021-01-22 | Discharge: 2021-01-22 | Disposition: A | Discharge status: Home / Self Care | Payer: Commercial Managed Care - PPO | Attending: Emergency Medicine | Admitting: Emergency Medicine

## 2021-01-22 DIAGNOSIS — F840 Autistic disorder: Secondary | ICD-10-CM | POA: Insufficient documentation

## 2021-01-22 DIAGNOSIS — R1013 Epigastric pain: Secondary | ICD-10-CM

## 2021-01-22 DIAGNOSIS — K5900 Constipation, unspecified: Secondary | ICD-10-CM | POA: Insufficient documentation

## 2021-01-22 DIAGNOSIS — N9489 Other specified conditions associated with female genital organs and menstrual cycle: Secondary | ICD-10-CM | POA: Insufficient documentation

## 2021-01-22 DIAGNOSIS — K120 Recurrent oral aphthae: Secondary | ICD-10-CM

## 2021-01-22 DIAGNOSIS — Z79899 Other long term (current) drug therapy: Secondary | ICD-10-CM | POA: Diagnosis not present

## 2021-01-22 DIAGNOSIS — J45909 Unspecified asthma, uncomplicated: Secondary | ICD-10-CM | POA: Insufficient documentation

## 2021-01-22 DIAGNOSIS — R110 Nausea: Secondary | ICD-10-CM | POA: Insufficient documentation

## 2021-01-22 DIAGNOSIS — R1011 Right upper quadrant pain: Secondary | ICD-10-CM

## 2021-01-22 DIAGNOSIS — R0789 Other chest pain: Secondary | ICD-10-CM | POA: Insufficient documentation

## 2021-01-22 LAB — BASIC METABOLIC PANEL
Anion gap: 5 (ref 5–15)
BUN: 12 mg/dL (ref 6–20)
CO2: 27 mmol/L (ref 22–32)
Calcium: 8.7 mg/dL — ABNORMAL LOW (ref 8.9–10.3)
Chloride: 106 mmol/L (ref 98–111)
Creatinine, Ser: 1 mg/dL (ref 0.44–1.00)
GFR, Estimated: >60 mL/min (ref >60)
Glucose, Bld: 113 mg/dL — ABNORMAL HIGH (ref 70–99)
Potassium: 4.2 mmol/L (ref 3.5–5.1)
Sodium: 138 mmol/L (ref 135–145)

## 2021-01-22 LAB — CBC
HCT: 42.6 % (ref 36.0–46.0)
Hemoglobin: 14.6 g/dL (ref 12.0–15.0)
MCH: 32 pg (ref 26.0–34.0)
MCHC: 34.3 g/dL (ref 30.0–36.0)
MCV: 93.4 fL (ref 80.0–100.0)
Platelets: 291 10*3/uL (ref 150–400)
RBC: 4.56 MIL/uL (ref 3.87–5.11)
RDW: 12.1 % (ref 11.5–15.5)
WBC: 8.4 10*3/uL (ref 4.0–10.5)
nRBC: 0 % (ref 0.0–0.2)

## 2021-01-22 LAB — HEPATIC FUNCTION PANEL
ALT: 27 U/L (ref 0–44)
AST: 22 U/L (ref 15–41)
Albumin: 3.7 g/dL (ref 3.5–5.0)
Alkaline Phosphatase: 101 U/L (ref 38–126)
Bilirubin, Direct: <0.1 mg/dL (ref 0.0–0.2)
Total Bilirubin: 0.7 mg/dL (ref 0.3–1.2)
Total Protein: 7.1 g/dL (ref 6.5–8.1)

## 2021-01-22 LAB — URINALYSIS, COMPLETE (UACMP) WITH MICROSCOPIC
Bilirubin Urine: NEGATIVE
Glucose, UA: NEGATIVE mg/dL
Hgb urine dipstick: NEGATIVE
Ketones, ur: NEGATIVE mg/dL
Nitrite: NEGATIVE
Protein, ur: NEGATIVE mg/dL
Specific Gravity, Urine: 1.017 (ref 1.005–1.030)
pH: 6 (ref 5.0–8.0)

## 2021-01-22 LAB — LIPASE, BLOOD: Lipase: 36 U/L (ref 11–51)

## 2021-01-22 LAB — POC URINE PREG, ED: Preg Test, Ur: NEGATIVE

## 2021-01-22 LAB — TROPONIN I (HIGH SENSITIVITY): Troponin I (High Sensitivity): <2 ng/L (ref <18)

## 2021-01-22 LAB — HCG, QUANTITATIVE, PREGNANCY: hCG, Beta Chain, Quant, S: <1 m[IU]/mL (ref <5)

## 2021-01-22 MED ORDER — SUCRALFATE 1 G PO TABS
1.0000 g | ORAL_TABLET | Freq: Once | ORAL | Status: AC
  Administered 2021-01-22: 1 g via ORAL
  Filled 2021-01-22: qty 1

## 2021-01-22 MED ORDER — PANTOPRAZOLE SODIUM 40 MG PO TBEC
40.0000 mg | DELAYED_RELEASE_TABLET | Freq: Once | ORAL | Status: AC
  Administered 2021-01-22: 40 mg via ORAL
  Filled 2021-01-22: qty 1

## 2021-01-22 MED ORDER — ALUM & MAG HYDROXIDE-SIMETH 200-200-20 MG/5ML PO SUSP
30.0000 mL | Freq: Once | ORAL | Status: AC
  Administered 2021-01-22: 30 mL via ORAL
  Filled 2021-01-22: qty 30

## 2021-01-22 MED ORDER — FAMOTIDINE 20 MG PO TABS: 20.0000 mg | ORAL_TABLET | Freq: Two times a day (BID) | ORAL | 0 refills | Status: DC

## 2021-01-22 MED ORDER — IOHEXOL 350 MG/ML SOLN
100.0000 mL | Freq: Once | INTRAVENOUS | Status: AC | PRN
  Administered 2021-01-22: 100 mL via INTRAVENOUS

## 2021-01-22 NOTE — ED Provider Notes (Signed)
The patient was signed out to me pending a CT angio.  Presented with epigastric pain with a history of Ehlers-Danlos syndrome.  Her CT angio was negative for acute aortic syndrome or other abdominal pathology.  On my evaluation patient feels improved.  Will discharge with Pepcid.   Georga Hacking, MD 01/22/21 [phone]

## 2021-01-22 NOTE — ED Notes (Signed)
Patient is resting comfortably. 

## 2021-01-22 NOTE — Telephone Encounter (Signed)
LMTCB to see if pt is going to ED.
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
## 2021-01-22 NOTE — Telephone Encounter (Signed)
Transferred to access nurse  Pt stated that she is having sever upper abdominal pain and tightness

## 2021-01-22 NOTE — ED Triage Notes (Signed)
Pt reports that she developed mouth ulcers on Saturday, they got worse on Sunday and today when she woke up, she was having epigastric pain. She is having nausea but no vomiting or diarrhea.

## 2021-01-22 NOTE — Telephone Encounter (Signed)
Pt in ed 

## 2021-01-23 ENCOUNTER — Ambulatory Visit: Payer: Commercial Managed Care - PPO

## 2021-01-27 ENCOUNTER — Other Ambulatory Visit: Payer: Self-pay | Admitting: Family

## 2021-01-27 DIAGNOSIS — F419 Anxiety disorder, unspecified: Secondary | ICD-10-CM

## 2021-01-27 DIAGNOSIS — F32A Depression, unspecified: Secondary | ICD-10-CM

## 2021-01-28 ENCOUNTER — Other Ambulatory Visit: Payer: Self-pay | Admitting: Family

## 2021-01-28 DIAGNOSIS — F419 Anxiety disorder, unspecified: Secondary | ICD-10-CM

## 2021-01-30 NOTE — Telephone Encounter (Signed)
Please advise okay to fill shows DC by PCP on 08/01/2020

## 2021-01-30 NOTE — Telephone Encounter (Signed)
Okay to refill Hydroxyzine patient last OV 6/22 medication removed from list was this DC?

## 2021-02-15 ENCOUNTER — Encounter: Payer: Self-pay | Admitting: Family

## 2021-02-20 ENCOUNTER — Other Ambulatory Visit: Payer: Self-pay | Admitting: Family

## 2021-02-20 DIAGNOSIS — F909 Attention-deficit hyperactivity disorder, unspecified type: Secondary | ICD-10-CM

## 2021-02-20 DIAGNOSIS — S39012A Strain of muscle, fascia and tendon of lower back, initial encounter: Secondary | ICD-10-CM

## 2021-02-20 MED ORDER — LISDEXAMFETAMINE DIMESYLATE 20 MG PO CAPS: 20.0000 mg | ORAL_CAPSULE | Freq: Every day | ORAL | 0 refills | Status: DC

## 2021-02-20 NOTE — Progress Notes (Signed)
I looked up patient on [place] Controlled Substances Reporting System PMP AWARE and saw no activity that raised concern of inappropriate use.   

## 2021-02-22 ENCOUNTER — Telehealth: Payer: Self-pay

## 2021-02-22 NOTE — Telephone Encounter (Signed)
Prior authorization for Vyvanse has been sent in through covermymeds. Awaiting determination.  Key: [id number]

## 2021-02-28 ENCOUNTER — Other Ambulatory Visit: Payer: Self-pay | Admitting: Family

## 2021-03-16 ENCOUNTER — Ambulatory Visit: Payer: Commercial Managed Care - PPO | Admitting: Family

## 2021-03-16 ENCOUNTER — Encounter: Payer: Self-pay | Admitting: Family

## 2021-03-16 ENCOUNTER — Telehealth (INDEPENDENT_AMBULATORY_CARE_PROVIDER_SITE_OTHER): Payer: Commercial Managed Care - PPO | Admitting: Family

## 2021-03-16 VITALS — Ht 62.0 in | Wt 165.0 lb

## 2021-03-16 DIAGNOSIS — Q796 Ehlers-Danlos syndrome, unspecified: Secondary | ICD-10-CM

## 2021-03-16 DIAGNOSIS — G43801 Other migraine, not intractable, with status migrainosus: Secondary | ICD-10-CM

## 2021-03-16 DIAGNOSIS — F909 Attention-deficit hyperactivity disorder, unspecified type: Secondary | ICD-10-CM | POA: Diagnosis not present

## 2021-03-16 DIAGNOSIS — E063 Autoimmune thyroiditis: Secondary | ICD-10-CM | POA: Diagnosis not present

## 2021-03-16 DIAGNOSIS — F32A Depression, unspecified: Secondary | ICD-10-CM

## 2021-03-16 DIAGNOSIS — F419 Anxiety disorder, unspecified: Secondary | ICD-10-CM

## 2021-03-16 DIAGNOSIS — E069 Thyroiditis, unspecified: Secondary | ICD-10-CM

## 2021-03-16 MED ORDER — LEVOTHYROXINE SODIUM 112 MCG PO TABS: 112.0000 ug | ORAL_TABLET | Freq: Every day | ORAL | 1 refills | Status: DC

## 2021-03-16 MED ORDER — LISDEXAMFETAMINE DIMESYLATE 30 MG PO CAPS: 30.0000 mg | ORAL_CAPSULE | Freq: Every day | ORAL | 0 refills | Status: DC

## 2021-03-16 NOTE — Assessment & Plan Note (Signed)
Very pleased that symptoms have improved and that headaches resolved.  Continue Vyvanse.  At next refill she will start Vyvanse 30 mg.  New prescription has been sent.I looked up patient on [place] Controlled Substances Reporting System PMP AWARE and saw no activity that raised concern of inappropriate use.

## 2021-03-16 NOTE — Assessment & Plan Note (Signed)
Reassuring evaluation from cardiology.  She will make appointment with ophthalmologist soon.  Referral has been placed.

## 2021-03-16 NOTE — Assessment & Plan Note (Signed)
Lab Results  Component Value Date   TSH 0.18 (L) 10/27/2020   Pending thyroid studies, will consult with Dr. Tedd Sias based on appropriate TSH levels.  Refilled Synthroid 112 MCG for now.

## 2021-03-16 NOTE — Assessment & Plan Note (Signed)
Excellent control.  Continue propranolol 80 mg

## 2021-03-16 NOTE — Assessment & Plan Note (Signed)
Chronic, stable.  Continue trazodone 50mg , lexapro 10mg .

## 2021-03-16 NOTE — Progress Notes (Addendum)
Virtual Visit via Video Note  I connected with@  on 03/16/21 at 12:15 PM EDT by a video enabled telemedicine application and verified that I am speaking with the correct person using two identifiers.  Location patient: home Location provider:work  Persons participating in the virtual visit: patient, provider  I discussed the limitations of evaluation and management by telemedicine and the availability of in person appointments. The patient expressed understanding and agreed to proceed.  Interactive audio and video telecommunications were attempted between this provider and patient, however failed, due to patient having technical difficulties or patient did not have access to video capability.  We continued and completed visit with audio only.    HPI: Feels well today.  No new complaints. ADHD-recently started Vyvanse.  She is feeling 'much much better'.  Concentration improved although she would like to increase Vyvanse dose.    HAs in the evenings have stopped. She has stopped Adderall .  Anxiety improved on Vyvanse.  HA -compliant with propranolol 80 mg.  Headaches well controlled  Post operative Hypothyroidism- compliant with synthroid . She would like for me to resume care as Jodi Geralds no longer practicing.  H/o thyroidectomy, hashimoto's.   She has consult with Dr. Sandie Ano regarding  Ehlers-Danlos syndrome.  Echocardiogram normal and no further follow up.   She plans to schedule opthalmology appointment  Depression and anxiety - compliant with trazodone 50mg , lexapro 10mg . Feels well on current regimen.   ROS: See pertinent positives and negatives per HPI.    EXAM:  VITALS per patient if applicable: Ht 5\' 2"  (1.575 m)   Wt 165 lb (74.8 kg)   BMI 30.18 kg/m  BP Readings from Last 3 Encounters:  01/22/21 107/77  12/22/20 108/72  12/13/20 110/66   Wt Readings from Last 3 Encounters:  03/16/21 165 lb (74.8 kg)  01/22/21 165 lb (74.8 kg)  12/22/20 161 lb  (73 kg)    GENERAL: alert, oriented, appears well and in no acute distress ASSESSMENT AND PLAN:  Discussed the following assessment and plan:  Problem List Items Addressed This Visit       Cardiovascular and Mediastinum   Migraine    Excellent control.  Continue propranolol 80 mg        Endocrine   Hashimoto's thyroiditis   Relevant Medications   levothyroxine (SYNTHROID) 112 MCG tablet   Other Relevant Orders   TSH   T4, free   T3, free   Thyroiditis    Lab Results  Component Value Date   TSH 0.18 (L) 10/27/2020  Pending thyroid studies, will consult with Dr. 03/24/21 based on appropriate TSH levels.  Refilled Synthroid 112 MCG for now.      Relevant Medications   levothyroxine (SYNTHROID) 112 MCG tablet     Musculoskeletal and Integument   Ehlers-Danlos syndrome    Reassuring evaluation from cardiology.  She will make appointment with ophthalmologist soon.  Referral has been placed.        Other   ADHD - Primary    Very pleased that symptoms have improved and that headaches resolved.  Continue Vyvanse.  At next refill she will start Vyvanse 30 mg.  New prescription has been sent.I looked up patient on [place] Controlled Substances Reporting System PMP AWARE and saw no activity that raised concern of inappropriate use.        Relevant Medications   lisdexamfetamine (VYVANSE) 30 MG capsule   Anxiety and depression    Chronic, stable.  Continue trazodone 50mg , lexapro 10mg .       -  we discussed possible serious and likely etiologies, options for evaluation and workup, limitations of telemedicine visit vs in person visit, treatment, treatment risks and precautions. Pt prefers to treat via telemedicine empirically rather then risking or undertaking an in person visit at this moment.  .   I discussed the assessment and treatment plan with the patient. The patient was provided an opportunity to ask questions and all were answered. The patient agreed with the plan and  demonstrated an understanding of the instructions.   The patient was advised to call back or seek an in-person evaluation if the symptoms worsen or if the condition fails to improve as anticipated.  Rennie Plowman, FNP   I have spent 16 minutes on phone with a patient including precharting, exam, reviewing medical records, and discussion plan of care.

## 2021-03-27 ENCOUNTER — Other Ambulatory Visit: Payer: Self-pay | Admitting: Family

## 2021-03-27 DIAGNOSIS — F419 Anxiety disorder, unspecified: Secondary | ICD-10-CM

## 2021-04-02 ENCOUNTER — Other Ambulatory Visit: Payer: Self-pay | Admitting: Specialist
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
## 2021-04-12 ENCOUNTER — Other Ambulatory Visit: Payer: Self-pay

## 2021-04-12 ENCOUNTER — Encounter
Admission: RE | Admit: 2021-04-12 | Discharge: 2021-04-12 | Disposition: A | Discharge status: Home / Self Care | Payer: Commercial Managed Care - PPO | Source: Ambulatory Visit | Attending: Specialist | Admitting: Specialist

## 2021-04-12 NOTE — Patient Instructions (Addendum)
Your procedure is scheduled on: Thursday 04/19/21 Report to the Registration Desk on the 1st floor of the [facility]. To find out your arrival time, please call [phone] between 1PM - 3PM on: Friday 04/18/21  REMEMBER: Instructions that are not followed completely may result in serious medical risk, up to and including death; or upon the discretion of your surgeon and anesthesiologist your surgery may need to be rescheduled.  Do not eat food after midnight the night before surgery.  No gum chewing, lozengers or hard candies.  You may however, drink CLEAR liquids up to 2 hours before you are scheduled to arrive for your surgery. Do not drink anything within 2 hours of your scheduled arrival time.  Clear liquids include: - water  - apple juice without pulp - gatorade (not RED, PURPLE, OR BLUE) - black coffee or tea (Do NOT add milk or creamers to the coffee or tea) Do NOT drink anything that is not on this list.  TAKE THESE MEDICATIONS THE MORNING OF SURGERY WITH A SIP OF WATER: levothyroxine (SYNTHROID) 112 MCG tablet  Use albuterol (VENTOLIN HFA) 108 (90 Base) MCG/ACT inhaler inhaler on the day of surgery and bring to the hospital.  One week prior to surgery: Stop Anti-inflammatories (NSAIDS) such as Advil, Aleve, Ibuprofen, Motrin, Naproxen, Naprosyn and Aspirin based products such as Excedrin, Goodys Powder, BC Powder. Stop cetirizine (ZYRTEC) 10 MG tablet, and ANY OVER THE COUNTER supplements until after surgery. You may however, continue to take Tylenol if needed for pain up until the day of surgery.  No Alcohol for 24 hours before or after surgery.  No Smoking including e-cigarettes for 24 hours prior to surgery.  No chewable tobacco products for at least 6 hours prior to surgery.  No nicotine patches on the day of surgery.  Do not use any "recreational" drugs for at least a week prior to your surgery.  Please be advised that the combination of cocaine and anesthesia  may have negative outcomes, up to and including death. If you test positive for cocaine, your surgery will be cancelled.  On the morning of surgery brush your teeth with toothpaste and water, you may rinse your mouth with mouthwash if you wish. Do not swallow any toothpaste or mouthwash.  Do not wear jewelry, make-up, hairpins, clips or nail polish.  Do not wear lotions, powders, or perfumes.   Do not shave body from the neck down 48 hours prior to surgery just in case you cut yourself which could leave a site for infection.  Also, freshly shaved skin may become irritated if using the CHG soap.  Do not bring valuables to the hospital. [facility] is not responsible for any missing/lost belongings or valuables.   Notify your doctor if there is any change in your medical condition (cold, fever, infection).  Wear comfortable clothing (specific to your surgery type) to the hospital.  After surgery, you can help prevent lung complications by doing breathing exercises.  Take deep breaths and cough every 1-2 hours.   If you are being discharged the day of surgery, you will not be allowed to drive home. You will need a responsible adult (18 years or older) to drive you home and stay with you that night.   If you are taking public transportation, you will need to have a responsible adult (18 years or older) with you. Please confirm with your physician that it is acceptable to use public transportation.   Please call the Pre-admissions Testing Dept.  at [phone] if you have any questions about these instructions.  Surgery Visitation Policy:  Patients undergoing a surgery or procedure may have one family member or support person with them as long as that person is not COVID-19 positive or experiencing its symptoms.  That person may remain in the waiting area during the procedure and may rotate out with other people.  Inpatient Visitation:    Visiting hours are 7 a.m. to 8 p.m. Up to  two visitors ages 16+ are allowed at one time in a patient room. The visitors may rotate out with other people during the day. Visitors must check out when they leave, or other visitors will not be allowed. One designated support person may remain overnight. The visitor must pass COVID-19 screenings, use hand sanitizer when entering and exiting the patient's room and wear a mask at all times, including in the patient's room. Patients must also wear a mask when staff or their visitor are in the room. Masking is required regardless of vaccination status.

## 2021-04-19 ENCOUNTER — Ambulatory Visit: Payer: Commercial Managed Care - PPO | Admitting: Anesthesiology

## 2021-04-19 ENCOUNTER — Encounter
Admission: RE | Disposition: A | Discharge status: Home / Self Care | Payer: Self-pay | Source: Ambulatory Visit | Attending: Specialist

## 2021-04-19 ENCOUNTER — Ambulatory Visit
Admission: RE | Admit: 2021-04-19 | Discharge: 2021-04-19 | Disposition: A | Discharge status: Home / Self Care | Payer: Commercial Managed Care - PPO | Source: Ambulatory Visit | Attending: Specialist | Admitting: Specialist

## 2021-04-19 ENCOUNTER — Other Ambulatory Visit: Payer: Self-pay

## 2021-04-19 ENCOUNTER — Encounter: Payer: Self-pay | Admitting: Specialist

## 2021-04-19 DIAGNOSIS — Z6830 Body mass index (BMI) 30.0-30.9, adult: Secondary | ICD-10-CM | POA: Diagnosis not present

## 2021-04-19 DIAGNOSIS — Z791 Long term (current) use of non-steroidal anti-inflammatories (NSAID): Secondary | ICD-10-CM | POA: Diagnosis not present

## 2021-04-19 DIAGNOSIS — M67431 Ganglion, right wrist: Secondary | ICD-10-CM | POA: Insufficient documentation

## 2021-04-19 DIAGNOSIS — E669 Obesity, unspecified: Secondary | ICD-10-CM | POA: Diagnosis not present

## 2021-04-19 HISTORY — PX: GANGLION CYST EXCISION: SHX1691

## 2021-04-19 LAB — POCT PREGNANCY, URINE: Preg Test, Ur: NEGATIVE

## 2021-04-19 SURGERY — EXCISION, GANGLION CYST, WRIST
Anesthesia: General | Site: Wrist | Laterality: Right

## 2021-04-19 MED ORDER — PROPOFOL 500 MG/50ML IV EMUL
INTRAVENOUS | Status: AC
  Filled 2021-04-19: qty 50

## 2021-04-19 MED ORDER — BUPIVACAINE HCL 0.5 % IJ SOLN
INTRAMUSCULAR | Status: DC | PRN
  Administered 2021-04-19: 14 mL

## 2021-04-19 MED ORDER — 0.9 % SODIUM CHLORIDE (POUR BTL) OPTIME
TOPICAL | Status: DC | PRN
  Administered 2021-04-19: 50 mL

## 2021-04-19 MED ORDER — GABAPENTIN 300 MG PO CAPS
ORAL_CAPSULE | ORAL | Status: AC
  Administered 2021-04-19: 300 mg via ORAL
  Filled 2021-04-19: qty 1

## 2021-04-19 MED ORDER — LACTATED RINGERS IV SOLN: INTRAVENOUS | Status: DC

## 2021-04-19 MED ORDER — HYDROCODONE-ACETAMINOPHEN 5-325 MG PO TABS
1.0000 | ORAL_TABLET | Freq: Four times a day (QID) | ORAL | 0 refills | Status: DC | PRN
Start: 2021-04-19 — End: 2021-07-02

## 2021-04-19 MED ORDER — CEFAZOLIN SODIUM-DEXTROSE 2-4 GM/100ML-% IV SOLN
INTRAVENOUS | Status: AC
  Filled 2021-04-19: qty 100

## 2021-04-19 MED ORDER — FENTANYL CITRATE (PF) 100 MCG/2ML IJ SOLN
INTRAMUSCULAR | Status: AC
  Administered 2021-04-19: 25 ug via INTRAVENOUS
  Filled 2021-04-19: qty 2

## 2021-04-19 MED ORDER — MELOXICAM 7.5 MG PO TABS
ORAL_TABLET | ORAL | Status: AC
  Administered 2021-04-19: 15 mg via ORAL
  Filled 2021-04-19: qty 2

## 2021-04-19 MED ORDER — FENTANYL CITRATE (PF) 100 MCG/2ML IJ SOLN
INTRAMUSCULAR | Status: DC | PRN
  Administered 2021-04-19 (×2): 50 ug via INTRAVENOUS

## 2021-04-19 MED ORDER — DIPHENHYDRAMINE HCL 50 MG/ML IJ SOLN
INTRAMUSCULAR | Status: AC
  Filled 2021-04-19: qty 1

## 2021-04-19 MED ORDER — LIDOCAINE HCL (CARDIAC) PF 100 MG/5ML IV SOSY
PREFILLED_SYRINGE | INTRAVENOUS | Status: DC | PRN
  Administered 2021-04-19: 50 mg via INTRAVENOUS

## 2021-04-19 MED ORDER — CLINDAMYCIN PHOSPHATE 600 MG/50ML IV SOLN
INTRAVENOUS | Status: AC
  Filled 2021-04-19: qty 50

## 2021-04-19 MED ORDER — BUPIVACAINE HCL (PF) 0.5 % IJ SOLN
INTRAMUSCULAR | Status: AC
  Filled 2021-04-19: qty 30

## 2021-04-19 MED ORDER — MELOXICAM 15 MG PO TABS: 15.0000 mg | ORAL_TABLET | Freq: Every day | ORAL | 3 refills | Status: DC

## 2021-04-19 MED ORDER — PROPOFOL 500 MG/50ML IV EMUL
INTRAVENOUS | Status: DC | PRN
  Administered 2021-04-19: 150 ug/kg/min via INTRAVENOUS

## 2021-04-19 MED ORDER — OXYCODONE HCL 5 MG/5ML PO SOLN: 5.0000 mg | Freq: Once | ORAL | Status: DC | PRN

## 2021-04-19 MED ORDER — LIDOCAINE HCL (CARDIAC) PF 100 MG/5ML IV SOSY: PREFILLED_SYRINGE | INTRAVENOUS | Status: DC | PRN

## 2021-04-19 MED ORDER — ORAL CARE MOUTH RINSE: 15.0000 mL | Freq: Once | OROMUCOSAL | Status: AC

## 2021-04-19 MED ORDER — CHLORHEXIDINE GLUCONATE CLOTH 2 % EX PADS: 6.0000 | MEDICATED_PAD | Freq: Once | CUTANEOUS | Status: DC

## 2021-04-19 MED ORDER — FENTANYL CITRATE (PF) 100 MCG/2ML IJ SOLN
25.0000 ug | INTRAMUSCULAR | Status: DC | PRN
  Administered 2021-04-19: 25 ug via INTRAVENOUS

## 2021-04-19 MED ORDER — ACETAMINOPHEN 10 MG/ML IV SOLN
INTRAVENOUS | Status: AC
  Filled 2021-04-19: qty 100

## 2021-04-19 MED ORDER — MIDAZOLAM HCL 2 MG/2ML IJ SOLN
INTRAMUSCULAR | Status: DC | PRN
  Administered 2021-04-19: 2 mg via INTRAVENOUS

## 2021-04-19 MED ORDER — FAMOTIDINE 20 MG PO TABS: 20.0000 mg | ORAL_TABLET | Freq: Once | ORAL | Status: AC

## 2021-04-19 MED ORDER — DIPHENHYDRAMINE HCL 50 MG/ML IJ SOLN
INTRAMUSCULAR | Status: DC | PRN
  Administered 2021-04-19: 12.5 mg via INTRAVENOUS

## 2021-04-19 MED ORDER — GABAPENTIN 400 MG PO CAPS: 400.0000 mg | ORAL_CAPSULE | Freq: Three times a day (TID) | ORAL | 3 refills | Status: DC

## 2021-04-19 MED ORDER — MELOXICAM 7.5 MG PO TABS: 15.0000 mg | ORAL_TABLET | ORAL | Status: AC

## 2021-04-19 MED ORDER — ONDANSETRON HCL 4 MG/2ML IJ SOLN: 4.0000 mg | Freq: Once | INTRAMUSCULAR | Status: DC | PRN

## 2021-04-19 MED ORDER — FAMOTIDINE 20 MG PO TABS
ORAL_TABLET | ORAL | Status: AC
  Administered 2021-04-19: 20 mg via ORAL
  Filled 2021-04-19: qty 1

## 2021-04-19 MED ORDER — MIDAZOLAM HCL 2 MG/2ML IJ SOLN
INTRAMUSCULAR | Status: AC
  Filled 2021-04-19: qty 2

## 2021-04-19 MED ORDER — FENTANYL CITRATE (PF) 100 MCG/2ML IJ SOLN
INTRAMUSCULAR | Status: AC
  Filled 2021-04-19: qty 2

## 2021-04-19 MED ORDER — CEFAZOLIN SODIUM-DEXTROSE 2-4 GM/100ML-% IV SOLN
2.0000 g | INTRAVENOUS | Status: AC
  Administered 2021-04-19: 2 g via INTRAVENOUS

## 2021-04-19 MED ORDER — CHLORHEXIDINE GLUCONATE 0.12 % MT SOLN
OROMUCOSAL | Status: AC
  Administered 2021-04-19: 15 mL via OROMUCOSAL
  Filled 2021-04-19: qty 15

## 2021-04-19 MED ORDER — ACETAMINOPHEN 10 MG/ML IV SOLN
INTRAVENOUS | Status: DC | PRN
  Administered 2021-04-19: 1000 mg via INTRAVENOUS

## 2021-04-19 MED ORDER — ACETAMINOPHEN 10 MG/ML IV SOLN: 1000.0000 mg | Freq: Once | INTRAVENOUS | Status: DC | PRN

## 2021-04-19 MED ORDER — CLINDAMYCIN PHOSPHATE 600 MG/50ML IV SOLN
600.0000 mg | INTRAVENOUS | Status: AC
  Administered 2021-04-19: 600 mg via INTRAVENOUS

## 2021-04-19 MED ORDER — CHLORHEXIDINE GLUCONATE 0.12 % MT SOLN: 15.0000 mL | Freq: Once | OROMUCOSAL | Status: AC

## 2021-04-19 MED ORDER — OXYCODONE HCL 5 MG PO TABS: 5.0000 mg | ORAL_TABLET | Freq: Once | ORAL | Status: DC | PRN

## 2021-04-19 MED ORDER — ONDANSETRON HCL 4 MG/2ML IJ SOLN
INTRAMUSCULAR | Status: DC | PRN
  Administered 2021-04-19: 4 mg via INTRAVENOUS

## 2021-04-19 MED ORDER — GABAPENTIN 300 MG PO CAPS: 300.0000 mg | ORAL_CAPSULE | ORAL | Status: AC

## 2021-04-19 SURGICAL SUPPLY — 34 items
APL PRP STRL LF DISP 70% ISPRP (MISCELLANEOUS) ×1
BLADE SURG MINI STRL (BLADE) ×2 IMPLANT
BNDG ESMARK 4X12 TAN STRL LF (GAUZE/BANDAGES/DRESSINGS) ×2 IMPLANT
CHLORAPREP W/TINT 26 (MISCELLANEOUS) ×2 IMPLANT
DRSG GAUZE FLUFF 36X18 (GAUZE/BANDAGES/DRESSINGS) ×2 IMPLANT
ELECT REM PT RETURN 9FT ADLT (ELECTROSURGICAL) ×2
ELECTRODE REM PT RTRN 9FT ADLT (ELECTROSURGICAL) ×1 IMPLANT
GAUZE 4X4 16PLY ~~LOC~~+RFID DBL (SPONGE) ×2 IMPLANT
GAUZE SPONGE 4X4 12PLY STRL (GAUZE/BANDAGES/DRESSINGS) ×2 IMPLANT
GAUZE XEROFORM 1X8 LF (GAUZE/BANDAGES/DRESSINGS) ×2 IMPLANT
GAUZE XEROFORM 4X4 STRL (GAUZE/BANDAGES/DRESSINGS) ×1 IMPLANT
GLOVE SURG ORTHO LTX SZ8 (GLOVE) ×2 IMPLANT
GOWN STRL REUS W/ TWL LRG LVL3 (GOWN DISPOSABLE) ×1 IMPLANT
GOWN STRL REUS W/TWL LRG LVL3 (GOWN DISPOSABLE) ×2
GOWN STRL REUS W/TWL LRG LVL4 (GOWN DISPOSABLE) ×2 IMPLANT
KIT TURNOVER KIT A (KITS) ×1 IMPLANT
MANIFOLD NEPTUNE II (INSTRUMENTS) ×2 IMPLANT
NS IRRIG 500ML POUR BTL (IV SOLUTION) ×2 IMPLANT
PACK EXTREMITY ARMC (MISCELLANEOUS) ×2 IMPLANT
PAD CAST CTTN 4X4 STRL (SOFTGOODS) ×1 IMPLANT
PAD PREP 24X41 OB/GYN DISP (PERSONAL CARE ITEMS) ×2 IMPLANT
PADDING CAST ABS 4INX4YD NS (CAST SUPPLIES) ×1
PADDING CAST ABS COTTON 4X4 ST (CAST SUPPLIES) IMPLANT
PADDING CAST COTTON 4X4 STRL (SOFTGOODS) ×2
SPLINT CAST 1 STEP 3X12 (MISCELLANEOUS) ×2 IMPLANT
STOCKINETTE 48X4 2 PLY STRL (GAUZE/BANDAGES/DRESSINGS) ×1 IMPLANT
STOCKINETTE BIAS CUT 4 980044 (GAUZE/BANDAGES/DRESSINGS) ×2 IMPLANT
STOCKINETTE STRL 4IN 9604848 (GAUZE/BANDAGES/DRESSINGS) ×2 IMPLANT
STRAP SAFETY 5IN WIDE (MISCELLANEOUS) ×2 IMPLANT
SUT ETHILON 5-0 (SUTURE) ×2
SUT ETHILON 5-0 C-3 18XMFL BLK (SUTURE) ×1
SUT VIC AB 4-0 FS2 27 (SUTURE) ×2 IMPLANT
SUTURE ETHLN 5-0 C3 18XMF BLK (SUTURE) ×1 IMPLANT
WATER STERILE IRR 500ML POUR (IV SOLUTION) ×2 IMPLANT

## 2021-04-19 NOTE — Discharge Instructions (Signed)

## 2021-04-19 NOTE — Anesthesia Preprocedure Evaluation (Addendum)
Anesthesia Evaluation  Patient identified by MRN, date of birth, ID band Patient awake    Reviewed: Allergy & Precautions, NPO status , Patient's Chart, lab work & pertinent test results  History of Anesthesia Complications Negative for: history of anesthetic complications  Airway Mallampati: II   Neck ROM: Full    Dental no notable dental hx.    Pulmonary asthma ,    Pulmonary exam normal breath sounds clear to auscultation       Cardiovascular Exercise Tolerance: Good Normal cardiovascular exam Rhythm:Regular Rate:Normal  ECG 01/23/21: normal   Neuro/Psych  Headaches, PSYCHIATRIC DISORDERS (ADHD) Anxiety Depression Vertigo     GI/Hepatic negative GI ROS,   Endo/Other  Obesity   Renal/GU negative Renal ROS     Musculoskeletal Ehlers-Danlos syndrome   Abdominal   Peds  Hematology negative hematology ROS (+)   Anesthesia Other Findings Reviewed 12/22/20 cardiology note  Reproductive/Obstetrics                            Anesthesia Physical Anesthesia Plan  ASA: 2  Anesthesia Plan: General   Post-op Pain Management:    Induction: Intravenous  PONV Risk Score and Plan: 3 and Propofol infusion, TIVA and Treatment may vary due to age or medical condition  Airway Management Planned: Natural Airway  Additional Equipment:   Intra-op Plan:   Post-operative Plan:   Informed Consent: I have reviewed the patients History and Physical, chart, labs and discussed the procedure including the risks, benefits and alternatives for the proposed anesthesia with the patient or authorized representative who has indicated his/her understanding and acceptance.       Plan Discussed with: CRNA  Anesthesia Plan Comments: (LMA/GETA backup discussed.)        Anesthesia Quick Evaluation

## 2021-04-19 NOTE — H&P (Signed)
THE PATIENT WAS SEEN PRIOR TO SURGERY TODAY.  HISTORY, ALLERGIES, HOME MEDICATIONS AND OPERATIVE PROCEDURE WERE REVIEWED. RISKS AND BENEFITS OF SURGERY DISCUSSED WITH PATIENT AGAIN.  NO CHANGES FROM INITIAL HISTORY AND PHYSICAL NOTED.    

## 2021-04-19 NOTE — Op Note (Signed)
04/19/2021  9:55 AM  PATIENT:  Susan Adkins    PRE-OPERATIVE DIAGNOSIS:  T06.269 Dorsal ganglion, right wrist  POST-OPERATIVE DIAGNOSIS:  Same  PROCEDURE:  REMOVAL GANGLION OF DORSAL GANGLION RIGHT WRIST  SURGEON:  Valinda Hoar, MD  ANESTHESIA:   General  TOURNIQUET TIME: 33 MIN  PREOPERATIVE INDICATIONS:  [name] is a  30 y.o. female with a diagnosis of M67.431 Ganglion, right wrist who failed conservative measures and elected for surgical management.    The risks benefits and alternatives were discussed with the patient preoperatively including but not limited to the risks of infection, bleeding, nerve injury, cardiopulmonary complications, the need for revision surgery, among others, and the patient was willing to proceed.   OPERATIVE FINDINGS: Patient had a very large sessile type ganglion on the dorsum of the right wrist coming from the radiolunate region.    OPERATIVE PROCEDURE: The patient was brought to the operating room where they underwent satisfactory general LMA anesthesia.  The  arm was prepped and draped in sterile fashion.  Esmarch was applied and tourniquet inflated to 250 mmHg. .  A short transverse incision was made over the dorsal aspect of the  wrist.  Dissection was carried out carefully under loupe magnification with fine scissors.  The ganglion was identified deep to the superficial tissues and was carefully peeled off the capsule using  scissors and cautery.  It was followed down to its origin between the scaphoid and the lunate and was amputated.  A flap of tissue was then oversewn the defect using 4-0 Vicryl.  After thorough investigation that there was no further abnormal tissue, the wound was irrigated and subcutaneous tissue closed with 4-0 Vicryl and the skin with 5-0 nylon.Marland Kitchen  Sponge and needle counts were correct.  1/2%  Marcaine was placed in the wound.  A dry sterile compression hand dressing with volar splint was applied.  Tourniquet was  deflated with good return of blood flow to the hand.  The patient was awakened and taken to recovery in good condition.    Valinda Hoar, M.D.

## 2021-04-19 NOTE — Anesthesia Postprocedure Evaluation (Signed)
Anesthesia Post Note  Patient: Susan Adkins  Procedure(s) Performed: REMOVAL GANGLION OF WRIST (Right: Wrist)  Patient location during evaluation: PACU Anesthesia Type: General Level of consciousness: awake and alert Pain management: pain level controlled Vital Signs Assessment: post-procedure vital signs reviewed and stable Respiratory status: spontaneous breathing, nonlabored ventilation, respiratory function stable and patient connected to nasal cannula oxygen Cardiovascular status: blood pressure returned to baseline and stable Postop Assessment: no apparent nausea or vomiting Anesthetic complications: no   No notable events documented.   Last Vitals:  Vitals:   04/19/21 1115 04/19/21 1127  BP: 109/77 113/88  Pulse: 82 67  Resp: 16 18  Temp: (!) 36.3 C 36.6 C  SpO2: 99% 99%    Last Pain:  Vitals:   04/19/21 1127  TempSrc: Temporal  PainSc: 2                  Corinda Gubler

## 2021-04-19 NOTE — Transfer of Care (Signed)
Immediate Anesthesia Transfer of Care Note  Patient: Susan Adkins  Procedure(s) Performed: REMOVAL GANGLION OF WRIST (Right: Wrist)  Patient Location: PACU  Anesthesia Type:General  Level of Consciousness: awake  Airway & Oxygen Therapy: Patient Spontanous Breathing  Post-op Assessment: Report given to RN  Post vital signs: Reviewed and stable  Last Vitals:  Vitals Value Taken Time  BP 101/80 04/19/21 0956  Temp    Pulse 82 04/19/21 0957  Resp 18 04/19/21 0957  SpO2 100 % 04/19/21 0957  Vitals shown include unvalidated device data.  Last Pain: There were no vitals filed for this visit.       Complications: No notable events documented.

## 2021-04-19 NOTE — Anesthesia Procedure Notes (Signed)
Procedure Name: MAC Date/Time: 04/19/2021 8:50 AM Performed by: Biagio Borg, CRNA Pre-anesthesia Checklist: Patient identified, Emergency Drugs available, Suction available, Patient being monitored and Timeout performed Patient Re-evaluated:Patient Re-evaluated prior to induction Oxygen Delivery Method: Nasal cannula Induction Type: IV induction Placement Confirmation: positive ETCO2 and CO2 detector

## 2021-04-20 LAB — SURGICAL PATHOLOGY

## 2021-04-23 ENCOUNTER — Telehealth: Payer: Self-pay

## 2021-04-23 NOTE — Telephone Encounter (Signed)
Transition Care Management Unsuccessful Follow-up Telephone Call  Date of discharge and from where:  04/19/2021 / [facility]  Attempts:  1st Attempt  Reason for unsuccessful TCM follow-up call:  Left voice message  George Ina RN,BSN,CCM RN Case Manager [facility] [phone]

## 2021-04-23 NOTE — Telephone Encounter (Signed)
Transition Care Management Follow-up Telephone Call Date of discharge and from where: 04/19/2021 / [facility] How have you been since you were released from the hospital? " Very Good" Any questions or concerns? No  Items Reviewed: Did the pt receive and understand the discharge instructions provided? Yes  Medications obtained and verified? Yes  Other? No  Any new allergies since your discharge? No  Dietary orders reviewed? Yes Do you have support at home? Yes   Home Care and Equipment/Supplies: Were home health services ordered? not applicable If so, what is the name of the agency? N/a  Has the agency set up a time to come to the patient's home? not applicable Were any new equipment or medical supplies ordered?  No What is the name of the medical supply agency? N/a Were you able to get the supplies/equipment? not applicable Do you have any questions related to the use of the equipment or supplies? No  Functional Questionnaire: (I = Independent and D = Dependent) ADLs: I  Bathing/Dressing- I  Meal Prep- I  Eating- I  Maintaining continence- I  Transferring/Ambulation- I  Managing Meds- I  Follow up appointments reviewed:  PCP Hospital f/u appt confirmed? No  Scheduled to see  Specialist Hospital f/u appt confirmed? Yes  Scheduled to see Surgeon Dr. Hyacinth Meeker on 04/23/2021 @ 11:00am. Are transportation arrangements needed? Yes  If their condition worsens, is the pt aware to call PCP or go to the Emergency Dept.? Yes Was the patient provided with contact information for the PCP's office or ED? Yes Was to pt encouraged to call back with questions or concerns? Yes   George Ina RN,BSN,CCM RN Case Manager [facility]  [phone]

## 2021-04-24 ENCOUNTER — Other Ambulatory Visit: Payer: Self-pay | Admitting: Family

## 2021-04-24 DIAGNOSIS — F909 Attention-deficit hyperactivity disorder, unspecified type: Secondary | ICD-10-CM

## 2021-04-24 MED ORDER — LISDEXAMFETAMINE DIMESYLATE 30 MG PO CAPS: 30.0000 mg | ORAL_CAPSULE | ORAL | 0 refills | Status: DC

## 2021-04-24 NOTE — Telephone Encounter (Signed)
RX Refill: vyvanse Last Seen: 03-16-21 Last Ordered: 03-16-21 Next Appt: na

## 2021-05-08 ENCOUNTER — Other Ambulatory Visit: Payer: Self-pay | Admitting: Family

## 2021-05-08 DIAGNOSIS — S39012A Strain of muscle, fascia and tendon of lower back, initial encounter: Secondary | ICD-10-CM

## 2021-06-03 ENCOUNTER — Other Ambulatory Visit: Payer: Self-pay | Admitting: Family

## 2021-06-03 DIAGNOSIS — G43709 Chronic migraine without aura, not intractable, without status migrainosus: Secondary | ICD-10-CM

## 2021-06-03 DIAGNOSIS — F419 Anxiety disorder, unspecified: Secondary | ICD-10-CM

## 2021-06-12 ENCOUNTER — Encounter: Payer: Commercial Managed Care - PPO | Admitting: Certified Nurse Midwife

## 2021-06-25 ENCOUNTER — Encounter: Payer: Commercial Managed Care - PPO | Admitting: Certified Nurse Midwife

## 2021-07-02 ENCOUNTER — Other Ambulatory Visit: Payer: Self-pay

## 2021-07-02 ENCOUNTER — Ambulatory Visit: Payer: Commercial Managed Care - PPO | Admitting: Certified Nurse Midwife

## 2021-07-02 VITALS — BP 122/85 | HR 65 | Ht 62.0 in | Wt 168.0 lb

## 2021-07-02 DIAGNOSIS — N920 Excessive and frequent menstruation with regular cycle: Secondary | ICD-10-CM

## 2021-07-02 DIAGNOSIS — Z0000 Encounter for general adult medical examination without abnormal findings: Secondary | ICD-10-CM

## 2021-07-02 DIAGNOSIS — N924 Excessive bleeding in the premenopausal period: Secondary | ICD-10-CM

## 2021-07-02 DIAGNOSIS — N926 Irregular menstruation, unspecified: Secondary | ICD-10-CM | POA: Diagnosis not present

## 2021-07-02 MED ORDER — ORTHO-NOVUM 1/35 (28) 1-35 MG-MCG PO TABS: 1.0000 | ORAL_TABLET | Freq: Every day | ORAL | 11 refills | Status: DC
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
## 2021-07-03 ENCOUNTER — Encounter: Payer: Self-pay | Admitting: Family

## 2021-07-03 ENCOUNTER — Encounter: Payer: Self-pay | Admitting: Certified Nurse Midwife

## 2021-07-03 LAB — CBC
Hematocrit: 40.6 % (ref 34.0–46.6)
Hemoglobin: 13.9 g/dL (ref 11.1–15.9)
MCH: 30.8 pg (ref 26.6–33.0)
MCHC: 34.2 g/dL (ref 31.5–35.7)
MCV: 90 fL (ref 79–97)
Platelets: 308 10*3/uL (ref 150–450)
RBC: 4.51 x10E6/uL (ref 3.77–5.28)
RDW: 11.8 % (ref 11.7–15.4)
WBC: 6.2 10*3/uL (ref 3.4–10.8)

## 2021-07-03 LAB — TSH: TSH: 11.4 u[IU]/mL — ABNORMAL HIGH (ref 0.450–4.500)

## 2021-07-03 LAB — FERRITIN: Ferritin: 161 ng/mL — ABNORMAL HIGH (ref 15–150)

## 2021-07-04 ENCOUNTER — Other Ambulatory Visit: Payer: Self-pay | Admitting: Family

## 2021-07-04 MED ORDER — LEVOTHYROXINE SODIUM 125 MCG PO TABS: 125.0000 ug | ORAL_TABLET | Freq: Every day | ORAL | 3 refills | Status: DC

## 2021-07-05 ENCOUNTER — Other Ambulatory Visit: Payer: Commercial Managed Care - PPO

## 2021-07-06 ENCOUNTER — Other Ambulatory Visit: Payer: Self-pay | Admitting: Family

## 2021-07-06 DIAGNOSIS — S39012A Strain of muscle, fascia and tendon of lower back, initial encounter: Secondary | ICD-10-CM

## 2021-07-10 ENCOUNTER — Encounter: Payer: Self-pay | Admitting: Family

## 2021-07-10 NOTE — Addendum Note (Signed)
Addended by: Mechele Claude on: 07/10/2021 07:44 AM   Modules accepted: Orders

## 2021-08-02 ENCOUNTER — Other Ambulatory Visit (INDEPENDENT_AMBULATORY_CARE_PROVIDER_SITE_OTHER): Payer: Commercial Managed Care - PPO

## 2021-08-02 ENCOUNTER — Other Ambulatory Visit: Payer: Self-pay

## 2021-08-02 DIAGNOSIS — E063 Autoimmune thyroiditis: Secondary | ICD-10-CM | POA: Diagnosis not present

## 2021-08-02 LAB — TSH: TSH: 0.76 u[IU]/mL (ref 0.35–5.50)

## 2021-08-17 ENCOUNTER — Other Ambulatory Visit: Payer: Commercial Managed Care - PPO

## 2021-08-30 ENCOUNTER — Other Ambulatory Visit: Payer: Commercial Managed Care - PPO

## 2021-09-19 ENCOUNTER — Other Ambulatory Visit: Payer: Self-pay | Admitting: Family

## 2021-09-19 DIAGNOSIS — S39012A Strain of muscle, fascia and tendon of lower back, initial encounter: Secondary | ICD-10-CM

## 2021-11-09 ENCOUNTER — Encounter: Payer: Self-pay | Admitting: Family

## 2021-11-09 ENCOUNTER — Other Ambulatory Visit: Payer: Self-pay | Admitting: Family

## 2021-11-09 DIAGNOSIS — F909 Attention-deficit hyperactivity disorder, unspecified type: Secondary | ICD-10-CM

## 2021-11-09 MED ORDER — LISDEXAMFETAMINE DIMESYLATE 30 MG PO CAPS: 30.0000 mg | ORAL_CAPSULE | Freq: Every day | ORAL | 0 refills | Status: DC

## 2021-11-09 NOTE — Progress Notes (Signed)
I looked up patient on [place] Controlled Substances Reporting System PMP AWARE and saw no activity that raised concern of inappropriate use.   

## 2021-11-19 ENCOUNTER — Other Ambulatory Visit: Payer: Self-pay

## 2021-11-19 ENCOUNTER — Encounter: Payer: Self-pay | Admitting: Family

## 2021-11-19 MED ORDER — ESCITALOPRAM OXALATE 20 MG PO TABS: 20.0000 mg | ORAL_TABLET | Freq: Every day | ORAL | 0 refills | Status: DC

## 2021-11-26 ENCOUNTER — Encounter: Payer: Self-pay | Admitting: Family

## 2021-11-26 ENCOUNTER — Other Ambulatory Visit: Payer: Self-pay | Admitting: Family

## 2021-11-26 ENCOUNTER — Ambulatory Visit (INDEPENDENT_AMBULATORY_CARE_PROVIDER_SITE_OTHER): Payer: Commercial Managed Care - PPO | Admitting: Family

## 2021-11-26 VITALS — BP 122/72 | HR 78 | Temp 98.0°F | Ht 62.0 in | Wt 176.0 lb

## 2021-11-26 DIAGNOSIS — F909 Attention-deficit hyperactivity disorder, unspecified type: Secondary | ICD-10-CM | POA: Diagnosis not present

## 2021-11-26 DIAGNOSIS — F419 Anxiety disorder, unspecified: Secondary | ICD-10-CM | POA: Diagnosis not present

## 2021-11-26 DIAGNOSIS — F32A Depression, unspecified: Secondary | ICD-10-CM | POA: Diagnosis not present

## 2021-11-26 DIAGNOSIS — Z0000 Encounter for general adult medical examination without abnormal findings: Secondary | ICD-10-CM

## 2021-11-26 DIAGNOSIS — S39012A Strain of muscle, fascia and tendon of lower back, initial encounter: Secondary | ICD-10-CM

## 2021-11-26 MED ORDER — LISDEXAMFETAMINE DIMESYLATE 40 MG PO CAPS: 40.0000 mg | ORAL_CAPSULE | ORAL | 0 refills | Status: DC
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
## 2021-11-26 NOTE — Assessment & Plan Note (Signed)
Overall stable. She declines changing regimen and feels overall controlled. We agreed to continue trazodone 50mg , lexapro 20mg 

## 2021-11-26 NOTE — Assessment & Plan Note (Signed)
Suboptimal control. Agreed to increase vyvanse to 40mg . I looked up patient on [place] Controlled Substances Reporting System PMP AWARE and saw no activity that raised concern of inappropriate use.  She will let me know how she is doing

## 2021-12-17 ENCOUNTER — Encounter: Payer: Self-pay | Admitting: Family

## 2021-12-17 ENCOUNTER — Other Ambulatory Visit: Payer: Self-pay | Admitting: Family

## 2021-12-17 MED ORDER — LISDEXAMFETAMINE DIMESYLATE 50 MG PO CAPS: 50.0000 mg | ORAL_CAPSULE | Freq: Every day | ORAL | 0 refills | Status: DC

## 2021-12-17 NOTE — Telephone Encounter (Signed)
Spoke to patient to inform her that the 50 mg  Vyvanse had been sent in to the pharmacy. Patient stated that she would call us to schedule appt after she sees how the new change affects her in about 3 wks.

## 2022-01-09 ENCOUNTER — Encounter: Payer: Self-pay | Admitting: Family

## 2022-01-14 ENCOUNTER — Other Ambulatory Visit: Payer: Self-pay | Admitting: Family

## 2022-01-14 ENCOUNTER — Encounter: Payer: Self-pay | Admitting: Family

## 2022-01-14 MED ORDER — LISDEXAMFETAMINE DIMESYLATE 50 MG PO CAPS: 50.0000 mg | ORAL_CAPSULE | Freq: Every day | ORAL | 0 refills | Status: DC

## 2022-01-29 ENCOUNTER — Encounter: Payer: Self-pay | Admitting: Family

## 2022-01-30 ENCOUNTER — Other Ambulatory Visit: Payer: Self-pay

## 2022-01-30 DIAGNOSIS — S39012A Strain of muscle, fascia and tendon of lower back, initial encounter: Secondary | ICD-10-CM

## 2022-01-30 MED ORDER — GABAPENTIN 300 MG PO CAPS: 300.0000 mg | ORAL_CAPSULE | Freq: Two times a day (BID) | ORAL | 1 refills | Status: DC

## 2022-02-13 ENCOUNTER — Other Ambulatory Visit: Payer: Self-pay | Admitting: Family

## 2022-02-14 ENCOUNTER — Encounter: Payer: Self-pay | Admitting: Family

## 2022-02-14 ENCOUNTER — Other Ambulatory Visit: Payer: Self-pay | Admitting: Family

## 2022-02-14 ENCOUNTER — Telehealth: Payer: Self-pay | Admitting: Family

## 2022-02-14 DIAGNOSIS — G43709 Chronic migraine without aura, not intractable, without status migrainosus: Secondary | ICD-10-CM

## 2022-02-14 NOTE — Telephone Encounter (Signed)
MyChart message:  February 14, 2022 Hughes Better [place] to [facility] (supporting Allegra Grana, FNP)       02/14/22  9:38 AM Hey guys! I tried refilling my Vyvanse at [facility] today and they said I needed to reach out for a new prescription to be sent in. I only have 2 days left. Can you please send in a refill for 50mg  and let me know when you have? Unfortunately [facility] never lets me know. Thanks!

## 2022-02-15 ENCOUNTER — Other Ambulatory Visit: Payer: Self-pay | Admitting: Family

## 2022-02-15 ENCOUNTER — Encounter: Payer: Self-pay | Admitting: Family

## 2022-02-15 DIAGNOSIS — F909 Attention-deficit hyperactivity disorder, unspecified type: Secondary | ICD-10-CM

## 2022-02-15 MED ORDER — LISDEXAMFETAMINE DIMESYLATE 50 MG PO CAPS: 50.0000 mg | ORAL_CAPSULE | Freq: Every day | ORAL | 0 refills | Status: DC

## 2022-02-15 NOTE — Progress Notes (Signed)
I looked up patient on [place] Controlled Substances Reporting System PMP AWARE and saw no activity that raised concern of inappropriate use.   

## 2022-02-22 ENCOUNTER — Other Ambulatory Visit (HOSPITAL_COMMUNITY)
Admission: RE | Admit: 2022-02-22 | Discharge: 2022-02-22 | Disposition: A | Discharge status: Home / Self Care | Payer: Commercial Managed Care - PPO | Source: Ambulatory Visit | Attending: Family | Admitting: Family

## 2022-02-22 ENCOUNTER — Encounter: Payer: Self-pay | Admitting: Family

## 2022-02-22 ENCOUNTER — Ambulatory Visit (INDEPENDENT_AMBULATORY_CARE_PROVIDER_SITE_OTHER): Payer: Commercial Managed Care - PPO | Admitting: Family

## 2022-02-22 VITALS — BP 118/70 | HR 81 | Temp 98.8°F | Ht 62.0 in | Wt 173.8 lb

## 2022-02-22 DIAGNOSIS — Z0001 Encounter for general adult medical examination with abnormal findings: Secondary | ICD-10-CM

## 2022-02-22 DIAGNOSIS — M549 Dorsalgia, unspecified: Secondary | ICD-10-CM

## 2022-02-22 DIAGNOSIS — Z0000 Encounter for general adult medical examination without abnormal findings: Secondary | ICD-10-CM

## 2022-02-22 DIAGNOSIS — N946 Dysmenorrhea, unspecified: Secondary | ICD-10-CM | POA: Diagnosis not present

## 2022-02-22 LAB — CBC WITH DIFFERENTIAL/PLATELET
Basophils Absolute: 0 10*3/uL (ref 0.0–0.1)
Basophils Relative: 0.5 % (ref 0.0–3.0)
Eosinophils Absolute: 0.1 10*3/uL (ref 0.0–0.7)
Eosinophils Relative: 1.1 % (ref 0.0–5.0)
HCT: 41.6 % (ref 36.0–46.0)
Hemoglobin: 14 g/dL (ref 12.0–15.0)
Lymphocytes Relative: 28.5 % (ref 12.0–46.0)
Lymphs Abs: 1.4 10*3/uL (ref 0.7–4.0)
MCHC: 33.5 g/dL (ref 30.0–36.0)
MCV: 91.8 fl (ref 78.0–100.0)
Monocytes Absolute: 0.3 10*3/uL (ref 0.1–1.0)
Monocytes Relative: 5.9 % (ref 3.0–12.0)
Neutro Abs: 3.1 10*3/uL (ref 1.4–7.7)
Neutrophils Relative %: 64 % (ref 43.0–77.0)
Platelets: 282 10*3/uL (ref 150.0–400.0)
RBC: 4.53 Mil/uL (ref 3.87–5.11)
RDW: 11.8 % (ref 11.5–15.5)
WBC: 4.8 10*3/uL (ref 4.0–10.5)

## 2022-02-22 LAB — COMPREHENSIVE METABOLIC PANEL
ALT: 22 U/L (ref 0–35)
AST: 17 U/L (ref 0–37)
Albumin: 3.8 g/dL (ref 3.5–5.2)
Alkaline Phosphatase: 103 U/L (ref 39–117)
BUN: 12 mg/dL (ref 6–23)
CO2: 26 mEq/L (ref 19–32)
Calcium: 9 mg/dL (ref 8.4–10.5)
Chloride: 103 mEq/L (ref 96–112)
Creatinine, Ser: 0.95 mg/dL (ref 0.40–1.20)
GFR: 80.4 mL/min (ref >60.00)
Glucose, Bld: 92 mg/dL (ref 70–99)
Potassium: 5 mEq/L (ref 3.5–5.1)
Sodium: 137 mEq/L (ref 135–145)
Total Bilirubin: 0.3 mg/dL (ref 0.2–1.2)
Total Protein: 6.8 g/dL (ref 6.0–8.3)

## 2022-02-22 LAB — IBC + FERRITIN
Ferritin: 50.6 ng/mL (ref 10.0–291.0)
Iron: 90 ug/dL (ref 42–145)
Saturation Ratios: 23.9 % (ref 20.0–50.0)
TIBC: 376.6 ug/dL (ref 250.0–450.0)
Transferrin: 269 mg/dL (ref 212.0–360.0)

## 2022-02-22 LAB — B12 AND FOLATE PANEL
Folate: 14.4 ng/mL (ref >5.9)
Vitamin B-12: 187 pg/mL — ABNORMAL LOW (ref 211–911)

## 2022-02-22 LAB — LIPID PANEL
Cholesterol: 190 mg/dL (ref 0–200)
HDL: 60 mg/dL (ref >39.00)
LDL Cholesterol: 115 mg/dL — ABNORMAL HIGH (ref 0–99)
NonHDL: 129.7
Total CHOL/HDL Ratio: 3
Triglycerides: 76 mg/dL (ref 0.0–149.0)
VLDL: 15.2 mg/dL (ref 0.0–40.0)

## 2022-02-22 LAB — VITAMIN D 25 HYDROXY (VIT D DEFICIENCY, FRACTURES): VITD: 32.47 ng/mL (ref 30.00–100.00)

## 2022-02-22 LAB — HEMOGLOBIN A1C: Hgb A1c MFr Bld: 5.6 % (ref 4.6–6.5)

## 2022-02-22 LAB — TSH: TSH: 0.03 u[IU]/mL — ABNORMAL LOW (ref 0.35–5.50)

## 2022-02-22 NOTE — Assessment & Plan Note (Addendum)
Mother has family history of rectal cancer at 30 years of age.  I sent a message to hematologist /oncologist Dr. Cathie Hoops in regards to recommendations for earlier screening for colorectal cancer for patient today.  Clinical breast exam performed.  Pap performed with HPV screening.

## 2022-02-22 NOTE — Patient Instructions (Addendum)
I have sent a staff message Dr. Cathie Hoops in regards to earlier screening for colorectal cancer.  I will let you know when she returns my message.  Please not hesitate to reach out to me next week if you have not heard from me in this regard  Health Maintenance, Female Adopting a healthy lifestyle and getting preventive care are important in promoting health and wellness. Ask your health care provider about: The right schedule for you to have regular tests and exams. Things you can do on your own to prevent diseases and keep yourself healthy. What should I know about diet, weight, and exercise? Eat a healthy diet  Eat a diet that includes plenty of vegetables, fruits, low-fat dairy products, and lean protein. Do not eat a lot of foods that are high in solid fats, added sugars, or sodium. Maintain a healthy weight Body mass index (BMI) is used to identify weight problems. It estimates body fat based on height and weight. Your health care provider can help determine your BMI and help you achieve or maintain a healthy weight. Get regular exercise Get regular exercise. This is one of the most important things you can do for your health. Most adults should: Exercise for at least 150 minutes each week. The exercise should increase your heart rate and make you sweat (moderate-intensity exercise). Do strengthening exercises at least twice a week. This is in addition to the moderate-intensity exercise. Spend less time sitting. Even light physical activity can be beneficial. Watch cholesterol and blood lipids Have your blood tested for lipids and cholesterol at 30 years of age, then have this test every 5 years. Have your cholesterol levels checked more often if: Your lipid or cholesterol levels are high. You are older than 30 years of age. You are at high risk for heart disease. What should I know about cancer screening? Depending on your health history and family history, you may need to have cancer  screening at various ages. This may include screening for: Breast cancer. Cervical cancer. Colorectal cancer. Skin cancer. Lung cancer. What should I know about heart disease, diabetes, and high blood pressure? Blood pressure and heart disease High blood pressure causes heart disease and increases the risk of stroke. This is more likely to develop in people who have high blood pressure readings or are overweight. Have your blood pressure checked: Every 3-5 years if you are 34-68 years of age. Every year if you are 53 years old or older. Diabetes Have regular diabetes screenings. This checks your fasting blood sugar level. Have the screening done: Once every three years after age 30 if you are at a normal weight and have a low risk for diabetes. More often and at a younger age if you are overweight or have a high risk for diabetes. What should I know about preventing infection? Hepatitis B If you have a higher risk for hepatitis B, you should be screened for this virus. Talk with your health care provider to find out if you are at risk for hepatitis B infection. Hepatitis C Testing is recommended for: Everyone born from [birth year] through 1964. Anyone with known risk factors for hepatitis C. Sexually transmitted infections (STIs) Get screened for STIs, including gonorrhea and chlamydia, if: You are sexually active and are younger than 30 years of age. You are older than 30 years of age and your health care provider tells you that you are at risk for this type of infection. Your sexual activity has changed since you were  last screened, and you are at increased risk for chlamydia or gonorrhea. Ask your health care provider if you are at risk. Ask your health care provider about whether you are at high risk for HIV. Your health care provider may recommend a prescription medicine to help prevent HIV infection. If you choose to take medicine to prevent HIV, you should first get tested for HIV. You  should then be tested every 3 months for as long as you are taking the medicine. Pregnancy If you are about to stop having your period (premenopausal) and you may become pregnant, seek counseling before you get pregnant. Take 400 to 800 micrograms (mcg) of folic acid every day if you become pregnant. Ask for birth control (contraception) if you want to prevent pregnancy. Osteoporosis and menopause Osteoporosis is a disease in which the bones lose minerals and strength with aging. This can result in bone fractures. If you are 19 years old or older, or if you are at risk for osteoporosis and fractures, ask your health care provider if you should: Be screened for bone loss. Take a calcium or vitamin D supplement to lower your risk of fractures. Be given hormone replacement therapy (HRT) to treat symptoms of menopause. Follow these instructions at home: Alcohol use Do not drink alcohol if: Your health care provider tells you not to drink. You are pregnant, may be pregnant, or are planning to become pregnant. If you drink alcohol: Limit how much you have to: 0-1 drink a day. Know how much alcohol is in your drink. In the [place] one drink equals one 12 oz bottle of beer (355 mL), one 5 oz glass of wine (148 mL), or one 1 oz glass of hard liquor (44 mL). Lifestyle Do not use any products that contain nicotine or tobacco. These products include cigarettes, chewing tobacco, and vaping devices, such as e-cigarettes. If you need help quitting, ask your health care provider. Do not use street drugs. Do not share needles. Ask your health care provider for help if you need support or information about quitting drugs. General instructions Schedule regular health, dental, and eye exams. Stay current with your vaccines. Tell your health care provider if: You often feel depressed. You have ever been abused or do not feel safe at home. Summary Adopting a healthy lifestyle and getting preventive care are  important in promoting health and wellness. Follow your health care provider's instructions about healthy diet, exercising, and getting tested or screened for diseases. Follow your health care provider's instructions on monitoring your cholesterol and blood pressure. This information is not intended to replace advice given to you by your health care provider. Make sure you discuss any questions you have with your health care provider. Document Revised: 10/23/2020 Document Reviewed: 10/23/2020 Elsevier Patient Education  [place].
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
## 2022-02-25 ENCOUNTER — Ambulatory Visit (INDEPENDENT_AMBULATORY_CARE_PROVIDER_SITE_OTHER): Payer: Commercial Managed Care - PPO

## 2022-02-25 ENCOUNTER — Other Ambulatory Visit: Payer: Self-pay | Admitting: Family

## 2022-02-25 ENCOUNTER — Other Ambulatory Visit (INDEPENDENT_AMBULATORY_CARE_PROVIDER_SITE_OTHER): Payer: Commercial Managed Care - PPO

## 2022-02-25 ENCOUNTER — Encounter: Payer: Self-pay | Admitting: Family

## 2022-02-25 DIAGNOSIS — E538 Deficiency of other specified B group vitamins: Secondary | ICD-10-CM

## 2022-02-25 DIAGNOSIS — E069 Thyroiditis, unspecified: Secondary | ICD-10-CM

## 2022-02-25 LAB — CERVICOVAGINAL ANCILLARY ONLY
Bacterial Vaginitis (gardnerella): NEGATIVE
Candida Glabrata: NEGATIVE
Candida Vaginitis: NEGATIVE
Chlamydia: NEGATIVE
Comment: NEGATIVE
Comment: NEGATIVE
Comment: NEGATIVE
Comment: NEGATIVE
Comment: NEGATIVE
Comment: NORMAL
Neisseria Gonorrhea: NEGATIVE
Trichomonas: NEGATIVE

## 2022-02-25 MED ORDER — CYANOCOBALAMIN 1000 MCG/ML IJ SOLN
1000.0000 ug | Freq: Once | INTRAMUSCULAR | Status: AC
  Administered 2022-02-25: 1000 ug via INTRAMUSCULAR

## 2022-02-25 NOTE — Progress Notes (Signed)
Patient presented for B 12 injection to right deltoid, patient voiced no concerns nor showed any signs of distress during injection. 

## 2022-02-27 LAB — CELIAC DISEASE AB SCREEN W/RFX
Antigliadin Abs, IgA: 3 units (ref 0–19)
IgA/Immunoglobulin A, Serum: 189 mg/dL (ref 87–352)
Transglutaminase IgA: <2 U/mL (ref 0–3)

## 2022-02-27 NOTE — Assessment & Plan Note (Addendum)
Chronic, poor control.  We jointly agreed return to follow-up with Dr. Katrinka Blazing is most appropriate for further evaluation, advice as it relates to home exercises.  patient is scheduled to see him 03/27/2022. Will follow.

## 2022-02-27 NOTE — Assessment & Plan Note (Signed)
Reviewed CNM note from January of this year.  Pending TSH, pelvic ultrasound.  Patient will continue oral combined contraceptive for now. May discuss changing to IUD. Could consider dosing magnesium citrate for menstrual migraine as prevention. Close follow up.

## 2022-02-28 LAB — HOMOCYSTEINE: Homocysteine: 10.3 umol/L (ref <10.4)

## 2022-02-28 LAB — CYTOLOGY - PAP
Chlamydia: NEGATIVE
Comment: NEGATIVE
Comment: NEGATIVE
Comment: NEGATIVE
Comment: NORMAL
Diagnosis: NEGATIVE
High risk HPV: NEGATIVE
Neisseria Gonorrhea: NEGATIVE
Trichomonas: NEGATIVE

## 2022-02-28 LAB — INTRINSIC FACTOR ANTIBODIES: Intrinsic Factor: NEGATIVE

## 2022-02-28 LAB — METHYLMALONIC ACID, SERUM: Methylmalonic Acid, Quant: 1230 nmol/L — ABNORMAL HIGH (ref 87–318)

## 2022-03-04 ENCOUNTER — Encounter: Payer: Self-pay | Admitting: Family

## 2022-03-04 ENCOUNTER — Ambulatory Visit (INDEPENDENT_AMBULATORY_CARE_PROVIDER_SITE_OTHER): Payer: Commercial Managed Care - PPO | Admitting: *Deleted

## 2022-03-04 ENCOUNTER — Ambulatory Visit
Admission: RE | Admit: 2022-03-04 | Discharge: 2022-03-04 | Disposition: A | Discharge status: Home / Self Care | Payer: Commercial Managed Care - PPO | Source: Ambulatory Visit | Attending: Family | Admitting: Family

## 2022-03-04 DIAGNOSIS — N946 Dysmenorrhea, unspecified: Secondary | ICD-10-CM | POA: Insufficient documentation

## 2022-03-04 DIAGNOSIS — E538 Deficiency of other specified B group vitamins: Secondary | ICD-10-CM | POA: Diagnosis not present

## 2022-03-04 MED ORDER — CYANOCOBALAMIN 1000 MCG/ML IJ SOLN
1000.0000 ug | Freq: Once | INTRAMUSCULAR | Status: AC
  Administered 2022-03-04: 1000 ug via INTRAMUSCULAR

## 2022-03-04 NOTE — Progress Notes (Signed)
Pt received B12 injection in left deltoid & tolerated it well with no issues or complaints. 

## 2022-03-11 ENCOUNTER — Ambulatory Visit (INDEPENDENT_AMBULATORY_CARE_PROVIDER_SITE_OTHER): Payer: Commercial Managed Care - PPO

## 2022-03-11 DIAGNOSIS — E538 Deficiency of other specified B group vitamins: Secondary | ICD-10-CM | POA: Diagnosis not present

## 2022-03-11 MED ORDER — CYANOCOBALAMIN 1000 MCG/ML IJ SOLN
1000.0000 ug | Freq: Once | INTRAMUSCULAR | Status: AC
  Administered 2022-03-11: 1000 ug via INTRAMUSCULAR

## 2022-03-11 NOTE — Progress Notes (Signed)
Pt arrived for B12 injection, given in R deltoid. Pt tolerated injection well, showed no signs of distress nor voiced any concerns.  

## 2022-03-15 ENCOUNTER — Encounter: Payer: Commercial Managed Care - PPO | Admitting: Family

## 2022-03-18 ENCOUNTER — Telehealth: Payer: Self-pay | Admitting: Family

## 2022-03-18 ENCOUNTER — Ambulatory Visit: Payer: Commercial Managed Care - PPO

## 2022-03-18 DIAGNOSIS — F909 Attention-deficit hyperactivity disorder, unspecified type: Secondary | ICD-10-CM

## 2022-03-18 NOTE — Telephone Encounter (Signed)
Spoke to patient and she actually needs a PA for the Vyvanse. Told pt that I would start the PA and they would notify her and myself as sson as they make a decision?

## 2022-03-18 NOTE — Telephone Encounter (Signed)
Pt need a refill on VYVANSE sent to [facility]

## 2022-03-18 NOTE — Telephone Encounter (Signed)
Call pt Susan Adkins has two [facility] at [facility] for oct and nov  Please ask her to always call pharmacy first as they hold onto refills. I have to send 3 scripts at a time without a refill - however it is a 90 day supply

## 2022-03-18 NOTE — Telephone Encounter (Signed)
Noted Please complete PA

## 2022-03-19 NOTE — Telephone Encounter (Signed)
PA for: Lisdexamifetamine Dimesylate 50mg  has been approved by insurance 03/19/2022-03/18/2025  Pt called left message that medication had been approved.

## 2022-03-19 NOTE — Telephone Encounter (Signed)
Phone Call to pt to notify her that PA was completed and we are waiting to hear back from insurance company.    Susan Adkins  Key: [id number] Rx #: [id number] Need help? Call us at [phone] Status: Sent to [facility] Drug: Lisdexamfetamine Dimesylate 50MG  capsules Form: Charity fundraiser PA Form [id number] NCPDP) Original Claim Info [id number] [place] REQ-MD CALL [phone].MAXIMUM PATIENT AGE OF 18 YEARS [address] [place] HELP DESK [phone])

## 2022-03-21 ENCOUNTER — Ambulatory Visit: Payer: Commercial Managed Care - PPO
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
## 2022-03-22 ENCOUNTER — Ambulatory Visit (INDEPENDENT_AMBULATORY_CARE_PROVIDER_SITE_OTHER): Payer: Commercial Managed Care - PPO

## 2022-03-22 ENCOUNTER — Ambulatory Visit: Payer: Commercial Managed Care - PPO | Admitting: Family Medicine

## 2022-03-22 DIAGNOSIS — E538 Deficiency of other specified B group vitamins: Secondary | ICD-10-CM

## 2022-03-22 MED ORDER — CYANOCOBALAMIN 1000 MCG/ML IJ SOLN
1000.0000 ug | Freq: Once | INTRAMUSCULAR | Status: AC
  Administered 2022-03-22: 1000 ug via INTRAMUSCULAR

## 2022-03-22 NOTE — Progress Notes (Addendum)
Patient arrived for her B12 injection Patient given B12 in left deltoid Patient tolerated B12 injection well no signs of distress nor voiced any concerns   Agree with plan. [name], NP

## 2022-03-27 ENCOUNTER — Other Ambulatory Visit: Payer: Self-pay | Admitting: Family

## 2022-03-27 DIAGNOSIS — S39012A Strain of muscle, fascia and tendon of lower back, initial encounter: Secondary | ICD-10-CM

## 2022-04-02 ENCOUNTER — Ambulatory Visit: Payer: Commercial Managed Care - PPO | Admitting: Family Medicine

## 2022-04-08 ENCOUNTER — Ambulatory Visit: Payer: Commercial Managed Care - PPO | Admitting: Family

## 2022-04-08 ENCOUNTER — Other Ambulatory Visit: Payer: Commercial Managed Care - PPO

## 2022-04-10 ENCOUNTER — Other Ambulatory Visit: Payer: Commercial Managed Care - PPO

## 2022-04-22 ENCOUNTER — Telehealth: Payer: Self-pay | Admitting: Family

## 2022-04-22 ENCOUNTER — Ambulatory Visit: Payer: Commercial Managed Care - PPO

## 2022-04-22 DIAGNOSIS — M549 Dorsalgia, unspecified: Secondary | ICD-10-CM

## 2022-04-22 NOTE — Telephone Encounter (Signed)
The patient will prefer to do her own B-12. She has requested that her B-12 be called into the pharmacy.

## 2022-04-23 MED ORDER — CYANOCOBALAMIN 1000 MCG/ML IJ SOLN: INTRAMUSCULAR | 4 refills | Status: DC

## 2022-04-23 NOTE — Telephone Encounter (Signed)
LVM to call back to inform her that she needs to    [place] visit for teaching and she can administer her first IM with a nurse. She needs to bring her medication

## 2022-04-23 NOTE — Telephone Encounter (Signed)
Call pt  Sch rn visit for teaching and she can administer her first IM with a rn. She needs to bring her medication

## 2022-05-13 ENCOUNTER — Other Ambulatory Visit: Payer: Self-pay | Admitting: Family

## 2022-05-16 ENCOUNTER — Other Ambulatory Visit: Payer: Self-pay | Admitting: Family

## 2022-05-16 MED ORDER — ESCITALOPRAM OXALATE 20 MG PO TABS: 20.0000 mg | ORAL_TABLET | Freq: Every day | ORAL | 0 refills | Status: DC

## 2022-05-21 ENCOUNTER — Encounter: Payer: Self-pay | Admitting: Family

## 2022-05-23 ENCOUNTER — Other Ambulatory Visit: Payer: Self-pay | Admitting: Family

## 2022-05-23 DIAGNOSIS — S39012A Strain of muscle, fascia and tendon of lower back, initial encounter: Secondary | ICD-10-CM

## 2022-05-24 ENCOUNTER — Other Ambulatory Visit: Payer: Self-pay | Admitting: Family

## 2022-05-24 DIAGNOSIS — F909 Attention-deficit hyperactivity disorder, unspecified type: Secondary | ICD-10-CM

## 2022-05-24 MED ORDER — LISDEXAMFETAMINE DIMESYLATE 40 MG PO CAPS: 40.0000 mg | ORAL_CAPSULE | ORAL | 0 refills | Status: DC

## 2022-05-24 NOTE — Progress Notes (Signed)
I looked up patient on [place] Controlled Substances Reporting System PMP AWARE and saw no activity that raised concern of inappropriate use.   

## 2022-06-03 ENCOUNTER — Other Ambulatory Visit: Payer: Self-pay | Admitting: Family

## 2022-06-04 MED ORDER — ESCITALOPRAM OXALATE 20 MG PO TABS: 20.0000 mg | ORAL_TABLET | Freq: Every day | ORAL | 0 refills | Status: DC

## 2022-06-14 ENCOUNTER — Other Ambulatory Visit: Payer: Self-pay | Admitting: Family

## 2022-06-19 ENCOUNTER — Encounter: Payer: Self-pay | Admitting: Family

## 2022-06-20 ENCOUNTER — Other Ambulatory Visit: Payer: Self-pay

## 2022-06-20 MED ORDER — LEVOTHYROXINE SODIUM 125 MCG PO TABS: 125.0000 ug | ORAL_TABLET | Freq: Every day | ORAL | 3 refills | Status: DC

## 2022-07-29 ENCOUNTER — Encounter: Payer: Self-pay | Admitting: Family

## 2022-07-29 ENCOUNTER — Ambulatory Visit: Payer: Commercial Managed Care - PPO | Admitting: Family

## 2022-07-29 ENCOUNTER — Other Ambulatory Visit: Payer: Self-pay | Admitting: Family

## 2022-07-29 VITALS — BP 128/82 | HR 86 | Temp 98.6°F | Ht 62.0 in | Wt 181.0 lb

## 2022-07-29 DIAGNOSIS — E063 Autoimmune thyroiditis: Secondary | ICD-10-CM | POA: Diagnosis not present

## 2022-07-29 DIAGNOSIS — U071 COVID-19: Secondary | ICD-10-CM

## 2022-07-29 DIAGNOSIS — F419 Anxiety disorder, unspecified: Secondary | ICD-10-CM

## 2022-07-29 DIAGNOSIS — J029 Acute pharyngitis, unspecified: Secondary | ICD-10-CM

## 2022-07-29 DIAGNOSIS — E538 Deficiency of other specified B group vitamins: Secondary | ICD-10-CM | POA: Diagnosis not present

## 2022-07-29 DIAGNOSIS — Z8489 Family history of other specified conditions: Secondary | ICD-10-CM

## 2022-07-29 DIAGNOSIS — F32A Depression, unspecified: Secondary | ICD-10-CM

## 2022-07-29 HISTORY — DX: Family history of other specified conditions: Z84.89

## 2022-07-29 LAB — POCT INFLUENZA A/B
Influenza A, POC: NEGATIVE
Influenza B, POC: NEGATIVE

## 2022-07-29 LAB — POC COVID19 BINAXNOW: SARS Coronavirus 2 Ag: POSITIVE — AB

## 2022-07-29 LAB — POCT RAPID STREP A (OFFICE): Rapid Strep A Screen: NEGATIVE

## 2022-07-29 MED ORDER — TRAZODONE HCL 100 MG PO TABS: 100.0000 mg | ORAL_TABLET | Freq: Every day | ORAL | 3 refills | Status: DC

## 2022-07-29 MED ORDER — NIRMATRELVIR/RITONAVIR (PAXLOVID)TABLET: 3.0000 | ORAL_TABLET | Freq: Two times a day (BID) | ORAL | 0 refills | Status: AC

## 2022-07-29 MED ORDER — GUAIFENESIN-CODEINE 100-10 MG/5ML PO SYRP: 5.0000 mL | ORAL_SOLUTION | Freq: Every evening | ORAL | 0 refills | Status: DC | PRN

## 2022-07-29 NOTE — Patient Instructions (Signed)
Start cheratussin cough syrup with codeine .  Please take cough medication at night only as needed. As we discussed, I do not recommend dosing throughout the day as coughing is a protective mechanism . It also helps to break up thick mucous.  Do not take cough suppressants with alcohol as can lead to trouble breathing. Advise caution if taking cough suppressant and operating machinery ( i.e driving a car) as you may feel very tired.    Please stay in quarantine per cdc guidelines.   If you test positive for COVID-19, stay home for at least 5 days and isolate from others in your home. You are likely most infectious during these first 5 days. Wear a high-quality mask if you must be around others at home and in public. Do not go places where you are unable to wear a mask.  We discussed starting Paxlovid which is an unapproved drug that is authorized for use under an Emergency Use Authorization.  There are no adequate, approved, available products for the treatment of COVID-19 in adults who have mild-to-moderate COVID-19 and are at high risk for progressing to severe COVID-19, including hospitalization or death.  There are benefits and risks of taking this treatment as outlined in the "Fact Sheet for Patients and Caregivers." You may find this document here and please read in detail   [email or web address]   I have sent Paxlovid to your pharmacy. Please call pharmacy so they bring medication out to your car and you do not have to go inside.   PAXLOVID ADMINISTRATION INSTRUCTIONS:  Take with or without food. Swallow the tablets whole. Don't chew, crush, or break the medications because it might not work as well  For each dose of the medication, you should be taking 3 tablets together (2 pink oval and 1 white oval) TWICE a day for FIVE days   Finish your full five-day course of Paxlovid even if you feel better before you're done. Stopping this medication too early can make it  less effective to prevent severe illness related to [place].    Paxlovid is prescribed for YOU ONLY. Don't share it with others, even if they have similar symptoms as you. This medication might not be right for everyone.  Make sure to take steps to protect yourself and others while you're taking this medication in order to get well soon and to prevent others from getting sick with COVID-19.  Paxlovid (nirmatrelvir / ritonavir) can cause hormonal birth control medications to not work well. If you or your partner is currently taking hormonal birth control, use condoms or other birth control methods to prevent unintended pregnancies.   COMMON SIDE EFFECTS: Altered or bad taste in your mouth  Diarrhea  High blood pressure (1% of people) Muscle aches (1% of people)    If your COVID-19 symptoms get worse, get medical help right away. Call 911 if you experience symptoms such as worsening cough, trouble breathing, chest pain that doesn't go away, confusion, a hard time staying awake, and pale or blue-colored skin.This medication won't prevent all COVID-19 cases from getting worse.

## 2022-07-29 NOTE — Assessment & Plan Note (Signed)
Mother has h/o squamous cell anal cancer age 31.  We discussed colonoscopy at age 53.

## 2022-07-29 NOTE — Progress Notes (Signed)
Assessment & Plan:  COVID-19 Assessment & Plan: Afebrile on antipyretic.  No respiratory distress.  Counseled on lacking long term safely and effectiveness data of medication, Paxlovid. Explained EUA for Paxlovid. Criteria met for consideration of Paxlovid,  patient older than 12 years and weight > 40kg, started within 5 days of symptom onset. Counseled on drug interaction between trazodone, OCP, and Paxlovid Counseled on adverse effects including altered taste, diarrhea, HTN, and myalgia.   Patient is most comfortable and desires to start Paxlovid and understands to call me with concerns or new symptoms.  Provided with Cheratussin cough syrup as well   Orders: -     nirmatrelvir/ritonavir; Take 3 tablets by mouth 2 (two) times daily for 5 days. (Take nirmatrelvir 150 mg two tablets twice daily for 5 days and ritonavir 100 mg one tablet twice daily for 5 days) Patient GFR is 80  Dispense: 30 tablet; Refill: 0 -     guaiFENesin-Codeine; Take 5 mLs by mouth at bedtime as needed for cough.  Dispense: 70 mL; Refill: 0  Sore throat -     POC COVID-19 BinaxNow -     POCT Influenza A/B -     POCT rapid strep A  Hashimoto's thyroiditis -     TSH; Future  B12 deficiency -     B12 and Folate Panel; Future  Anxiety and depression -     traZODone HCl; Take 1 tablet (100 mg total) by mouth at bedtime.  Dispense: 90 tablet; Refill: 3  Family history of carcinoma in situ of anal canal Assessment & Plan: Mother has h/o squamous cell anal cancer age 38.  We discussed colonoscopy at age 42.       Return precautions given.   Risks, benefits, and alternatives of the medications and treatment plan prescribed today were discussed, and patient expressed understanding.   Education regarding symptom management and diagnosis given to patient on AVS either electronically or printed.  Return in about 6 months (around 01/27/2023).  Susan Paris, FNP  Subjective:    Patient ID: Susan Adkins, female    DOB: 07/14/1990, 32 y.o.   MRN: [id number]  CC: Susan Adkins is a 32 y.o. female who presents today for an acute visit.    HPI: Complains of cough, fever.   Symptoms started yesterday with bodyaches, fever ( tmax 101.7) , diarrhea, dry cough, sore throat, chest congestion  Home covid test negative yesterdat  Diarrhea resolved  No sob  She is using Tylenol   Requests refill of trazodone , prefers to return to prior dose of 191m from 57mas not as effective for sleep.      Mother has h/o squamous cell anal cancer. Patient denies h/o HIV or other immunocompromised state, annal wart, anal sex, history of HPV related cancer.    Allergies: Patient has no known allergies. Current Outpatient Medications on File Prior to Visit  Medication Sig Dispense Refill   cyanocobalamin (VITAMIN B12) 1000 MCG/ML injection 1000 mcg (1 mL) intramuscular injection in the thigh ( vastus lateralis) once per month 3 mL 4   escitalopram (LEXAPRO) 20 MG tablet Take 1 tablet (20 mg total) by mouth daily. 90 tablet 0   gabapentin (NEURONTIN) 300 MG capsule TAKE 1 CAPSULE BY MOUTH TWICE A DAY 60 capsule 3   Levonorgestrel-Ethinyl Estradiol (AMETHIA) 0.1-0.02 & 0.01 MG tablet TAKE 1 TABLET BY MOUTH EVERY DAY 91 tablet 2   levothyroxine (SYNTHROID) 125 MCG tablet Take 1 tablet (125 mcg total)  by mouth daily. 90 tablet 3   lisdexamfetamine (VYVANSE) 40 MG capsule Take 1 capsule (40 mg total) by mouth every morning. 30 capsule 0   norethindrone-ethinyl estradiol 1/35 (White 1/35, 28,) tablet Take 1 tablet by mouth daily. 28 tablet 11   propranolol ER (INDERAL LA) 80 MG 24 hr capsule TAKE 1 CAPSULE BY MOUTH EVERY DAY 90 capsule 2   No current facility-administered medications on file prior to visit.    Review of Systems  Constitutional:  Negative for chills and fever.  HENT:  Positive for congestion, sinus pain and sore throat. Negative for ear pain.   Respiratory:  Positive for cough.  Negative for shortness of breath.   Cardiovascular:  Negative for chest pain and palpitations.  Gastrointestinal:  Negative for diarrhea (resolved), nausea and vomiting.      Objective:    BP 128/82   Pulse 86   Temp 98.6 F (37 C) (Oral)   Ht 5' 2"$  (1.575 m)   Wt 181 lb (82.1 kg)   LMP  (LMP Unknown)   SpO2 98%   BMI 33.11 kg/m   BP Readings from Last 3 Encounters:  07/29/22 128/82  02/22/22 118/70  11/26/21 122/72   Wt Readings from Last 3 Encounters:  07/29/22 181 lb (82.1 kg)  02/22/22 173 lb 12.8 oz (78.8 kg)  11/26/21 176 lb (79.8 kg)    Physical Exam Vitals reviewed.  Constitutional:      Appearance: She is well-developed.  HENT:     Head: Normocephalic and atraumatic.     Right Ear: Hearing, tympanic membrane, ear canal and external ear normal. No decreased hearing noted. No drainage, swelling or tenderness. No middle ear effusion. No foreign body. Tympanic membrane is not erythematous or bulging.     Left Ear: Hearing, tympanic membrane, ear canal and external ear normal. No decreased hearing noted. No drainage, swelling or tenderness.  No middle ear effusion. No foreign body. Tympanic membrane is not erythematous or bulging.     Nose: Nose normal. No rhinorrhea.     Right Sinus: No maxillary sinus tenderness or frontal sinus tenderness.     Left Sinus: No maxillary sinus tenderness or frontal sinus tenderness.     Mouth/Throat:     Pharynx: Uvula midline. No oropharyngeal exudate or posterior oropharyngeal erythema.     Tonsils: No tonsillar abscesses.  Eyes:     Conjunctiva/sclera: Conjunctivae normal.  Cardiovascular:     Rate and Rhythm: Regular rhythm.     Pulses: Normal pulses.     Heart sounds: Normal heart sounds.  Pulmonary:     Effort: Pulmonary effort is normal.     Breath sounds: Normal breath sounds. No wheezing, rhonchi or rales.  Lymphadenopathy:     Head:     Right side of head: No submental, submandibular, tonsillar, preauricular,  posterior auricular or occipital adenopathy.     Left side of head: No submental, submandibular, tonsillar, preauricular, posterior auricular or occipital adenopathy.     Cervical: No cervical adenopathy.  Skin:    General: Skin is warm and dry.  Neurological:     Mental Status: She is alert.  Psychiatric:        Speech: Speech normal.        Behavior: Behavior normal.        Thought Content: Thought content normal.

## 2022-07-29 NOTE — Assessment & Plan Note (Addendum)
Afebrile on antipyretic.  No respiratory distress.  Counseled on lacking long term safely and effectiveness data of medication, Paxlovid. Explained EUA for Paxlovid. Criteria met for consideration of Paxlovid,  patient older than 12 years and weight > 40kg, started within 5 days of symptom onset. Counseled on drug interaction between trazodone, OCP, and Paxlovid Counseled on adverse effects including altered taste, diarrhea, HTN, and myalgia.   Patient is most comfortable and desires to start Paxlovid and understands to call me with concerns or new symptoms.  Provided with Cheratussin cough syrup as well

## 2022-08-14 ENCOUNTER — Other Ambulatory Visit (INDEPENDENT_AMBULATORY_CARE_PROVIDER_SITE_OTHER): Payer: Commercial Managed Care - PPO

## 2022-08-14 DIAGNOSIS — E063 Autoimmune thyroiditis: Secondary | ICD-10-CM

## 2022-08-14 DIAGNOSIS — E538 Deficiency of other specified B group vitamins: Secondary | ICD-10-CM | POA: Diagnosis not present

## 2022-08-15 LAB — TSH: TSH: 3.39 u[IU]/mL (ref 0.35–5.50)

## 2022-08-15 LAB — B12 AND FOLATE PANEL
Folate: 11.3 ng/mL (ref >5.9)
Vitamin B-12: 396 pg/mL (ref 211–911)

## 2022-09-26 ENCOUNTER — Encounter: Payer: Self-pay | Admitting: Family

## 2022-09-27 NOTE — Telephone Encounter (Signed)
Spoke to pt and scheduled a MCVV

## 2022-09-30 ENCOUNTER — Other Ambulatory Visit: Payer: Self-pay | Admitting: Family

## 2022-09-30 ENCOUNTER — Encounter: Payer: Self-pay | Admitting: Family

## 2022-09-30 ENCOUNTER — Telehealth (INDEPENDENT_AMBULATORY_CARE_PROVIDER_SITE_OTHER): Payer: Commercial Managed Care - PPO | Admitting: Family

## 2022-09-30 VITALS — BP 110/72 | Ht 61.0 in | Wt 181.0 lb

## 2022-09-30 DIAGNOSIS — F909 Attention-deficit hyperactivity disorder, unspecified type: Secondary | ICD-10-CM

## 2022-09-30 DIAGNOSIS — G4700 Insomnia, unspecified: Secondary | ICD-10-CM

## 2022-09-30 DIAGNOSIS — G43801 Other migraine, not intractable, with status migrainosus: Secondary | ICD-10-CM | POA: Diagnosis not present

## 2022-09-30 DIAGNOSIS — S39012A Strain of muscle, fascia and tendon of lower back, initial encounter: Secondary | ICD-10-CM | POA: Diagnosis not present

## 2022-09-30 DIAGNOSIS — M549 Dorsalgia, unspecified: Secondary | ICD-10-CM

## 2022-09-30 MED ORDER — GABAPENTIN 300 MG PO CAPS: 600.0000 mg | ORAL_CAPSULE | Freq: Every day | ORAL | 3 refills | Status: DC

## 2022-09-30 MED ORDER — ESZOPICLONE 1 MG PO TABS: 1.0000 mg | ORAL_TABLET | Freq: Every evening | ORAL | 2 refills | Status: DC | PRN

## 2022-09-30 NOTE — Patient Instructions (Addendum)
Wean of trazodone by taking 50mg  for one week, then stop.   Trial of lunesta 1mg .

## 2022-09-30 NOTE — Assessment & Plan Note (Signed)
Wean of off trazodone. Trial of lunesta 1mg .  I looked up patient on [place] Controlled Substances Reporting System PMP AWARE and saw no activity that raised concern of inappropriate use.

## 2022-09-30 NOTE — Assessment & Plan Note (Signed)
Chronic, stable.  Continue gabapentin 600 mg qhs.

## 2022-09-30 NOTE — Progress Notes (Signed)
Virtual Visit via Video Note  I connected with Susan Adkins on 09/30/22 at  9:00 AM EDT by a video enabled telemedicine application and verified that I am speaking with the correct person using two identifiers. Location patient: home Location provider: work  Persons participating in the virtual visit: patient, provider  I discussed the limitations of evaluation and management by telemedicine and the availability of in person appointments. The patient expressed understanding and agreed to proceed.  HPI: She complains of trouble falling and staying asleep. This has been an issue most of life.  She doesn't feel anxious at that time.  She goes to be at same time every night, 10am.  She felt trazodone 100mg  was better than the 200mg  trazodone.  She is taking gabapentin 600mg  qhs for back pain.   Previously tried [place] in 2017. She was on Amitriptyline when she was 32 years old.   She is no longer on lexpro 20mg .  She self discontinued on her own as does not feel like she needs  She stopped vyvanse as contributing to HA. She has also stopped drinking caffeine. HA are improved  She stay home , home schooling , both of her children.   She has started exercising.    No alcohol use.   ROS: See pertinent positives and negatives per HPI.  EXAM:  VITALS per patient if applicable: BP 110/72   Ht 5\' 1"  (1.549 m)   Wt 181 lb (82.1 kg)   LMP  (LMP Unknown)   BMI 34.20 kg/m  BP Readings from Last 3 Encounters:  09/30/22 110/72  07/29/22 128/82  02/22/22 118/70   Wt Readings from Last 3 Encounters:  09/30/22 181 lb (82.1 kg)  07/29/22 181 lb (82.1 kg)  02/22/22 173 lb 12.8 oz (78.8 kg)    GENERAL: alert, oriented, appears well and in no acute distress  HEENT: atraumatic, conjunttiva clear, no obvious abnormalities on inspection of external nose and ears  NECK: normal movements of the head and neck  LUNGS: on inspection no signs of respiratory distress, breathing rate appears  normal, no obvious gross SOB, gasping or wheezing  CV: no obvious cyanosis  MS: moves all visible extremities without noticeable abnormality  PSYCH/NEURO: pleasant and cooperative, no obvious depression or anxiety, speech and thought processing grossly intact  ASSESSMENT AND PLAN: Insomnia, unspecified type Assessment & Plan: Wean of off trazodone. Trial of lunesta 1mg .  I looked up patient on [place] Controlled Substances Reporting System PMP AWARE and saw no activity that raised concern of inappropriate use.    Orders: -     Eszopiclone; Take 1 tablet (1 mg total) by mouth at bedtime as needed for sleep. Take immediately before bedtime  Dispense: 30 tablet; Refill: 2  Other migraine with status migrainosus, not intractable  Attention deficit hyperactivity disorder (ADHD), unspecified ADHD type  Strain of muscle, fascia and tendon of lower back, initial encounter -     Gabapentin; Take 2 capsules (600 mg total) by mouth at bedtime.  Dispense: 180 capsule; Refill: 3  Back pain, unspecified back location, unspecified back pain laterality, unspecified chronicity Assessment & Plan: Chronic, stable.  Continue gabapentin 600 mg qhs.       -we discussed possible serious and likely etiologies, options for evaluation and workup, limitations of telemedicine visit vs in person visit, treatment, treatment risks and precautions. Pt prefers to treat via telemedicine empirically rather then risking or undertaking an in person visit at this moment.    I discussed the  assessment and treatment plan with the patient. The patient was provided an opportunity to ask questions and all were answered. The patient agreed with the plan and demonstrated an understanding of the instructions.   The patient was advised to call back or seek an in-person evaluation if the symptoms worsen or if the condition fails to improve as anticipated.  Advised if desired AVS can be mailed or viewed via MyChart if Mychart user.    Rennie Plowman, FNP

## 2022-10-04 ENCOUNTER — Telehealth: Payer: Commercial Managed Care - PPO | Admitting: Family

## 2022-10-04 ENCOUNTER — Other Ambulatory Visit: Payer: Self-pay | Admitting: Family

## 2022-10-04 ENCOUNTER — Encounter: Payer: Self-pay | Admitting: Family

## 2022-10-04 DIAGNOSIS — G4700 Insomnia, unspecified: Secondary | ICD-10-CM

## 2022-10-04 MED ORDER — ESZOPICLONE 2 MG PO TABS
2.0000 mg | ORAL_TABLET | Freq: Every evening | ORAL | 2 refills | Status: DC | PRN
Start: 2022-10-04 — End: 2022-12-18

## 2022-11-16 ENCOUNTER — Telehealth: Payer: Self-pay

## 2022-11-16 ENCOUNTER — Other Ambulatory Visit (HOSPITAL_COMMUNITY): Payer: Self-pay

## 2022-11-16 NOTE — Telephone Encounter (Signed)
Received a fax from [facility] regarding Prior Authorization for LUNESTA( ESZOPICLONE) 1MG  AND 2 MG.   Authorization has been DENIED due to QUANTITY LIMIT PLAN WILL ONLY COVER 15 TABLET PER MONTH OR PER 25 DAYS.  THE REQUEST HAS BEEN DENIED FOR MORE PER MONTH.   TEST CLAIM CO-PAY $5.00 FOR 15 PER 25 DAYS

## 2022-11-29 ENCOUNTER — Other Ambulatory Visit: Payer: Self-pay | Admitting: Family

## 2022-11-29 NOTE — Telephone Encounter (Signed)
Medication was discontinued on 09/30/2022. Is it okay to refuse refill?

## 2022-12-14 ENCOUNTER — Other Ambulatory Visit: Payer: Self-pay | Admitting: Family

## 2022-12-18 ENCOUNTER — Encounter: Payer: Self-pay | Admitting: Family

## 2022-12-18 ENCOUNTER — Telehealth: Payer: Commercial Managed Care - PPO | Admitting: Family

## 2022-12-18 VITALS — Ht 62.0 in | Wt 175.1 lb

## 2022-12-18 DIAGNOSIS — G4700 Insomnia, unspecified: Secondary | ICD-10-CM | POA: Diagnosis not present

## 2022-12-18 DIAGNOSIS — F909 Attention-deficit hyperactivity disorder, unspecified type: Secondary | ICD-10-CM

## 2022-12-18 MED ORDER — LISDEXAMFETAMINE DIMESYLATE 40 MG PO CAPS
40.0000 mg | ORAL_CAPSULE | ORAL | 0 refills | Status: DC
Start: 2022-12-18 — End: 2022-12-25

## 2022-12-18 MED ORDER — AMPHETAMINE-DEXTROAMPHETAMINE 5 MG PO TABS: 5.0000 mg | ORAL_TABLET | Freq: Every day | ORAL | 0 refills | Status: DC

## 2022-12-18 MED ORDER — ESZOPICLONE 2 MG PO TABS: 2.0000 mg | ORAL_TABLET | Freq: Every evening | ORAL | 2 refills | Status: DC | PRN

## 2022-12-18 NOTE — Progress Notes (Signed)
Virtual Visit via Video Note  I connected with Susan Adkins on 12/18/22 at 11:30 AM EDT by a video enabled telemedicine application and verified that I am speaking with the correct person using two identifiers. Location patient: home Location provider: work  Persons participating in the virtual visit: patient, provider  I discussed the limitations of evaluation and management by telemedicine and the availability of in person appointments. The patient expressed understanding and agreed to proceed.  HPI: She is interested in resuming vyvanse as she is struggling to complete tasks.  She started an old prescription of vyvanse 40mg  for the past 5 days which was effective and she even feels more patient with her children. No increased anxiety, HA, difficulty sleeping.   She notices that vyvanse wears off in the early afternoons. She would like to have short acting medication for afternoons.   Previously stopped Vyvanse 6 months ago as contributing to headache; at that time she had been on vyvanse 50mg .  She has been on 40 mg daily.  She is no longer on trazodone.  Compliant with Lunesta 2 mg  ROS: See pertinent positives and negatives per HPI.  EXAM:  VITALS per patient if applicable: Ht 5\' 2"  (1.575 m)   Wt 175 lb 1.6 oz (79.4 kg)   BMI 32.03 kg/m  BP Readings from Last 3 Encounters:  09/30/22 110/72  07/29/22 128/82  02/22/22 118/70   Wt Readings from Last 3 Encounters:  12/18/22 175 lb 1.6 oz (79.4 kg)  09/30/22 181 lb (82.1 kg)  07/29/22 181 lb (82.1 kg)    GENERAL: alert, oriented, appears well and in no acute distress  HEENT: atraumatic, conjunttiva clear, no obvious abnormalities on inspection of external nose and ears  NECK: normal movements of the head and neck  LUNGS: on inspection no signs of respiratory distress, breathing rate appears normal, no obvious gross SOB, gasping or wheezing  CV: no obvious cyanosis  MS: moves all visible extremities without  noticeable abnormality  PSYCH/NEURO: pleasant and cooperative, no obvious depression or anxiety, speech and thought processing grossly intact  ASSESSMENT AND PLAN: Attention deficit hyperactivity disorder (ADHD), unspecified ADHD type Assessment & Plan: Resume Vyvanse 40mg  . Provided her with adderall 5mg  to use as needed. She will monitor for HA, increased anxiety or trouble sleeping.   Orders: -     Lisdexamfetamine Dimesylate; Take 1 capsule (40 mg total) by mouth every morning.  Dispense: 30 capsule; Refill: 0 -     Lisdexamfetamine Dimesylate; Take 1 capsule (40 mg total) by mouth every morning.  Dispense: 30 capsule; Refill: 0 -     Lisdexamfetamine Dimesylate; Take 1 capsule (40 mg total) by mouth every morning.  Dispense: 30 capsule; Refill: 0 -     Amphetamine-Dextroamphetamine; Take 1 tablet (5 mg total) by mouth daily.  Dispense: 30 tablet; Refill: 0  Insomnia, unspecified type Assessment & Plan: Sleep improved on lunesta versus trazodone.  Continue lunesta 2mg .  I looked up patient on [place] Controlled Substances Reporting System PMP AWARE and saw no activity that raised concern of inappropriate use.    Orders: -     Eszopiclone; Take 1 tablet (2 mg total) by mouth at bedtime as needed for sleep. Take immediately before bedtime  Dispense: 30 tablet; Refill: 2     -we discussed possible serious and likely etiologies, options for evaluation and workup, limitations of telemedicine visit vs in person visit, treatment, treatment risks and precautions. Pt prefers to treat via telemedicine empirically rather then  risking or undertaking an in person visit at this moment.    I discussed the assessment and treatment plan with the patient. The patient was provided an opportunity to ask questions and all were answered. The patient agreed with the plan and demonstrated an understanding of the instructions.   The patient was advised to call back or seek an in-person evaluation if the symptoms  worsen or if the condition fails to improve as anticipated.  Advised if desired AVS can be mailed or viewed via MyChart if Mychart user.   Rennie Plowman, FNP

## 2022-12-18 NOTE — Assessment & Plan Note (Signed)
Sleep improved on lunesta versus trazodone.  Continue lunesta 2mg .  I looked up patient on [place] Controlled Substances Reporting System PMP AWARE and saw no activity that raised concern of inappropriate use.

## 2022-12-18 NOTE — Patient Instructions (Addendum)
Resume Vyvanse 40mg    Adderall 5mg  as needed in the afternoon  Nice to see you!   Lisdexamfetamine Capsules What is this medication? LISDEXAMFETAMINE (lis DEX am fet a meen) treats attention-deficit hyperactivity disorder (ADHD). It may also be used to treat binge eating disorder. It works by reducing hyperactivity and impulsive behaviors. It belongs to a group of medications called stimulants. This medicine may be used for other purposes; ask your health care provider or pharmacist if you have questions. COMMON BRAND NAME(S): Vyvanse What should I tell my care team before I take this medication? They need to know if you have any of these conditions: Anxiety or panic attacks Circulation problems in fingers and toes Glaucoma Hardening or blockages of the arteries or heart blood vessels Heart disease or a heart defect High blood pressure History of stroke Kidney disease Liver disease Mental health condition Seizures Substance use disorder Suicidal thoughts, plans, or attempt by you or a family member Thyroid disease Tourette's syndrome An unusual or allergic reaction to lisdexamfetamine, other medications, foods, dyes, or preservatives Pregnant or trying to get pregnant Breast-feeding How should I use this medication? Take this medication by mouth. Follow the directions on the prescription label. Swallow the capsules with a drink of water. You may open capsule and add to a glass of water, then drink right away. Take your doses at regular intervals. Do not take your medication more often than directed. Do not suddenly stop your medication. You must gradually reduce the dose, or you may feel withdrawal effects. Ask your care team for advice. A special MedGuide will be given to you by the pharmacist with each prescription and refill. Be sure to read this information carefully each time. Talk to your care team about the use of this medication in children. While this medication may be  prescribed for children as young as 49 years of age for selected conditions, precautions do apply. Overdosage: If you think you have taken too much of this medicine contact a poison control center or emergency room at once. NOTE: This medicine is only for you. Do not share this medicine with others. What if I miss a dose? If you miss a dose, take it as soon as you can. If it is almost time for your next dose, take only that dose. Do not take double or extra doses. What may interact with this medication? Do not take this medication with any of the following: Linezolid MAOIs, such as Marplan, Nardil, and Parnate Methylene blue This medication may also interact with the following: Acetazolamide Alcohol Ascorbic acid Certain medications for depression, anxiety, or other mental health conditions Certain medications for migraines, such as sumatriptan Guanethidine Opioids Reserpine Sodium bicarbonate St. John's wort Thiazide diuretics, such as chlorothiazide Tryptophan This list may not describe all possible interactions. Give your health care provider a list of all the medicines, herbs, non-prescription drugs, or dietary supplements you use. Also tell them if you smoke, drink alcohol, or use illegal drugs. Some items may interact with your medicine. What should I watch for while using this medication? Visit your care team for regular checks on your progress. Tell your care team if your symptoms do not start to get better or if they get worse. This medication requires a new prescription from your care team every time it is filled at the pharmacy. This medication can be abused and cause your brain and body to depend on it after high doses or long term use. Your care team will assess  your risk and monitor you closely during treatment. Long term use of this medication may cause your brain and body to depend on it. You may be able to take breaks from this medication during weekends, holidays, or  summer vacations. Talk to your care team about what works for you. If your care team wants you to stop this medication permanently, the dose may be slowly lowered over time to reduce the risk of side effects. Tell your care team if this medication loses its effects, or if you feel you need to take more than the prescribed amount. Do not change your dose without talking to your care team. Do not take this medication close to bedtime. It may prevent you from sleeping. Loss of appetite is common when starting this medication. Eating small, frequent meals or snacks can help. Talk to your care team if appetite loss persists. Children should have height and weight checked often while taking this medication. Tell your care team right away if you notice unexplained wounds on your fingers and toes while taking this medication. You should also tell your care team if you experience numbness or pain, changes in the skin color, or sensitivity to temperature in your fingers or toes. Contact your care team right away if you have an erection that lasts longer than 4 hours or if it becomes painful. This may be a sign of a serious problem and must be treated right away to prevent permanent damage. What side effects may I notice from receiving this medication? Side effects that you should report to your care team as soon as possible: Allergic reactions--skin rash, itching, hives, swelling of the face, lips, tongue, or throat Heart attack--pain or tightness in the chest, shoulders, arms, or jaw, nausea, shortness of breath, cold or clammy skin, feeling faint or lightheaded Heart rhythm changes--fast or irregular heartbeat, dizziness, feeling faint or lightheaded, chest pain, trouble breathing Increase in blood pressure Irritability, confusion, fast or irregular heartbeat, muscle stiffness, twitching muscles, sweating, high fever, seizure, chills, vomiting, diarrhea, which may be signs of serotonin syndrome Mood and  behavior changes--anxiety, nervousness, confusion, hallucinations, irritability, hostility, thoughts of suicide or self-harm, worsening mood, feelings of depression Prolonged or painful erection Raynaud syndrome--cool, numb, or painful fingers or toes that may change color from pale, to blue, to red Seizures Stroke--sudden numbness or weakness of the face, arm, or leg, trouble speaking, confusion, trouble walking, loss of balance or coordination, dizziness, severe headache, change in vision Side effects that usually do not require medical attention (report these to your care team if they continue or are bothersome): Dry mouth Headache Loss of appetite with weight loss Nausea Stomach pain Trouble sleeping This list may not describe all possible side effects. Call your doctor for medical advice about side effects. You may report side effects to FDA at 1-800-FDA-1088. Where should I keep my medication? Keep out of the reach of children and pets. This medication can be abused. Keep it in a safe place to protect it from theft. Do not share it with anyone. It is only for you. Selling or giving away this medication is dangerous and against the law. Store at room temperature between 15 and 30 degrees C (59 and 86 degrees F). Protect from light. Keep container tightly closed. Get rid of any unused medication after the expiration date. This medication may cause harm and death if it is taken by other adults, children, or pets. It is important to get rid of the medication as soon as  you no longer need it or it is expired. You can do this in two ways: Take the medication to a medication take-back program. Check with your pharmacy or law enforcement to find a location. If you cannot return the medication, check the label or package insert to see if the medication should be thrown out in the garbage or flushed down the toilet. If you are not sure, ask your care team. If it is safe to put it in the trash, take the  medication out of the container. Mix the medication with cat litter, dirt, coffee grounds, or other unwanted substance. Seal the mixture in a bag or container. Put it in the trash. NOTE: This sheet is a summary. It may not cover all possible information. If you have questions about this medicine, talk to your doctor, pharmacist, or health care provider.  2024 Elsevier/Gold Standard (2022-08-11 00:00:00)

## 2022-12-18 NOTE — Assessment & Plan Note (Addendum)
Resume Vyvanse 40mg  . Provided her with adderall 5mg  to use as needed. She will monitor for HA, increased anxiety or trouble sleeping.

## 2022-12-23 ENCOUNTER — Telehealth: Payer: Self-pay

## 2022-12-23 NOTE — Telephone Encounter (Signed)
I saw notification that patient had scheduled a MyChart appointment with Rennie Plowman, FNP, for 12/24/2022, but it looked like it was rescheduled to 12/25/2022.  I called patient to be sure she is ok, since her appointment note referenced panic attacks and severe headache since starting vyvanse.  I offered to let patient speak with Access Nurse, but patient declined.  Patient states she has stopped taking the medication and her symptoms have decreased.  Patient states she believes she just needs to change her medication.

## 2022-12-24 ENCOUNTER — Telehealth: Payer: Commercial Managed Care - PPO | Admitting: Family

## 2022-12-25 ENCOUNTER — Telehealth: Payer: Commercial Managed Care - PPO | Admitting: Family

## 2022-12-25 ENCOUNTER — Encounter: Payer: Self-pay | Admitting: Family

## 2022-12-25 DIAGNOSIS — F909 Attention-deficit hyperactivity disorder, unspecified type: Secondary | ICD-10-CM | POA: Diagnosis not present

## 2022-12-25 DIAGNOSIS — F509 Eating disorder, unspecified: Secondary | ICD-10-CM | POA: Insufficient documentation

## 2022-12-25 MED ORDER — BUPROPION HCL ER (XL) 150 MG PO TB24
150.0000 mg | ORAL_TABLET | ORAL | 1 refills | Status: DC
Start: 2022-12-25 — End: 2023-01-29

## 2022-12-25 NOTE — Progress Notes (Signed)
Virtual Visit via Video Note  I connected with Susan Adkins on 12/25/22 at 12:00 PM EDT by a video enabled telemedicine application and verified that I am speaking with the correct person using two identifiers. Location patient: home Location provider: work  Persons participating in the virtual visit: patient, provider  I discussed the limitations of evaluation and management by telemedicine and the availability of in person appointments. The patient expressed understanding and agreed to proceed.  HPI: Vyvanse 40 mg exacerbated headaches, anxiety.  Endorses panic attacks.  She has since stopped Vyvanse. She is interested in alternative.  She has felt more depressive symptoms since discontinuing Vyvanse.   She states that she does not have an eating disorder and she feels incorrect in her medical record. This was a 'diagnsosis' per her pediatrician in highschool when she had lost weight.    She never tried to loose weight at that time.  She describes that during a period of highschool, weight loss was more related to depression and stress. Her mother was going through a divorce and her mom would leave her for weekends at a time alone. She didn't prepare meals or eat when she was alone.  She has never forced herself to vomit.  No heavy alcohol use.   No h/o seizure  ROS: See pertinent positives and negatives per HPI.  EXAM:  VITALS per patient if applicable: There were no vitals taken for this visit. BP Readings from Last 3 Encounters:  09/30/22 110/72  07/29/22 128/82  02/22/22 118/70   Wt Readings from Last 3 Encounters:  12/18/22 175 lb 1.6 oz (79.4 kg)  09/30/22 181 lb (82.1 kg)  07/29/22 181 lb (82.1 kg)      12/25/2022   12:04 PM 12/18/2022   11:34 AM 09/30/2022    9:01 AM  Depression screen PHQ 2/9  Decreased Interest 0 0 0  Down, Depressed, Hopeless 0 0 0  PHQ - 2 Score 0 0 0  Altered sleeping 1    Tired, decreased energy 1    Change in appetite 0    Feeling bad  or failure about yourself  0    Trouble concentrating 1    Moving slowly or fidgety/restless 0    Suicidal thoughts 0    PHQ-9 Score 3    Difficult doing work/chores Not difficult at all      GENERAL: alert, oriented, appears well and in no acute distress  HEENT: atraumatic, conjunttiva clear, no obvious abnormalities on inspection of external nose and ears  NECK: normal movements of the head and neck  LUNGS: on inspection no signs of respiratory distress, breathing rate appears normal, no obvious gross SOB, gasping or wheezing  CV: no obvious cyanosis  MS: moves all visible extremities without noticeable abnormality  PSYCH/NEURO: pleasant and cooperative, no obvious depression or anxiety, speech and thought processing grossly intact  ASSESSMENT AND PLAN: Attention deficit hyperactivity disorder (ADHD), unspecified ADHD type Assessment & Plan: Long discussion and clarification in regards to patient's prior reported history of eating disorder.  She clarified that this was inaccurate.  I have removed this from her medical record.  We discussed Wellbutrin and how it lowers the threshold for seizure which is why wellbutrin is contraindicated with a history of eating disorder.  Trial of Wellbutrin.  Counseled on titration, side effects, and contraindication with alcohol.  Referral to [place] [facility] attention specialist as well .  Close follow up  Orders: -     Ambulatory referral to Psychiatry -  buPROPion HCl ER (XL); Take 1 tablet (150 mg total) by mouth every morning.  Dispense: 90 tablet; Refill: 1     -we discussed possible serious and likely etiologies, options for evaluation and workup, limitations of telemedicine visit vs in person visit, treatment, treatment risks and precautions. Pt prefers to treat via telemedicine empirically rather then risking or undertaking an in person visit at this moment.    I discussed the assessment and treatment plan with the patient. The  patient was provided an opportunity to ask questions and all were answered. The patient agreed with the plan and demonstrated an understanding of the instructions.   The patient was advised to call back or seek an in-person evaluation if the symptoms worsen or if the condition fails to improve as anticipated.  Advised if desired AVS can be mailed or viewed via MyChart if Mychart user.   Rennie Plowman, FNP

## 2022-12-25 NOTE — Assessment & Plan Note (Addendum)
Long discussion and clarification in regards to patient's prior reported history of eating disorder.  She clarified that this was inaccurate.  I have removed this from her medical record.  We discussed Wellbutrin and how it lowers the threshold for seizure which is why wellbutrin is contraindicated with a history of eating disorder.  Trial of Wellbutrin.  Counseled on titration, side effects, and contraindication with alcohol.  Referral to [place] [facility] attention specialist as well .  Close follow up

## 2022-12-25 NOTE — Patient Instructions (Addendum)
Trial of wellbutrin  We discussed today starting medication called Wellbutrin.  As also discussed, you must limit alcohol on this medication as alcohol and Wellbutrin together may increase your risk for seizure.  You may drink no more than 1 alcoholic beverage on this medication.  A standard drink is 12 ounces of regular beer, which is usually about 5% alcohol OR 5 ounces of wine, which is typically about 12% alcohol OR   1.5 ounces of distilled spirits, which is about 40% alcohol    Bupropion Extended-Release Tablets (Depression/Mood Disorders) What is this medication? BUPROPION (byoo PROE pee on) treats depression. It increases norepinephrine and dopamine in the brain, hormones that help regulate mood. It belongs to a group of medications called NDRIs. This medicine may be used for other purposes; ask your health care provider or pharmacist if you have questions. COMMON BRAND NAME(S): Aplenzin, Budeprion XL, Forfivo XL, Wellbutrin XL What should I tell my care team before I take this medication? They need to know if you have any of these conditions: An eating disorder, such as anorexia or bulimia Bipolar disorder or psychosis Diabetes or high blood sugar, treated with medication Glaucoma Head injury or brain tumor Heart disease, previous heart attack, or irregular heart beat High blood pressure Kidney disease Liver disease Seizures Suicidal thoughts, plans, or attempt by you or a family member Tourette syndrome Weight loss An unusual or allergic reaction to bupropion, other medications, foods, dyes, or preservatives Pregnant or trying to become pregnant Breastfeeding How should I use this medication? Take this medication by mouth with a glass of water. Follow the directions on the prescription label. You can take it with or without food. If it upsets your stomach, take it with food. Do not crush, chew, or cut these tablets. This medication is taken once daily at the same time  each day. Do not take your medication more often than directed. Do not stop taking this medication suddenly except upon the advice of your care team. Stopping this medication too quickly may cause serious side effects or your condition may worsen. A special MedGuide will be given to you by the pharmacist with each prescription and refill. Be sure to read this information carefully each time. Talk to your care team about the use of this medication in children. Special care may be needed. Overdosage: If you think you have taken too much of this medicine contact a poison control center or emergency room at once. NOTE: This medicine is only for you. Do not share this medicine with others. What if I miss a dose? If you miss a dose, skip the missed dose and take your next tablet at the regular time. Do not take double or extra doses. What may interact with this medication? Do not take this medication with any of the following: Linezolid MAOIs, such as Azilect, Carbex, Eldepryl, Marplan, Nardil, and Parnate Methylene blue (injected into a vein) Other medications that contain bupropion, such as Zyban This medication may also interact with the following: Alcohol Certain medications for anxiety or sleep Certain medications for blood pressure, such as metoprolol, propranolol Certain medications for HIV or AIDS, such as efavirenz, lopinavir, nelfinavir, ritonavir Certain medications for irregular heartbeat, such as propafenone, flecainide Certain medications for mental health conditions Certain medications for Parkinson disease, such as amantadine, levodopa Certain medications for seizures, such as carbamazepine, phenytoin, phenobarbital Cimetidine Clopidogrel Cyclophosphamide Digoxin Furazolidone Isoniazid Nicotine Orphenadrine Procarbazine Steroid medications, such as prednisone or cortisone Stimulant medications for attention disorders, weight loss,  or to stay  awake Tamoxifen Theophylline Thiotepa Ticlopidine Tramadol Warfarin This list may not describe all possible interactions. Give your health care provider a list of all the medicines, herbs, non-prescription drugs, or dietary supplements you use. Also tell them if you smoke, drink alcohol, or use illegal drugs. Some items may interact with your medicine. What should I watch for while using this medication? Tell your care team if your symptoms do not get better or if they get worse. Visit your care team for regular checks on your progress. Because it may take several weeks to see the full effects of this medication, it is important to continue your treatment as prescribed. This medication may cause thoughts of suicide or depression. This includes sudden changes in mood, behaviors, or thoughts. These changes can happen at any time but are more common in the beginning of treatment or after a change in dose. Call your care team right away if you experience these thoughts or worsening depression. This medication may cause mood and behavior changes, such as anxiety, nervousness, irritability, hostility, restlessness, excitability, hyperactivity, or trouble sleeping. These changes can happen at any time but are more common in the beginning of treatment or after a change in dose. Call your care team right away if you notice any of these symptoms. This medication may cause serious skin reactions. They can happen weeks to months after starting the medication. Contact your care team right away if you notice fevers or flu-like symptoms with a rash. The rash may be red or purple and then turn into blisters or peeling of the skin. You may also notice a red rash with swelling of the face, lips, or lymph nodes in your neck or under your arms. Avoid drinks that contain alcohol while taking this medication. Drinking large amounts of alcohol, using sleeping or anxiety medications, or quickly stopping the use of these agents  while taking this medication may increase your risk for a seizure. This medication may affect your coordination, reaction time, or judgment. Do not drive or operate machinery until you know how this medication affects you. Do not take this medication close to bedtime. It may prevent you from sleeping. Your mouth may get dry. Chewing sugarless gum or sucking hard candy and drinking plenty of water may help. Contact your care team if the problem does not go away or is severe. The tablet shell for some brands of this medication does not dissolve. This is normal. The tablet shell may appear whole in the stool. This is not a cause for concern. What side effects may I notice from receiving this medication? Side effects that you should report to your care team as soon as possible: Allergic reactions--skin rash, itching, hives, swelling of the face, lips, tongue, or throat Increase in blood pressure Mood and behavior changes--anxiety, nervousness, confusion, hallucinations, irritability, hostility, thoughts of suicide or self-harm, worsening mood, feelings of depression Redness, blistering, peeling, or loosening of the skin, including inside the mouth Seizures Sudden eye pain or change in vision such as blurry vision, seeing halos around lights, vision loss Side effects that usually do not require medical attention (report to your care team if they continue or are bothersome): Constipation Dizziness Dry mouth Loss of appetite Nausea Tremors or shaking Trouble sleeping This list may not describe all possible side effects. Call your doctor for medical advice about side effects. You may report side effects to FDA at 1-800-FDA-1088. Where should I keep my medication? Keep out of the reach of  children and pets. Store at room temperature between 15 and 30 degrees C (59 and 86 degrees F). Throw away any unused medication after the expiration date. NOTE: This sheet is a summary. It may not cover all  possible information. If you have questions about this medicine, talk to your doctor, pharmacist, or health care provider.  2024 Elsevier/Gold Standard (2022-02-24 00:00:00)

## 2022-12-27 ENCOUNTER — Ambulatory Visit: Payer: Commercial Managed Care - PPO | Admitting: Family

## 2022-12-27 ENCOUNTER — Telehealth: Payer: Commercial Managed Care - PPO | Admitting: Family

## 2022-12-27 ENCOUNTER — Encounter: Payer: Self-pay | Admitting: Family

## 2022-12-27 ENCOUNTER — Other Ambulatory Visit (HOSPITAL_COMMUNITY): Payer: Self-pay

## 2022-12-27 ENCOUNTER — Telehealth (HOSPITAL_COMMUNITY): Payer: Self-pay | Admitting: Pharmacy Technician

## 2022-12-27 NOTE — Telephone Encounter (Signed)
Pharmacy Patient Advocate Encounter  Received notification from [facility] that Prior Authorization for Amphetamine-Dextroamphetamine 5MG  tabletshas been APPROVED from 12/27/2022 to 12/26/2025.Marland Kitchen  PA #/Case ID/Reference #: [id number]  Copay is $5.00

## 2022-12-30 ENCOUNTER — Other Ambulatory Visit: Payer: Self-pay | Admitting: Family

## 2022-12-30 ENCOUNTER — Telehealth: Payer: Commercial Managed Care - PPO | Admitting: Family

## 2022-12-30 DIAGNOSIS — F32A Anxiety disorder, unspecified: Secondary | ICD-10-CM

## 2022-12-30 MED ORDER — ESCITALOPRAM OXALATE 10 MG PO TABS
10.0000 mg | ORAL_TABLET | Freq: Every day | ORAL | 3 refills | Status: DC
Start: 2022-12-30 — End: 2023-07-01

## 2022-12-31 ENCOUNTER — Other Ambulatory Visit: Payer: Self-pay | Admitting: Family

## 2022-12-31 DIAGNOSIS — G43709 Chronic migraine without aura, not intractable, without status migrainosus: Secondary | ICD-10-CM

## 2023-01-29 ENCOUNTER — Telehealth: Payer: Commercial Managed Care - PPO | Admitting: Family

## 2023-01-29 ENCOUNTER — Encounter: Payer: Self-pay | Admitting: Family

## 2023-01-29 VITALS — Ht 62.0 in | Wt 173.1 lb

## 2023-01-29 DIAGNOSIS — F419 Anxiety disorder, unspecified: Secondary | ICD-10-CM | POA: Diagnosis not present

## 2023-01-29 DIAGNOSIS — G4700 Insomnia, unspecified: Secondary | ICD-10-CM | POA: Diagnosis not present

## 2023-01-29 DIAGNOSIS — F32A Depression, unspecified: Secondary | ICD-10-CM | POA: Diagnosis not present

## 2023-01-29 DIAGNOSIS — F909 Attention-deficit hyperactivity disorder, unspecified type: Secondary | ICD-10-CM | POA: Diagnosis not present

## 2023-01-29 MED ORDER — AMPHETAMINE-DEXTROAMPHETAMINE 5 MG PO TABS
5.0000 mg | ORAL_TABLET | Freq: Two times a day (BID) | ORAL | 0 refills | Status: DC
Start: 2023-01-29 — End: 2023-03-26

## 2023-01-29 NOTE — Assessment & Plan Note (Addendum)
Suboptimal control. We discussed sleep latency and getting out of the bed. We discussed an increase lunesta 3mg . She prefers trial of lifestyle management first.

## 2023-01-29 NOTE — Assessment & Plan Note (Signed)
Chronic, improved. No longer on wellbutrin. Continue lexapro 10mg  qd

## 2023-01-29 NOTE — Progress Notes (Signed)
Virtual Visit via Video Note  I connected with Susan Adkins on 01/31/23 at  4:00 PM EDT by a video enabled telemedicine application and verified that I am speaking with the correct person using two identifiers. Location patient: home Location provider: work  Persons participating in the virtual visit: patient, provider  I discussed the limitations of evaluation and management by telemedicine and the availability of in person appointments. The patient expressed understanding and agreed to proceed.  HPI: Follow up depression  She is feeling much better on lexapro 10mg . People in her life have also noticed that she is feeling better.   Fatigue has improved.   She weaned off the wellbutrin and tinnitus has resolved.   She is taking adderal 5mg  BID without increase of anxiety. Concentration improved.  She doesn't feel adderall affects her falling asleep. She has yet to schedule with [facility].     She takes lunesta 2mg  and falls asleep hours later and sleeps 4 hours. She takes melatonin with lunesta without improvement.   No formal exercise. She has sleep routine of reading prior to bedtime.    ROS: See pertinent positives and negatives per HPI.  EXAM:  VITALS per patient if applicable: Ht 5\' 2"  (1.575 m)   Wt 173 lb 1.6 oz (78.5 kg)   BMI 31.66 kg/m  BP Readings from Last 3 Encounters:  09/30/22 110/72  07/29/22 128/82  02/22/22 118/70   Wt Readings from Last 3 Encounters:  01/29/23 173 lb 1.6 oz (78.5 kg)  12/18/22 175 lb 1.6 oz (79.4 kg)  09/30/22 181 lb (82.1 kg)    GENERAL: alert, oriented, appears well and in no acute distress  HEENT: atraumatic, conjunttiva clear, no obvious abnormalities on inspection of external nose and ears  NECK: normal movements of the head and neck  LUNGS: on inspection no signs of respiratory distress, breathing rate appears normal, no obvious gross SOB, gasping or wheezing  CV: no obvious cyanosis  MS: moves  all visible extremities without noticeable abnormality  PSYCH/NEURO: pleasant and cooperative, no obvious depression or anxiety, speech and thought processing grossly intact  ASSESSMENT AND PLAN: Attention deficit hyperactivity disorder (ADHD), unspecified ADHD type Assessment & Plan: Chronic, stable.  She has tolerated well Adderall 5 mg twice daily without increased anxiety.  She does not feel this is affecting her sleep.  We agreed to continue Adderall 5 mg twice daily with close vigilance of adverse side effects.  Orders: -     Amphetamine-Dextroamphetamine; Take 1 tablet (5 mg total) by mouth 2 (two) times daily.  Dispense: 60 tablet; Refill: 0 -     Amphetamine-Dextroamphetamine; Take 1 tablet (5 mg total) by mouth 2 (two) times daily.  Dispense: 60 tablet; Refill: 0 -     Amphetamine-Dextroamphetamine; Take 1 tablet (5 mg total) by mouth 2 (two) times daily.  Dispense: 60 tablet; Refill: 0  Anxiety and depression Assessment & Plan: Chronic, improved. No longer on wellbutrin. Continue lexapro 10mg  qd   Insomnia, unspecified type Assessment & Plan: Suboptimal control. We discussed sleep latency and getting out of the bed. We discussed an increase lunesta 3mg . She prefers trial of lifestyle management first.       -we discussed possible serious and likely etiologies, options for evaluation and workup, limitations of telemedicine visit vs in person visit, treatment, treatment risks and precautions. Pt prefers to treat via telemedicine empirically rather then risking or undertaking an in person visit at this moment.    I discussed the  assessment and treatment plan with the patient. The patient was provided an opportunity to ask questions and all were answered. The patient agreed with the plan and demonstrated an understanding of the instructions.   The patient was advised to call back or seek an in-person evaluation if the symptoms worsen or if the condition fails to improve as  anticipated.  Advised if desired AVS can be mailed or viewed via MyChart if Mychart user.   Rennie Plowman, FNP

## 2023-01-31 NOTE — Assessment & Plan Note (Addendum)
Chronic, stable.  She has tolerated well Adderall 5 mg twice daily without increased anxiety.  She does not feel this is affecting her sleep.  We agreed to continue Adderall 5 mg twice daily with close vigilance of adverse side effects.

## 2023-03-26 ENCOUNTER — Ambulatory Visit: Payer: Commercial Managed Care - PPO | Admitting: Family

## 2023-03-26 VITALS — BP 106/70 | HR 75 | Temp 98.8°F | Ht 62.0 in | Wt 172.2 lb

## 2023-03-26 DIAGNOSIS — N946 Dysmenorrhea, unspecified: Secondary | ICD-10-CM | POA: Diagnosis not present

## 2023-03-26 DIAGNOSIS — E063 Autoimmune thyroiditis: Secondary | ICD-10-CM

## 2023-03-26 DIAGNOSIS — Z23 Encounter for immunization: Secondary | ICD-10-CM | POA: Diagnosis not present

## 2023-03-26 DIAGNOSIS — G43801 Other migraine, not intractable, with status migrainosus: Secondary | ICD-10-CM

## 2023-03-26 DIAGNOSIS — Z0000 Encounter for general adult medical examination without abnormal findings: Secondary | ICD-10-CM | POA: Diagnosis not present

## 2023-03-26 DIAGNOSIS — G4700 Insomnia, unspecified: Secondary | ICD-10-CM | POA: Diagnosis not present

## 2023-03-26 LAB — CBC WITH DIFFERENTIAL/PLATELET
Basophils Absolute: 0 10*3/uL (ref 0.0–0.1)
Basophils Relative: 0.5 % (ref 0.0–3.0)
Eosinophils Absolute: 0.1 10*3/uL (ref 0.0–0.7)
Eosinophils Relative: 1.9 % (ref 0.0–5.0)
HCT: 45.1 % (ref 36.0–46.0)
Hemoglobin: 14.7 g/dL (ref 12.0–15.0)
Lymphocytes Relative: 26 % (ref 12.0–46.0)
Lymphs Abs: 1.7 10*3/uL (ref 0.7–4.0)
MCHC: 32.7 g/dL (ref 30.0–36.0)
MCV: 93.1 fL (ref 78.0–100.0)
Monocytes Absolute: 0.4 10*3/uL (ref 0.1–1.0)
Monocytes Relative: 6.2 % (ref 3.0–12.0)
Neutro Abs: 4.4 10*3/uL (ref 1.4–7.7)
Neutrophils Relative %: 65.4 % (ref 43.0–77.0)
Platelets: 302 10*3/uL (ref 150.0–400.0)
RBC: 4.84 Mil/uL (ref 3.87–5.11)
RDW: 12 % (ref 11.5–15.5)
WBC: 6.7 10*3/uL (ref 4.0–10.5)

## 2023-03-26 LAB — COMPREHENSIVE METABOLIC PANEL
ALT: 48 U/L — ABNORMAL HIGH (ref 0–35)
AST: 27 U/L (ref 0–37)
Albumin: 4.2 g/dL (ref 3.5–5.2)
Alkaline Phosphatase: 112 U/L (ref 39–117)
BUN: 10 mg/dL (ref 6–23)
CO2: 28 meq/L (ref 19–32)
Calcium: 9 mg/dL (ref 8.4–10.5)
Chloride: 103 meq/L (ref 96–112)
Creatinine, Ser: 0.91 mg/dL (ref 0.40–1.20)
GFR: 84.01 mL/min (ref >60.00)
Glucose, Bld: 92 mg/dL (ref 70–99)
Potassium: 4.3 meq/L (ref 3.5–5.1)
Sodium: 139 meq/L (ref 135–145)
Total Bilirubin: 0.4 mg/dL (ref 0.2–1.2)
Total Protein: 6.7 g/dL (ref 6.0–8.3)

## 2023-03-26 LAB — LIPID PANEL
Cholesterol: 188 mg/dL (ref 0–200)
HDL: 55.8 mg/dL (ref >39.00)
LDL Cholesterol: 109 mg/dL — ABNORMAL HIGH (ref 0–99)
NonHDL: 132.06
Total CHOL/HDL Ratio: 3
Triglycerides: 116 mg/dL (ref 0.0–149.0)
VLDL: 23.2 mg/dL (ref 0.0–40.0)

## 2023-03-26 LAB — IBC + FERRITIN
Ferritin: 89.4 ng/mL (ref 10.0–291.0)
Iron: 157 ug/dL — ABNORMAL HIGH (ref 42–145)
Saturation Ratios: 45.4 % (ref 20.0–50.0)
TIBC: 345.8 ug/dL (ref 250.0–450.0)
Transferrin: 247 mg/dL (ref 212.0–360.0)

## 2023-03-26 LAB — TSH: TSH: 5.88 u[IU]/mL — ABNORMAL HIGH (ref 0.35–5.50)

## 2023-03-26 LAB — HEMOGLOBIN A1C: Hgb A1c MFr Bld: 5.4 % (ref 4.6–6.5)

## 2023-03-26 MED ORDER — DROSPIRENONE-ETHINYL ESTRADIOL 3-0.03 MG PO TABS
1.0000 | ORAL_TABLET | Freq: Every day | ORAL | 3 refills | Status: DC
Start: 2023-03-26 — End: 2023-09-08

## 2023-03-26 MED ORDER — ESZOPICLONE 2 MG PO TABS
2.0000 mg | ORAL_TABLET | Freq: Every evening | ORAL | 2 refills | Status: DC | PRN
Start: 2023-03-26 — End: 2023-07-01

## 2023-03-26 NOTE — Progress Notes (Signed)
Assessment & Plan:  Routine physical examination Assessment & Plan: Patient politely declines pelvic exam and clinical breast exam in the office.  Pap smear is up-to-date.  Encouraged exercise  Orders: -     CBC with Differential/Platelet -     Comprehensive metabolic panel -     Hemoglobin A1c -     Lipid panel -     TSH  Need for influenza vaccination -     Flu vaccine trivalent PF, 6mos and older(Flulaval,Afluria,Fluarix,Fluzone)  Dysmenorrhea Assessment & Plan: Transvaginal ultrasound 03/04/2022 without abnormalities.  Endometrial stripe within normal limits. H/o ehlers danlos syndrome and question if playing a role. From reading ,estrogen containing birth control may offer protect effects in regard to protective effect on collagen and joint stability.  From reading I would avoid progesterone only methods Stop Amethia OCP. Trial of Yasmin 3-0.3 , 28 days. Discussed that she may cycle on Yasmin and take placebo pills every couple of months to minimize breakthrough bleeding.  I have sent a message to Doreene Burke in regards to plan for further advice and she agrees with trial of Yasmin.  If dysmenorrhea doesn't improve within a couple of months, plan to arrange GYN follow up and repeat transvaginal US.    Orders: -     CBC with Differential/Platelet -     Drospirenone-Ethinyl Estradiol; Take 1 tablet by mouth daily.  Dispense: 90 tablet; Refill: 3 -     IBC + Ferritin  Hashimoto's thyroiditis -     TSH  Insomnia, unspecified type -     Eszopiclone; Take 1 tablet (2 mg total) by mouth at bedtime as needed for sleep. Take immediately before bedtime  Dispense: 30 tablet; Refill: 2  Other migraine with status migrainosus, not intractable Assessment & Plan: Aggravated by Adderall, menses.  Patient has stopped Adderall.  Changing Amethia to Yasmin  3-0.3 . Close follow up.       Return precautions given.   Risks, benefits, and alternatives of the medications and treatment  plan prescribed today were discussed, and patient expressed understanding.   Education regarding symptom management and diagnosis given to patient on AVS either electronically or printed.  No follow-ups on file.  Susan Plowman, FNP  Subjective:    Patient ID: Susan Adkins, female    DOB: 1991-03-16, 32 y.o.   MRN: [id number]  CC: Susan Adkins is a 32 y.o. female who presents today for physical exam.    HPI: Accompanied by children today   Complains of heavy menstrual bleeding She is spotting daily and then intermittent breakthrough BRB Clots size of quarters and silver dollar Endorses suprapubic cramping during menses.    No rectal bleeding.   No history of DVT. Non smoker    She gets migraines during placebo week on OCP so preference is to cycle OCP.   Compliant with amethia.   She cycles on birth control to control bleeding and migriane.   She has been on yasmin in highschool and interested in retrial.    Previously Doreene Burke prescribed norethindrone, ethinyl estradiol 1/35, 28  Transvaginal ultrasound 03/04/2022 without abnormalities.  Endometrial stripe within normal limits.  She stopped Adderall as aggravated headache.  She feels well Lexapro 10 mg   Colorectal Cancer Screening: Plan to start colonoscopy screen at 18 yo due to mothers h/o squamous cell anal cancer.  Breast Cancer Screening: plan to start screening at 32 years of age.   Cervical Cancer Screening: UTD; 10 02/22/2022 negative malignancy, HPV  Bone Health screening/DEXA for 65+: No increased fracture risk. Defer screening at this time.        Tetanus - UTD Exercise: Gets regular exercise with playing kids.   Alcohol use:  none Smoking/tobacco use: Nonsmoker.    Health Maintenance  Topic Date Due  . COVID-19 Vaccine (3 - 2023-24 season) 02/16/2023  . Pap with HPV screening  02/23/2027  . DTaP/Tdap/Td vaccine (3 - Td or Tdap) 11/20/2028  . Flu Shot  Completed  . Hepatitis C Screening   Completed  . HIV Screening  Completed  . HPV Vaccine  Aged Out    ALLERGIES: Wellbutrin [bupropion]  Current Outpatient Medications on File Prior to Visit  Medication Sig Dispense Refill  . cyanocobalamin (VITAMIN B12) 1000 MCG/ML injection 1000 mcg (1 mL) intramuscular injection in the thigh ( vastus lateralis) once per month 3 mL 4  . escitalopram (LEXAPRO) 10 MG tablet Take 1 tablet (10 mg total) by mouth daily. 90 tablet 3  . gabapentin (NEURONTIN) 300 MG capsule TAKE 1 CAPSULE BY MOUTH TWICE A DAY 60 capsule 3  . levothyroxine (SYNTHROID) 125 MCG tablet Take 1 tablet (125 mcg total) by mouth daily. 90 tablet 3  . propranolol ER (INDERAL LA) 80 MG 24 hr capsule TAKE 1 CAPSULE BY MOUTH EVERY DAY 90 capsule 3   No current facility-administered medications on file prior to visit.    Review of Systems  Constitutional:  Negative for chills, fever and unexpected weight change.  HENT:  Negative for congestion.   Respiratory:  Negative for cough.   Cardiovascular:  Negative for chest pain, palpitations and leg swelling.  Gastrointestinal:  Negative for anal bleeding, blood in stool, nausea and vomiting.  Genitourinary:  Positive for menstrual problem and vaginal bleeding.  Musculoskeletal:  Negative for arthralgias and myalgias.  Skin:  Negative for rash.  Neurological:  Negative for headaches.  Hematological:  Negative for adenopathy.  Psychiatric/Behavioral:  Negative for confusion.       Objective:    BP 106/70   Pulse 75   Temp 98.8 F (37.1 C) (Oral)   Ht 5\' 2"  (1.575 m)   Wt 172 lb 3.2 oz (78.1 kg)   LMP 03/26/2023   SpO2 97%   BMI 31.50 kg/m   BP Readings from Last 3 Encounters:  03/26/23 106/70  09/30/22 110/72  07/29/22 128/82   Wt Readings from Last 3 Encounters:  03/26/23 172 lb 3.2 oz (78.1 kg)  01/29/23 173 lb 1.6 oz (78.5 kg)  12/18/22 175 lb 1.6 oz (79.4 kg)    Physical Exam Vitals reviewed.  Constitutional:      Appearance: She is  well-developed.  Eyes:     Conjunctiva/sclera: Conjunctivae normal.  Neck:     Thyroid: No thyroid mass or thyromegaly.  Cardiovascular:     Rate and Rhythm: Normal rate and regular rhythm.     Pulses: Normal pulses.     Heart sounds: Normal heart sounds.  Pulmonary:     Effort: Pulmonary effort is normal.     Breath sounds: Normal breath sounds. No wheezing, rhonchi or rales.  Lymphadenopathy:     Head:     Right side of head: No submental, submandibular, tonsillar, preauricular, posterior auricular or occipital adenopathy.     Left side of head: No submental, submandibular, tonsillar, preauricular, posterior auricular or occipital adenopathy.     Cervical: No cervical adenopathy.  Skin:    General: Skin is warm and dry.  Neurological:     Mental  Status: She is alert.  Psychiatric:        Speech: Speech normal.        Behavior: Behavior normal.        Thought Content: Thought content normal.

## 2023-03-26 NOTE — Assessment & Plan Note (Signed)
Patient politely declines pelvic exam and clinical breast exam in the office.  Pap smear is up-to-date.  Encouraged exercise

## 2023-03-26 NOTE — Assessment & Plan Note (Addendum)
Transvaginal ultrasound 03/04/2022 without abnormalities.  Endometrial stripe within normal limits. H/o ehlers danlos syndrome and question if playing a role. From reading ,estrogen containing birth control may offer protect effects in regard to protective effect on collagen and joint stability.  From reading I would avoid progesterone only methods Stop Amethia OCP. Trial of Yasmin 3-0.3 , 28 days. Discussed that she may cycle on Yasmin and take placebo pills every couple of months to minimize breakthrough bleeding.  I have sent a message to Doreene Burke in regards to plan for further advice and she agrees with trial of Yasmin.  If dysmenorrhea doesn't improve within a couple of months, plan to arrange GYN follow up and repeat transvaginal US.

## 2023-03-26 NOTE — Assessment & Plan Note (Signed)
Aggravated by Adderall, menses.  Patient has stopped Adderall.  Changing Amethia to Yasmin  3-0.3 . Close follow up.

## 2023-03-26 NOTE — Patient Instructions (Addendum)
Stop Amethia birth control  Start Yasmin 28. You may consider initiating as cyclic therapy for 3 to 6 months, then transitioning to extended cycles.   If no resolution, let me know so we can arrange follow up with Doreene Burke.  Health Maintenance, Female Adopting a healthy lifestyle and getting preventive care are important in promoting health and wellness. Ask your health care provider about: The right schedule for you to have regular tests and exams. Things you can do on your own to prevent diseases and keep yourself healthy. What should I know about diet, weight, and exercise? Eat a healthy diet  Eat a diet that includes plenty of vegetables, fruits, low-fat dairy products, and lean protein. Do not eat a lot of foods that are high in solid fats, added sugars, or sodium. Maintain a healthy weight Body mass index (BMI) is used to identify weight problems. It estimates body fat based on height and weight. Your health care provider can help determine your BMI and help you achieve or maintain a healthy weight. Get regular exercise Get regular exercise. This is one of the most important things you can do for your health. Most adults should: Exercise for at least 150 minutes each week. The exercise should increase your heart rate and make you sweat (moderate-intensity exercise). Do strengthening exercises at least twice a week. This is in addition to the moderate-intensity exercise. Spend less time sitting. Even light physical activity can be beneficial. Watch cholesterol and blood lipids Have your blood tested for lipids and cholesterol at 31 years of age, then have this test every 5 years. Have your cholesterol levels checked more often if: Your lipid or cholesterol levels are high. You are older than 31 years of age. You are at high risk for heart disease. What should I know about cancer screening? Depending on your health history and family history, you may need to have cancer  screening at various ages. This may include screening for: Breast cancer. Cervical cancer. Colorectal cancer. Skin cancer. Lung cancer. What should I know about heart disease, diabetes, and high blood pressure? Blood pressure and heart disease High blood pressure causes heart disease and increases the risk of stroke. This is more likely to develop in people who have high blood pressure readings or are overweight. Have your blood pressure checked: Every 3-5 years if you are 9-6 years of age. Every year if you are 76 years old or older. Diabetes Have regular diabetes screenings. This checks your fasting blood sugar level. Have the screening done: Once every three years after age 34 if you are at a normal weight and have a low risk for diabetes. More often and at a younger age if you are overweight or have a high risk for diabetes. What should I know about preventing infection? Hepatitis B If you have a higher risk for hepatitis B, you should be screened for this virus. Talk with your health care provider to find out if you are at risk for hepatitis B infection. Hepatitis C Testing is recommended for: Everyone born from [birth year] through 1964. Anyone with known risk factors for hepatitis C. Sexually transmitted infections (STIs) Get screened for STIs, including gonorrhea and chlamydia, if: You are sexually active and are younger than 31 years of age. You are older than 31 years of age and your health care provider tells you that you are at risk for this type of infection. Your sexual activity has changed since you were last screened, and you are at  increased risk for chlamydia or gonorrhea. Ask your health care provider if you are at risk. Ask your health care provider about whether you are at high risk for HIV. Your health care provider may recommend a prescription medicine to help prevent HIV infection. If you choose to take medicine to prevent HIV, you should first get tested for HIV. You  should then be tested every 3 months for as long as you are taking the medicine. Pregnancy If you are about to stop having your period (premenopausal) and you may become pregnant, seek counseling before you get pregnant. Take 400 to 800 micrograms (mcg) of folic acid every day if you become pregnant. Ask for birth control (contraception) if you want to prevent pregnancy. Osteoporosis and menopause Osteoporosis is a disease in which the bones lose minerals and strength with aging. This can result in bone fractures. If you are 15 years old or older, or if you are at risk for osteoporosis and fractures, ask your health care provider if you should: Be screened for bone loss. Take a calcium or vitamin D supplement to lower your risk of fractures. Be given hormone replacement therapy (HRT) to treat symptoms of menopause. Follow these instructions at home: Alcohol use Do not drink alcohol if: Your health care provider tells you not to drink. You are pregnant, may be pregnant, or are planning to become pregnant. If you drink alcohol: Limit how much you have to: 0-1 drink a day. Know how much alcohol is in your drink. In the [place] one drink equals one 12 oz bottle of beer (355 mL), one 5 oz glass of wine (148 mL), or one 1 oz glass of hard liquor (44 mL). Lifestyle Do not use any products that contain nicotine or tobacco. These products include cigarettes, chewing tobacco, and vaping devices, such as e-cigarettes. If you need help quitting, ask your health care provider. Do not use street drugs. Do not share needles. Ask your health care provider for help if you need support or information about quitting drugs. General instructions Schedule regular health, dental, and eye exams. Stay current with your vaccines. Tell your health care provider if: You often feel depressed. You have ever been abused or do not feel safe at home. Summary Adopting a healthy lifestyle and getting preventive care are  important in promoting health and wellness. Follow your health care provider's instructions about healthy diet, exercising, and getting tested or screened for diseases. Follow your health care provider's instructions on monitoring your cholesterol and blood pressure. This information is not intended to replace advice given to you by your health care provider. Make sure you discuss any questions you have with your health care provider. Document Revised: 10/23/2020 Document Reviewed: 10/23/2020 Elsevier Patient Education  2024 [employer].

## 2023-04-04 ENCOUNTER — Other Ambulatory Visit: Payer: Self-pay | Admitting: Family

## 2023-04-04 ENCOUNTER — Encounter: Payer: Self-pay | Admitting: Family

## 2023-04-04 DIAGNOSIS — R899 Unspecified abnormal finding in specimens from other organs, systems and tissues: Secondary | ICD-10-CM

## 2023-04-04 DIAGNOSIS — E063 Autoimmune thyroiditis: Secondary | ICD-10-CM

## 2023-04-04 MED ORDER — LEVOTHYROXINE SODIUM 150 MCG PO TABS
150.0000 ug | ORAL_TABLET | Freq: Every day | ORAL | 3 refills | Status: DC
Start: 2023-04-04 — End: 2024-03-22

## 2023-04-16 ENCOUNTER — Encounter: Payer: Self-pay | Admitting: Family

## 2023-04-18 ENCOUNTER — Encounter: Payer: Self-pay | Admitting: Family

## 2023-04-18 ENCOUNTER — Other Ambulatory Visit: Payer: Self-pay | Admitting: Family

## 2023-04-18 NOTE — Telephone Encounter (Signed)
I contacted the patient via phone. The patient reports she will keep taking her [facility] and will call back if she needs an appointment but declines appointment at this time.

## 2023-04-21 ENCOUNTER — Encounter: Payer: Self-pay | Admitting: Family

## 2023-04-29 ENCOUNTER — Other Ambulatory Visit (INDEPENDENT_AMBULATORY_CARE_PROVIDER_SITE_OTHER): Payer: Commercial Managed Care - PPO

## 2023-04-29 DIAGNOSIS — R899 Unspecified abnormal finding in specimens from other organs, systems and tissues: Secondary | ICD-10-CM

## 2023-04-29 DIAGNOSIS — E063 Autoimmune thyroiditis: Secondary | ICD-10-CM | POA: Diagnosis not present

## 2023-04-29 LAB — CBC WITH DIFFERENTIAL/PLATELET
Basophils Absolute: 0.1 10*3/uL (ref 0.0–0.1)
Basophils Relative: 0.8 % (ref 0.0–3.0)
Eosinophils Absolute: 0.4 10*3/uL (ref 0.0–0.7)
Eosinophils Relative: 5.9 % — ABNORMAL HIGH (ref 0.0–5.0)
HCT: 41.7 % (ref 36.0–46.0)
Hemoglobin: 14.2 g/dL (ref 12.0–15.0)
Lymphocytes Relative: 21.3 % (ref 12.0–46.0)
Lymphs Abs: 1.6 10*3/uL (ref 0.7–4.0)
MCHC: 34 g/dL (ref 30.0–36.0)
MCV: 92.2 fL (ref 78.0–100.0)
Monocytes Absolute: 0.4 10*3/uL (ref 0.1–1.0)
Monocytes Relative: 4.8 % (ref 3.0–12.0)
Neutro Abs: 5 10*3/uL (ref 1.4–7.7)
Neutrophils Relative %: 67.2 % (ref 43.0–77.0)
Platelets: 267 10*3/uL (ref 150.0–400.0)
RBC: 4.53 Mil/uL (ref 3.87–5.11)
RDW: 11.7 % (ref 11.5–15.5)
WBC: 7.5 10*3/uL (ref 4.0–10.5)

## 2023-04-29 LAB — TSH: TSH: 0.81 u[IU]/mL (ref 0.35–5.50)

## 2023-04-30 ENCOUNTER — Other Ambulatory Visit: Payer: Self-pay

## 2023-04-30 DIAGNOSIS — R899 Unspecified abnormal finding in specimens from other organs, systems and tissues: Secondary | ICD-10-CM

## 2023-05-02 ENCOUNTER — Telehealth: Payer: Commercial Managed Care - PPO | Admitting: Family

## 2023-05-04 ENCOUNTER — Other Ambulatory Visit: Payer: Self-pay | Admitting: Family

## 2023-05-04 DIAGNOSIS — M549 Dorsalgia, unspecified: Secondary | ICD-10-CM

## 2023-05-19 ENCOUNTER — Other Ambulatory Visit: Payer: Commercial Managed Care - PPO

## 2023-06-15 ENCOUNTER — Other Ambulatory Visit: Payer: Self-pay | Admitting: Family

## 2023-06-19 ENCOUNTER — Other Ambulatory Visit (INDEPENDENT_AMBULATORY_CARE_PROVIDER_SITE_OTHER): Payer: 59

## 2023-06-19 DIAGNOSIS — R899 Unspecified abnormal finding in specimens from other organs, systems and tissues: Secondary | ICD-10-CM

## 2023-06-20 LAB — CBC WITH DIFFERENTIAL/PLATELET
Basophils Absolute: 0.1 10*3/uL (ref 0.0–0.1)
Basophils Relative: 0.6 % (ref 0.0–3.0)
Eosinophils Absolute: 0.2 10*3/uL (ref 0.0–0.7)
Eosinophils Relative: 3 % (ref 0.0–5.0)
HCT: 41.8 % (ref 36.0–46.0)
Hemoglobin: 14.2 g/dL (ref 12.0–15.0)
Lymphocytes Relative: 29.4 % (ref 12.0–46.0)
Lymphs Abs: 2.3 10*3/uL (ref 0.7–4.0)
MCHC: 33.9 g/dL (ref 30.0–36.0)
MCV: 93.1 fL (ref 78.0–100.0)
Monocytes Absolute: 0.5 10*3/uL (ref 0.1–1.0)
Monocytes Relative: 5.9 % (ref 3.0–12.0)
Neutro Abs: 4.8 10*3/uL (ref 1.4–7.7)
Neutrophils Relative %: 61.1 % (ref 43.0–77.0)
Platelets: 308 10*3/uL (ref 150.0–400.0)
RBC: 4.49 Mil/uL (ref 3.87–5.11)
RDW: 12.2 % (ref 11.5–15.5)
WBC: 7.9 10*3/uL (ref 4.0–10.5)

## 2023-07-01 ENCOUNTER — Encounter: Payer: Self-pay | Admitting: Family

## 2023-07-01 ENCOUNTER — Telehealth (INDEPENDENT_AMBULATORY_CARE_PROVIDER_SITE_OTHER): Payer: 59 | Admitting: Family

## 2023-07-01 VITALS — Ht 62.0 in | Wt 172.2 lb

## 2023-07-01 DIAGNOSIS — F419 Anxiety disorder, unspecified: Secondary | ICD-10-CM | POA: Diagnosis not present

## 2023-07-01 DIAGNOSIS — F32A Depression, unspecified: Secondary | ICD-10-CM | POA: Diagnosis not present

## 2023-07-01 DIAGNOSIS — G4700 Insomnia, unspecified: Secondary | ICD-10-CM

## 2023-07-01 DIAGNOSIS — G43801 Other migraine, not intractable, with status migrainosus: Secondary | ICD-10-CM | POA: Diagnosis not present

## 2023-07-01 MED ORDER — NAPROXEN SODIUM 550 MG PO TABS: ORAL_TABLET | ORAL | 1 refills | Status: DC

## 2023-07-01 MED ORDER — ESZOPICLONE 2 MG PO TABS: 2.0000 mg | ORAL_TABLET | Freq: Every evening | ORAL | 2 refills | Status: DC | PRN

## 2023-07-01 MED ORDER — VENLAFAXINE HCL ER 37.5 MG PO CP24
37.5000 mg | ORAL_CAPSULE | Freq: Every day | ORAL | 3 refills | Status: DC
Start: 2023-07-01 — End: 2023-07-29

## 2023-07-01 MED ORDER — ONDANSETRON 4 MG PO TBDP: 4.0000 mg | ORAL_TABLET | Freq: Three times a day (TID) | ORAL | 1 refills | Status: DC | PRN

## 2023-07-01 NOTE — Patient Instructions (Addendum)
 For headache rescue, take naprosen with zofran  Stop lexapro and start effexor.

## 2023-07-01 NOTE — Assessment & Plan Note (Addendum)
 Suboptimal control. Change from lexapro to effexor. Consider nortriptyline at follow up.

## 2023-07-01 NOTE — Assessment & Plan Note (Signed)
 Uncontrolled. Increased anxiety. Change from lexapro 10mg  to effexor 37.5mg .

## 2023-07-01 NOTE — Progress Notes (Signed)
 Virtual Visit via Video Note  I connected with Susan Adkins on 07/02/23 at  9:30 AM EST by a video enabled telemedicine application and verified that I am speaking with the correct person using two identifiers. Location patient: home Location provider: work  Persons participating in the virtual visit: patient, provider  I discussed the limitations of evaluation and management by telemedicine and the availability of in person appointments. The patient expressed understanding and agreed to proceed.  HPI: She describes increased anxiety .  She describes low threshold to feeling anxiety, irritability.   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   She is interested in increasing lexapro  to 20mg ; she has been on lexapro  20mg  years ago and decreased as didn't feel that she needed.   HA are daily. A/w  She has had 3 'migraines' in the last 3 months. HA are always on the left side over her left eye. She sees flashing in left eye which is not unusual for her.   HA feel similar to HA in the past.   No sinus congestion, vision loss.  HA aggravated by Adderall, menses. Patient has stopped Adderall.   She has tried buspar , atarax .   Denies SI.    ROS: See pertinent positives and negatives per HPI.  EXAM:  VITALS per patient if applicable: Ht 5' 2 (1.575 m)   Wt 172 lb 3.2 oz (78.1 kg)   LMP  (LMP Unknown)   BMI 31.50 kg/m  BP Readings from Last 3 Encounters:  03/26/23 106/70  09/30/22 110/72  07/29/22 128/82   Wt Readings from Last 3 Encounters:  07/01/23 172 lb 3.2 oz (78.1 kg)  03/26/23 172 lb 3.2 oz (78.1 kg)  01/29/23 173 lb 1.6 oz (78.5 kg)      07/01/2023    9:35 AM 03/26/2023   12:12 PM 01/29/2023    3:55 PM  Depression screen PHQ 2/9  Decreased Interest 0 1 0  Down, Depressed, Hopeless 1 0 0  PHQ - 2 Score 1 1 0  Altered sleeping 1 1 0  Tired, decreased energy 1 1 0  Change in appetite 0 0 0  Feeling bad or failure about yourself  2 0 0  Trouble  concentrating 0 1 0  Moving slowly or fidgety/restless 0 0 0  Suicidal thoughts 0 0 0  PHQ-9 Score 5 4 0  Difficult doing work/chores Not difficult at all Somewhat difficult Not difficult at all    GENERAL: alert, oriented, appears well and in no acute distress  HEENT: atraumatic, conjunttiva clear, no obvious abnormalities on inspection of external nose and ears  NECK: normal movements of the head and neck  LUNGS: on inspection no signs of respiratory distress, breathing rate appears normal, no obvious gross SOB, gasping or wheezing  CV: no obvious cyanosis  MS: moves all visible extremities without noticeable abnormality  PSYCH/NEURO: pleasant and cooperative, no obvious depression or anxiety, speech and thought processing grossly intact  ASSESSMENT AND PLAN: Anxiety and depression Assessment & Plan: Uncontrolled. Increased anxiety. Change from lexapro  10mg  to effexor  37.5mg .   Orders: -     Venlafaxine  HCl ER; Take 1 capsule (37.5 mg total) by mouth daily with breakfast.  Dispense: 30 capsule; Refill: 3  Other migraine with status migrainosus, not intractable Assessment & Plan: Suboptimal control. Change from lexapro  to effexor . Consider nortriptyline at follow up.     Orders: -     Venlafaxine  HCl ER; Take 1 capsule (37.5 mg total) by  mouth daily with breakfast.  Dispense: 30 capsule; Refill: 3 -     Naproxen  Sodium; Take at earliest onset of headache. May repeat dose in 12 hours if needed. Do not exceed two tablets in 24 hours. Take with food  Dispense: 30 tablet; Refill: 1 -     Ondansetron ; Take 1 tablet (4 mg total) by mouth every 8 (eight) hours as needed for nausea or vomiting.  Dispense: 30 tablet; Refill: 1  Insomnia, unspecified type -     Eszopiclone ; Take 1 tablet (2 mg total) by mouth at bedtime as needed for sleep. Take immediately before bedtime  Dispense: 30 tablet; Refill: 2     -we discussed possible serious and likely etiologies, options for  evaluation and workup, limitations of telemedicine visit vs in person visit, treatment, treatment risks and precautions. Pt prefers to treat via telemedicine empirically rather then risking or undertaking an in person visit at this moment.    I discussed the assessment and treatment plan with the patient. The patient was provided an opportunity to ask questions and all were answered. The patient agreed with the plan and demonstrated an understanding of the instructions.   The patient was advised to call back or seek an in-person evaluation if the symptoms worsen or if the condition fails to improve as anticipated.  Advised if desired AVS can be mailed or viewed via MyChart if Mychart user.   Rollene Northern, FNP

## 2023-07-02 ENCOUNTER — Telehealth: Payer: 59 | Admitting: Family

## 2023-07-10 ENCOUNTER — Telehealth: Payer: 59 | Admitting: Family

## 2023-07-29 ENCOUNTER — Telehealth (INDEPENDENT_AMBULATORY_CARE_PROVIDER_SITE_OTHER): Payer: 59 | Admitting: Family

## 2023-07-29 ENCOUNTER — Encounter: Payer: Self-pay | Admitting: Family

## 2023-07-29 VITALS — Ht 67.0 in | Wt 172.2 lb

## 2023-07-29 DIAGNOSIS — F32A Depression, unspecified: Secondary | ICD-10-CM | POA: Diagnosis not present

## 2023-07-29 DIAGNOSIS — G43801 Other migraine, not intractable, with status migrainosus: Secondary | ICD-10-CM

## 2023-07-29 DIAGNOSIS — F419 Anxiety disorder, unspecified: Secondary | ICD-10-CM

## 2023-07-29 MED ORDER — VENLAFAXINE HCL ER 75 MG PO CP24
75.0000 mg | ORAL_CAPSULE | Freq: Every day | ORAL | 3 refills | Status: DC
Start: 2023-07-29 — End: 2023-10-07

## 2023-07-29 NOTE — Progress Notes (Unsigned)
Virtual Visit via Video Note  I connected with Susan Adkins on 07/30/23 at  9:30 AM EST by a video enabled telemedicine application and verified that I am speaking with the correct person using two identifiers. Location patient: home Location provider: work  Persons participating in the virtual visit: patient, provider  I discussed the limitations of evaluation and management by telemedicine and the availability of in person appointments. The patient expressed understanding and agreed to proceed.  HPI:  Follow-up anxiety, migraine.  She is no longer on Lexapro.  Started on Effexor 37.5 mg.  She is interested in increasing Effexor.  Depression has improved on effexor. She is feeling more productive. She is able to respond to her children with more patience.  She has had daily HA  while on medication, however describes as not a migraine.   She has not had a migraine while on effexor though isnt sure she has given enough time.  She is sleeping better.   ROS: See pertinent positives and negatives per HPI.  EXAM:  VITALS per patient if applicable: Ht 5\' 7"  (1.702 m)   Wt 172 lb 3.2 oz (78.1 kg)   LMP  (LMP Unknown)   BMI 26.97 kg/m  BP Readings from Last 3 Encounters:  03/26/23 106/70  09/30/22 110/72  07/29/22 128/82   Wt Readings from Last 3 Encounters:  07/29/23 172 lb 3.2 oz (78.1 kg)  07/01/23 172 lb 3.2 oz (78.1 kg)  03/26/23 172 lb 3.2 oz (78.1 kg)      07/29/2023    9:28 AM 07/29/2023    9:24 AM 07/01/2023    9:35 AM  Depression screen PHQ 2/9  Decreased Interest 0 0 0  Down, Depressed, Hopeless 0 0 1  PHQ - 2 Score 0 0 1  Altered sleeping 0 0 1  Tired, decreased energy 0 0 1  Change in appetite 0 0 0  Feeling bad or failure about yourself  0 0 2  Trouble concentrating 0 0 0  Moving slowly or fidgety/restless 0 0 0  Suicidal thoughts 0 0 0  PHQ-9 Score 0 0 5  Difficult doing work/chores Not difficult at all Not difficult at all Not difficult at all      GENERAL: alert, oriented, appears well and in no acute distress  HEENT: atraumatic, conjunttiva clear, no obvious abnormalities on inspection of external nose and ears  NECK: normal movements of the head and neck  LUNGS: on inspection no signs of respiratory distress, breathing rate appears normal, no obvious gross SOB, gasping or wheezing  CV: no obvious cyanosis  MS: moves all visible extremities without noticeable abnormality  PSYCH/NEURO: pleasant and cooperative, no obvious depression or anxiety, speech and thought processing grossly intact  ASSESSMENT AND PLAN: Anxiety and depression Assessment & Plan: Improved.  Increase Effexor 75 mg daily.  She is no longer on Lexapro  Orders: -     Venlafaxine HCl ER; Take 1 capsule (75 mg total) by mouth daily with breakfast.  Dispense: 90 capsule; Refill: 3  Other migraine with status migrainosus, not intractable Assessment & Plan: Suboptimal control. She is no longer on lexapro. Increase effexor to 75mg . Consider nortriptyline at follow up.         -we discussed possible serious and likely etiologies, options for evaluation and workup, limitations of telemedicine visit vs in person visit, treatment, treatment risks and precautions. Pt prefers to treat via telemedicine empirically rather then risking or undertaking an in person visit at this moment.  I discussed the assessment and treatment plan with the patient. The patient was provided an opportunity to ask questions and all were answered. The patient agreed with the plan and demonstrated an understanding of the instructions.   The patient was advised to call back or seek an in-person evaluation if the symptoms worsen or if the condition fails to improve as anticipated.  Advised if desired AVS can be mailed or viewed via MyChart if Mychart user.   Rennie Plowman, FNP

## 2023-07-29 NOTE — Patient Instructions (Signed)
Increase?

## 2023-07-30 NOTE — Assessment & Plan Note (Signed)
Suboptimal control. She is no longer on lexapro. Increase effexor to 75mg . Consider nortriptyline at follow up.

## 2023-07-30 NOTE — Assessment & Plan Note (Signed)
Improved.  Increase Effexor 75 mg daily.  She is no longer on Lexapro

## 2023-08-26 ENCOUNTER — Other Ambulatory Visit: Payer: Self-pay | Admitting: Family

## 2023-08-26 DIAGNOSIS — S39012A Strain of muscle, fascia and tendon of lower back, initial encounter: Secondary | ICD-10-CM

## 2023-09-03 ENCOUNTER — Other Ambulatory Visit: Payer: Self-pay | Admitting: Family

## 2023-09-03 DIAGNOSIS — G43801 Other migraine, not intractable, with status migrainosus: Secondary | ICD-10-CM

## 2023-09-05 ENCOUNTER — Encounter: Payer: Self-pay | Admitting: Family

## 2023-09-08 ENCOUNTER — Other Ambulatory Visit: Payer: Self-pay | Admitting: Family

## 2023-09-08 DIAGNOSIS — G4700 Insomnia, unspecified: Secondary | ICD-10-CM

## 2023-09-08 DIAGNOSIS — N946 Dysmenorrhea, unspecified: Secondary | ICD-10-CM

## 2023-09-08 MED ORDER — DROSPIRENONE-ETHINYL ESTRADIOL 3-0.03 MG PO TABS: 1.0000 | ORAL_TABLET | Freq: Every day | ORAL | 3 refills | Status: DC

## 2023-09-08 MED ORDER — ESZOPICLONE 2 MG PO TABS: 2.0000 mg | ORAL_TABLET | Freq: Every evening | ORAL | 2 refills | Status: DC | PRN

## 2023-09-09 ENCOUNTER — Other Ambulatory Visit: Payer: Self-pay | Admitting: Family

## 2023-09-09 DIAGNOSIS — N946 Dysmenorrhea, unspecified: Secondary | ICD-10-CM

## 2023-09-09 MED ORDER — LEVONORGEST-ETH ESTRAD 91-DAY 0.1-0.02 & 0.01 MG PO TABS: 1.0000 | ORAL_TABLET | Freq: Every day | ORAL | 4 refills | Status: AC

## 2023-09-30 ENCOUNTER — Telehealth: Payer: Self-pay

## 2023-09-30 NOTE — Telephone Encounter (Signed)
 Patient scheduled an appointment via MyChart with Bascom Bossier, FNP, on 10/15/2023 the following comment:  "Med refill and painful lump/spot in breast".

## 2023-10-01 NOTE — Telephone Encounter (Signed)
 Spoke to pt she switched appt to in person visit

## 2023-10-06 ENCOUNTER — Ambulatory Visit: Payer: Self-pay

## 2023-10-06 NOTE — Telephone Encounter (Signed)
 Pt is seeing Dr. Sueanne Emerald tomorrow.

## 2023-10-06 NOTE — Telephone Encounter (Signed)
 Attempted to contact pt, call could not be completed as dialed.   Copied from CRM [id number]. Topic: Clinical - Red Word Triage >> Oct 06, 2023  1:58 PM Susan Adkins wrote: Red Word that prompted transfer to Nurse Triage: Patient is requesting a sooner appointment than 10/15/23 due to a lump on her breast. She stated that as of today, the pain has worsened and is now radiating to her armpit and shoulder.  Call Back Number: [phone]

## 2023-10-06 NOTE — Telephone Encounter (Signed)
 Chief Complaint: breast pain Symptoms: breast pain and lump Frequency: 2 months, worsening  Pertinent Negatives: Patient denies N/V, redness, heat, rash, drainage  Disposition: [] ED /[] Urgent Care (no appt availability in office) / [x] Appointment(In office/virtual)/ []  [facility]/ [] Home Care/ [] Refused Recommended Disposition /[] [facility]/ []  Follow-up with PCP Additional Notes: Pt reports L breast pain and lump for 2 months. Pt states the breast is painful to palpation and states the pain radiates to her armpit and shoulder. Pt endorses 6/10 pain on palpation and 3/10 pain otherwise. Pt states she got very sick 3 weeks and had a  low-grade fever after. Temperature today is 99.60F. Denies redness, rash, heat, drainage, N/V. Pt has an appt 4/30 to discuss these symptoms and her medications. RN advised pt she should be seen before then within 24 hours. RN scheduled pt for tomorrow at 0900. RN advised pt if she develops a high fever, rash, redness, drainage, N/V she needs to go to the ED. Pt verbalized understanding,   Copied from CRM [id number]. Topic: Clinical - Red Word Triage >> Oct 06, 2023  1:58 PM Susan Adkins wrote: Red Word that prompted transfer to Nurse Triage: Patient is requesting a sooner appointment than 10/15/23 due to a lump on her breast. She stated that as of today, the pain has worsened and is now radiating to her armpit and shoulder.  Call Back Number: [phone] Reason for Disposition  [1] Breast looks infected (spreading redness, feels hot or painful to touch) AND [2] no fever  Answer Assessment - Initial Assessment Questions 1. SYMPTOM: "What's the main symptom you're concerned about?"  (e.g., lump, pain, rash, nipple discharge)     Pain and lump to her L breast 2. LOCATION: "Where is the pain located?"     L breast 3. ONSET: "When did pain start?"     2 months, noticed in the shower 4. PRIOR HISTORY: "Do you have any history of prior problems with  your breasts?" (e.g., lumps, cancer, fibrocystic breast disease)     No  5. CAUSE: "What do you think is causing this symptom?"     Not sure  6. OTHER SYMPTOMS: "Do you have any other symptoms?" (e.g., fever, breast pain, redness or rash, nipple discharge)     Pt states pain has worsened over the last few days. Pt states if the breast is touched the pain is worse and then radiates to her armpit and shoulder. Denies rash to the breast. "Slight fever" "but I was also sick 3 weeks ago for a week which finally went away, then I kind of continued having a low grade fever", coughing up a lot of mucus that is keeping her up at night (yellow), denies difficulty breathing. Pt denies that breast is hot to the touch. Denies nipple drainage. Pt states she can feel the lump. "It's really odd because it's almost down kind of where the chest wall is, you have to push down there,all around there it's painful." "I can almost grab it." Pain while pushing it is a 11/21/08. Drops down to 3-4/10 without touching it. Denies N/V.  RN asked pt to check her temperature on the phone. 99.60F.  Protocols used: Breast Symptoms-A-AH

## 2023-10-07 ENCOUNTER — Encounter: Payer: Self-pay | Admitting: Family Medicine

## 2023-10-07 ENCOUNTER — Other Ambulatory Visit: Payer: Self-pay | Admitting: Family Medicine

## 2023-10-07 ENCOUNTER — Ambulatory Visit (INDEPENDENT_AMBULATORY_CARE_PROVIDER_SITE_OTHER): Admitting: Family Medicine

## 2023-10-07 VITALS — BP 110/76 | HR 78 | Temp 99.0°F | Resp 20 | Ht 67.0 in | Wt 176.5 lb

## 2023-10-07 DIAGNOSIS — N644 Mastodynia: Secondary | ICD-10-CM | POA: Diagnosis not present

## 2023-10-07 DIAGNOSIS — N6320 Unspecified lump in the left breast, unspecified quadrant: Secondary | ICD-10-CM

## 2023-10-07 NOTE — Assessment & Plan Note (Signed)
 Firm, mobile, tender left breast area with pain radiating to armpit and shoulder. No nipple discharge or skin changes. Family history of breast cancer. Differential includes fibrocystic changes. Imaging needed to rule out malignancy. - Order diagnostic mammogram and ultrasound at [facility].

## 2023-10-07 NOTE — Progress Notes (Signed)
 SUBJECTIVE:   Chief Complaint  Patient presents with   Mass    Left top part of breast X 2 months   HPI Presents for acute visit  Discussed the use of AI scribe software for clinical note transcription with the patient, who gave verbal consent to proceed.  History of Present Illness Susan Adkins is a 33 year old female who presents with a tender hard spot on her left breast.  Approximately two months ago, she noticed a tender hard spot on her left breast while showering. The area is located at the top of the breast, feels very hard and lumpy with no clear borders, and is deep, requiring pressure to be felt. The pain radiates to her armpit and shoulder, lasting about 20 minutes after being touched. No nipple discharge, redness, or swelling, although her left breast has increased in size.  Her menstrual periods are irregular, occurring every four months. She has been on the same birth control for six years, taking it daily without breaks. She denies any recent trauma to the breast and has no personal history of breast cancer. However, her grandmother and cousin have had breast cancer.  She stopped breastfeeding six years ago but still has stretch marks in the area. No fever or recent illness. She drinks occasionally consumes energy drinks.    PERTINENT PMH / PSH: As above  OBJECTIVE:  BP 110/76   Pulse 78   Temp 99 F (37.2 C)   Resp 20   Ht 5\' 7"  (1.702 m)   Wt 176 lb 8 oz (80.1 kg)   SpO2 98%   BMI 27.64 kg/m    Physical Exam Vitals reviewed. Exam conducted with a chaperone present.  Constitutional:      General: She is not in acute distress.    Appearance: Normal appearance. She is normal weight. She is not ill-appearing, toxic-appearing or diaphoretic.  Eyes:     General:        Right eye: No discharge.        Left eye: No discharge.     Conjunctiva/sclera: Conjunctivae normal.  Cardiovascular:     Rate and Rhythm: Normal rate.  Pulmonary:     Effort:  Pulmonary effort is normal.  Chest:  Breasts:    Right: Normal.     Left: Tenderness present. No inverted nipple, nipple discharge or skin change.       Comments: Small mobile tender mass LUQ  Musculoskeletal:        General: Normal range of motion.  Skin:    General: Skin is warm and dry.  Neurological:     General: No focal deficit present.     Mental Status: She is alert and oriented to person, place, and time. Mental status is at baseline.  Psychiatric:        Mood and Affect: Mood normal.        Behavior: Behavior normal.        Thought Content: Thought content normal.        Judgment: Judgment normal.           10/07/2023    9:21 AM 07/29/2023    9:28 AM 07/29/2023    9:24 AM 07/01/2023    9:35 AM 03/26/2023   12:12 PM  Depression screen PHQ 2/9  Decreased Interest 1 0 0 0 1  Down, Depressed, Hopeless 0 0 0 1 0  PHQ - 2 Score 1 0 0 1 1  Altered sleeping 2 0 0 1 1  Tired, decreased energy 3 0 0 1 1  Change in appetite 0 0 0 0 0  Feeling bad or failure about yourself  0 0 0 2 0  Trouble concentrating 1 0 0 0 1  Moving slowly or fidgety/restless 0 0 0 0 0  Suicidal thoughts 0 0 0 0 0  PHQ-9 Score 7 0 0 5 4  Difficult doing work/chores Somewhat difficult Not difficult at all Not difficult at all Not difficult at all Somewhat difficult      10/07/2023    9:21 AM 07/29/2023    9:28 AM 07/29/2023    9:25 AM 07/01/2023    9:37 AM  GAD 7 : Generalized Anxiety Score  Nervous, Anxious, on Edge 3 0 0 1  Control/stop worrying 1 0 0 1  Worry too much - different things 1 0 0 1  Trouble relaxing 1 0 0 1  Restless 0 0 0 0  Easily annoyed or irritable 3 0 0 2  Afraid - awful might happen 0 0 0 0  Total GAD 7 Score 9 0 0 6  Anxiety Difficulty Somewhat difficult Not difficult at all Not difficult at all Not difficult at all    ASSESSMENT/PLAN:  Breast pain, left Assessment & Plan: Firm, mobile, tender left breast area with pain radiating to armpit and shoulder. No nipple  discharge or skin changes. Family history of breast cancer. Differential includes fibrocystic changes. Imaging needed to rule out malignancy. - Order diagnostic mammogram and ultrasound at [facility].  Orders: -     MM 3D DIAGNOSTIC MAMMOGRAM BILATERAL BREAST; Future    PDMP reviewed  Return if symptoms worsen or fail to improve, for PCP.  Valli Gaw, MD

## 2023-10-07 NOTE — Patient Instructions (Signed)
 It was a pleasure meeting you today. Thank you for allowing me to take part in your health care.  Our goals for today as we discussed include:  Referral sent for Diagnostic Mammogram. Please call to schedule appointment. [facility] [address] [place], [place] [address] [phone]    Follow up with PCP as needed    This is a list of the screening recommended for you and due dates:  Health Maintenance  Topic Date Due   COVID-19 Vaccine (3 - 2024-25 season) 02/16/2023   Flu Shot  01/16/2024   Pap with HPV screening  02/23/2027   DTaP/Tdap/Td vaccine (3 - Td or Tdap) 11/20/2028   Hepatitis C Screening  Completed   HIV Screening  Completed   HPV Vaccine  Aged Out   Meningitis B Vaccine  Aged Out     If you have any questions or concerns, please do not hesitate to call the office at [phone].  I look forward to our next visit and until then take care and stay safe.  Regards,   Valli Gaw, MD   [facility]

## 2023-10-10 ENCOUNTER — Ambulatory Visit
Admission: RE | Admit: 2023-10-10 | Discharge: 2023-10-10 | Disposition: A | Discharge status: Home / Self Care | Source: Ambulatory Visit | Attending: Family Medicine | Admitting: Family Medicine

## 2023-10-10 DIAGNOSIS — N644 Mastodynia: Secondary | ICD-10-CM | POA: Insufficient documentation

## 2023-10-10 DIAGNOSIS — N6325 Unspecified lump in the left breast, overlapping quadrants: Secondary | ICD-10-CM | POA: Diagnosis not present

## 2023-10-10 DIAGNOSIS — R92333 Mammographic heterogeneous density, bilateral breasts: Secondary | ICD-10-CM | POA: Diagnosis not present

## 2023-10-15 ENCOUNTER — Telehealth (INDEPENDENT_AMBULATORY_CARE_PROVIDER_SITE_OTHER): Admitting: Family

## 2023-10-15 ENCOUNTER — Encounter: Payer: Self-pay | Admitting: Family

## 2023-10-15 DIAGNOSIS — F32A Depression, unspecified: Secondary | ICD-10-CM

## 2023-10-15 DIAGNOSIS — G43801 Other migraine, not intractable, with status migrainosus: Secondary | ICD-10-CM | POA: Diagnosis not present

## 2023-10-15 DIAGNOSIS — G4700 Insomnia, unspecified: Secondary | ICD-10-CM | POA: Diagnosis not present

## 2023-10-15 DIAGNOSIS — G43709 Chronic migraine without aura, not intractable, without status migrainosus: Secondary | ICD-10-CM

## 2023-10-15 DIAGNOSIS — F419 Anxiety disorder, unspecified: Secondary | ICD-10-CM | POA: Diagnosis not present

## 2023-10-15 MED ORDER — PROPRANOLOL HCL ER 80 MG PO CP24: ORAL_CAPSULE | ORAL | 3 refills | Status: DC

## 2023-10-15 MED ORDER — VENLAFAXINE HCL ER 37.5 MG PO CP24: 37.5000 mg | ORAL_CAPSULE | Freq: Every morning | ORAL | 3 refills | Status: DC

## 2023-10-15 MED ORDER — ESZOPICLONE 2 MG PO TABS: 2.0000 mg | ORAL_TABLET | Freq: Every evening | ORAL | 2 refills | Status: DC | PRN

## 2023-10-15 MED ORDER — BUSPIRONE HCL 5 MG PO TABS: 5.0000 mg | ORAL_TABLET | Freq: Three times a day (TID) | ORAL | 1 refills | Status: DC | PRN

## 2023-10-15 MED ORDER — ONDANSETRON 4 MG PO TBDP: 4.0000 mg | ORAL_TABLET | Freq: Every day | ORAL | 1 refills | Status: AC | PRN

## 2023-10-15 NOTE — Progress Notes (Signed)
 Virtual Visit via Video Note  I connected with Susan Adkins on 10/16/23 at 11:00 AM EDT by a video enabled telemedicine application and verified that I am speaking with the correct person using two identifiers. Location patient: home Location provider: work  Persons participating in the virtual visit: patient, provider  I discussed the limitations of evaluation and management by telemedicine and the availability of in person appointments. The patient expressed understanding and agreed to proceed.  HPI: Overall feels well today.  No new complaints Increase Effexor  75 mg at previous visit. She self decreased to 37.5mg  due to headache.  She is no longer on Lexapro . Depressive symptoms have resolved; she is very pleased with effexor . She has some breakthrough anxiety  She has tried atarax  ( not effective) and buspar  the past.   HA are baseline. HA feel similar to HA.     Left breast US  is ordered to follow suspected benign fibroadenoma.    ROS: See pertinent positives and negatives per HPI.  EXAM:  VITALS per patient if applicable: There were no vitals taken for this visit. BP Readings from Last 3 Encounters:  10/07/23 110/76  03/26/23 106/70  09/30/22 110/72   Wt Readings from Last 3 Encounters:  10/07/23 176 lb 8 oz (80.1 kg)  07/29/23 172 lb 3.2 oz (78.1 kg)  07/01/23 172 lb 3.2 oz (78.1 kg)    GENERAL: alert, oriented, appears well and in no acute distress  HEENT: atraumatic, conjunttiva clear, no obvious abnormalities on inspection of external nose and ears  NECK: normal movements of the head and neck  LUNGS: on inspection no signs of respiratory distress, breathing rate appears normal, no obvious gross SOB, gasping or wheezing  CV: no obvious cyanosis  MS: moves all visible extremities without noticeable abnormality  PSYCH/NEURO: pleasant and cooperative, no obvious depression or anxiety, speech and thought processing grossly intact  ASSESSMENT AND  PLAN: Anxiety and depression Assessment & Plan: Improved. Continue Effexor  37.5 mg daily.  She is no longer on Lexapro .  Start BuSpar  5 mg 3 times daily as needed for breakthrough anxiety.   Chronic migraine without aura without status migrainosus, not intractable Assessment & Plan: Chronic, stable.  Continue propranolol  80 mg daily, Effexor  37.5 mg daily. consider nortriptyline at follow up.     Orders: -     Propranolol  HCl ER; TAKE 1 CAPSULE BY MOUTH EVERY DAY  Dispense: 90 capsule; Refill: 3  Insomnia, unspecified type -     Eszopiclone ; Take 1 tablet (2 mg total) by mouth at bedtime as needed for sleep. Take immediately before bedtime  Dispense: 30 tablet; Refill: 2  Other migraine with status migrainosus, not intractable Assessment & Plan: Chronic, stable.  Continue propranolol  80 mg daily, Effexor  37.5 mg daily. consider nortriptyline at follow up.     Orders: -     Ondansetron ; Take 1 tablet (4 mg total) by mouth daily as needed for nausea or vomiting.  Dispense: 30 tablet; Refill: 1  Other orders -     busPIRone  HCl; Take 1 tablet (5 mg total) by mouth 3 (three) times daily as needed.  Dispense: 60 tablet; Refill: 1 -     Venlafaxine  HCl ER; Take 1 capsule (37.5 mg total) by mouth every morning.  Dispense: 90 capsule; Refill: 3     -we discussed possible serious and likely etiologies, options for evaluation and workup, limitations of telemedicine visit vs in person visit, treatment, treatment risks and precautions. Pt prefers to treat via telemedicine empirically  rather then risking or undertaking an in person visit at this moment.    I discussed the assessment and treatment plan with the patient. The patient was provided an opportunity to ask questions and all were answered. The patient agreed with the plan and demonstrated an understanding of the instructions.   The patient was advised to call back or seek an in-person evaluation if the symptoms worsen or if the  condition fails to improve as anticipated.  Advised if desired AVS can be mailed or viewed via MyChart if Mychart user.   Bascom Bossier, FNP

## 2023-10-16 ENCOUNTER — Other Ambulatory Visit: Payer: Self-pay | Admitting: Family

## 2023-10-16 DIAGNOSIS — N6320 Unspecified lump in the left breast, unspecified quadrant: Secondary | ICD-10-CM

## 2023-10-16 NOTE — Assessment & Plan Note (Signed)
 Chronic, stable.  Continue propranolol  80 mg daily, Effexor  37.5 mg daily. consider nortriptyline at follow up.

## 2023-10-16 NOTE — Assessment & Plan Note (Signed)
 Improved. Continue Effexor  37.5 mg daily.  She is no longer on Lexapro .  Start BuSpar  5 mg 3 times daily as needed for breakthrough anxiety.

## 2023-10-16 NOTE — Patient Instructions (Signed)
 Start BuSpar  5 mg 3 times daily as needed for anxiety.  Very nice seeing you today!

## 2023-10-21 ENCOUNTER — Telehealth: Payer: 59 | Admitting: Family

## 2023-12-26 ENCOUNTER — Other Ambulatory Visit: Payer: Self-pay | Admitting: Family

## 2023-12-26 DIAGNOSIS — S39012A Strain of muscle, fascia and tendon of lower back, initial encounter: Secondary | ICD-10-CM

## 2024-01-09 ENCOUNTER — Telehealth (INDEPENDENT_AMBULATORY_CARE_PROVIDER_SITE_OTHER): Admitting: Family

## 2024-01-09 ENCOUNTER — Ambulatory Visit: Admitting: Family

## 2024-01-09 VITALS — HR 85 | Ht 62.0 in | Wt 175.0 lb

## 2024-01-09 DIAGNOSIS — S0300XA Dislocation of jaw, unspecified side, initial encounter: Secondary | ICD-10-CM | POA: Diagnosis not present

## 2024-01-09 DIAGNOSIS — F419 Anxiety disorder, unspecified: Secondary | ICD-10-CM | POA: Diagnosis not present

## 2024-01-09 DIAGNOSIS — M26629 Arthralgia of temporomandibular joint, unspecified side: Secondary | ICD-10-CM

## 2024-01-09 DIAGNOSIS — F32A Depression, unspecified: Secondary | ICD-10-CM

## 2024-01-09 MED ORDER — CYCLOBENZAPRINE HCL 5 MG PO TABS: 5.0000 mg | ORAL_TABLET | Freq: Every day | ORAL | 1 refills | Status: DC | PRN

## 2024-01-09 MED ORDER — PREDNISONE 10 MG PO TABS: ORAL_TABLET | ORAL | 0 refills | Status: DC

## 2024-01-09 NOTE — Progress Notes (Signed)
 Virtual Visit via Video Note  I connected with Susan Adkins on 01/11/24 at 11:00 AM EDT by a video enabled telemedicine application and verified that I am speaking with the correct person using two identifiers. Location patient: home Location provider: work  Persons participating in the virtual visit: patient, provider  I discussed the limitations of evaluation and management by telemedicine and the availability of in person appointments. The patient expressed understanding and agreed to proceed.  HPI: She complains of bilateral jaw pain for 4-5 weeks and now over the past couple of weeks, worsening.  She has pain 'everytime' she chews or even at times with speaking/reading outloud. She can hear and feel her jaw on the right side with 'popping' with even opening her mouth to drink coffee. For a 'split second' the popping may relieve the pain and then pain radiates.   Mouth opening will be small.  She feels like lower jaw is moving.  Pain when she wakes up and goes to sleep.  No trauma.  Denies ear pain, tinnitus, teeth grinding, neck pain.   She has been focused on mouth posture and relaxing her muscles of her jaw. She feels like jaw muscles 'want to stay tight'. Very temporary relief of tylenol  and advil . No relief naprosyn .    H/o EDS  She has seen her dentist and told no dental issues, no cavities.   She has increased to effexor  75mg   and she is feeling much better. HA are not increased. She feels well on propranolol  80mg    She has been on prednisone  in the past and tolerated medication.   ROS: See pertinent positives and negatives per HPI.  EXAM:  VITALS per patient if applicable: Pulse 85   Ht 5' 2 (1.575 m)   Wt 175 lb (79.4 kg)   BMI 32.01 kg/m  BP Readings from Last 3 Encounters:  10/07/23 110/76  03/26/23 106/70  09/30/22 110/72   Wt Readings from Last 3 Encounters:  01/09/24 175 lb (79.4 kg)  10/07/23 176 lb 8 oz (80.1 kg)  07/29/23 172 lb 3.2 oz (78.1 kg)     GENERAL: alert, oriented, appears well and in no acute distress  HEENT: atraumatic, conjunttiva clear, no obvious abnormalities on inspection of external nose and ears  NECK: normal movements of the head and neck  LUNGS: on inspection no signs of respiratory distress, breathing rate appears normal, no obvious gross SOB, gasping or wheezing  CV: no obvious cyanosis  MS: moves all visible extremities without noticeable abnormality  PSYCH/NEURO: pleasant and cooperative, no obvious depression or anxiety, speech and thought processing grossly intact  ASSESSMENT AND PLAN: TMJ (dislocation of temporomandibular joint), initial encounter -     predniSONE ; Take 4 tablets ( total 40 mg) by mouth for 2 days; take 3 tablets ( total 30 mg) by mouth for 2 days; take 2 tablets ( total 20 mg) by mouth for 1 day; take 1 tablet ( total 10 mg) by mouth for 1 day.  Dispense: 17 tablet; Refill: 0 -     Cyclobenzaprine  HCl; Take 1-2 tablets (5-10 mg total) by mouth daily as needed for muscle spasms.  Dispense: 30 tablet; Refill: 1  TMJ pain dysfunction syndrome Assessment & Plan: Presentation over virtual consistent with TMJ.  Appears acutely inflamed.  Start prednisone  taper.  Provided her with Flexeril  to use as well.  Discussed side effects of prednisone . Consider ENT referral, imaging if persists.    Anxiety and depression Assessment & Plan: Improved. Continue  Effexor  75 mg daily.  She is no longer on Lexapro .        -we discussed possible serious and likely etiologies, options for evaluation and workup, limitations of telemedicine visit vs in person visit, treatment, treatment risks and precautions. Pt prefers to treat via telemedicine empirically rather then risking or undertaking an in person visit at this moment.    I discussed the assessment and treatment plan with the patient. The patient was provided an opportunity to ask questions and all were answered. The patient agreed with the plan  and demonstrated an understanding of the instructions.   The patient was advised to call back or seek an in-person evaluation if the symptoms worsen or if the condition fails to improve as anticipated.  Advised if desired AVS can be mailed or viewed via MyChart if Mychart user.   Rollene Northern, FNP

## 2024-01-11 NOTE — Assessment & Plan Note (Signed)
 Improved. Continue Effexor  75 mg daily.  She is no longer on Lexapro .

## 2024-01-11 NOTE — Assessment & Plan Note (Signed)
 Presentation over virtual consistent with TMJ.  Appears acutely inflamed.  Start prednisone  taper.  Provided her with Flexeril  to use as well.  Discussed side effects of prednisone . Consider ENT referral, imaging if persists.

## 2024-01-11 NOTE — Patient Instructions (Signed)
 Start prednisone , Flexeril  as discussed.  Please let me know how you are doing

## 2024-01-15 ENCOUNTER — Encounter: Payer: Self-pay | Admitting: Family

## 2024-01-16 ENCOUNTER — Other Ambulatory Visit: Payer: Self-pay | Admitting: Family

## 2024-01-16 DIAGNOSIS — S0300XA Dislocation of jaw, unspecified side, initial encounter: Secondary | ICD-10-CM

## 2024-01-16 MED ORDER — METHOCARBAMOL 500 MG PO TABS: 500.0000 mg | ORAL_TABLET | Freq: Three times a day (TID) | ORAL | 0 refills | Status: AC | PRN

## 2024-01-23 ENCOUNTER — Other Ambulatory Visit: Payer: Self-pay | Admitting: Family

## 2024-01-23 MED ORDER — METHOCARBAMOL 500 MG PO TABS: 500.0000 mg | ORAL_TABLET | Freq: Three times a day (TID) | ORAL | 1 refills | Status: DC | PRN

## 2024-01-24 ENCOUNTER — Other Ambulatory Visit: Payer: Self-pay | Admitting: Family

## 2024-01-24 DIAGNOSIS — S39012A Strain of muscle, fascia and tendon of lower back, initial encounter: Secondary | ICD-10-CM

## 2024-01-26 ENCOUNTER — Other Ambulatory Visit: Payer: Self-pay | Admitting: Family

## 2024-01-26 DIAGNOSIS — M26629 Arthralgia of temporomandibular joint, unspecified side: Secondary | ICD-10-CM

## 2024-01-29 ENCOUNTER — Ambulatory Visit (INDEPENDENT_AMBULATORY_CARE_PROVIDER_SITE_OTHER)

## 2024-01-29 ENCOUNTER — Ambulatory Visit: Payer: Self-pay

## 2024-01-29 VITALS — BP 110/80 | HR 94 | Temp 99.1°F | Ht 62.0 in | Wt 177.8 lb

## 2024-01-29 DIAGNOSIS — M26629 Arthralgia of temporomandibular joint, unspecified side: Secondary | ICD-10-CM

## 2024-01-29 NOTE — Assessment & Plan Note (Signed)
 D/D TMJ dysfunction with referred pain, trigeminal autonomic cephalgias, migraine. Diet: soft diet, jaw rest, warm compresses, avoidance of wide mouth opening, stress reduction.  Referral to dentist today for further evaluation. Has appointment on 02/04/24. Follow up with PCP in 3 weeks and consider referral for cognitive behavioral therapy, consider neurology evaluation, mri head if pain persists.  Take Ibuprofen  400 mg every 8 hourly as needed with food. You can add Tylenol  500 mg every 8 hourly as well.

## 2024-01-29 NOTE — Telephone Encounter (Signed)
 FYI Only or Action Required?: Action required by provider: request for appointment.  Patient was last seen in primary care on 01/09/2024 by Dineen Rollene MATSU, FNP.  Called Nurse Triage reporting Temporomandibular Joint Pain.  Symptoms began a week ago.  Interventions attempted: Nothing.  Symptoms are: gradually worsening.  Triage Disposition: See HCP Within 4 Hours (Or PCP Triage)  Patient/caregiver understands and will follow disposition?: Yes, will follow disposition  Copied from CRM [id number]. Topic: Clinical - Red Word Triage >> Jan 29, 2024  8:13 AM [place] A wrote: [facility] that prompted transfer to Nurse Triage: Patient is having lock jaw since last week. Pain is a 7 or 8 Reason for Disposition  [1] SEVERE pain (e.g., excruciating) AND [2] not improved 2 hours after pain medicine/ice packs  Answer Assessment - Initial Assessment Questions 1. MECHANISM: How did the injury happen?      States that jaw had been clicking and now has locked on the R side 2. ONSET: When did the injury happen? (e.g., minutes, hours ago)      Locks each morning, sometimes can make it open sometimes cannot, states ongoing for at least a week 7. PAIN: Is it painful? If Yes, ask: How bad is the pain?  (Scale 0-10; or none, mild, moderate, severe)     8  Pt is scheduled to see MD Bair today at 0900. Pt also requesting that her PCP be asked if she would be able to see her today at all. Pt advised that no appts were available with PCP  Protocols used: Mouth Injury-A-AH

## 2024-01-29 NOTE — Patient Instructions (Addendum)
 Diet: soft diet, jaw rest, warm compresses, avoidance of wide mouth opening, stress reduction.  Referral to dentist today for further evaluation.  Follow up with PCP in 3 weeks and consider referral for cognitive behavioral therapy.  If pain is significant take Ibuprofen  400 mg every 8 hourly as needed with food. You can add Tylenol  500 mg every 8 hourly as well.

## 2024-01-29 NOTE — Progress Notes (Signed)
 Acute Office Visit  Subjective:    Patient ID: Susan Adkins, female    DOB: 1991-03-16, 33 y.o.   MRN: [id number]  Chief Complaint  Patient presents with   Temporomandibular Joint Pain   HPI Discussed the use of AI scribe software for clinical note transcription with the patient, who gave verbal consent to proceed.  History of Present Illness Susan Adkins is a 33 year old female with Ehlers-Danlos Syndrome who presents with right-sided jaw pain and facial symptoms.   - Right jaw pain that started about 3 months ago. Reports symptoms of clicking sensation with intermittent locking causing limited opening of mouth at times on right side.  Associated symptoms headache on the right side, right ear pain, right facial pain, difficulty chewing, jaw fatigue on the right side as well.   - Previous treatments have included NSAIDs (Ibuprofen /Naproxen  did not help), prednisone  (4 days course, and muscle relaxants with some improvement in symptoms which came back after finishing the treatment.  Muscle relaxants like Robaxin  caused panic attacks, leading her to discontinue them. She notes an increase in facial pain after stopping Robaxin . She is currently taking ibuprofen  and Tylenol  intermittently, but is cautious about overuse due to potential side effects.  - Her past medical history includes Ehlers-Danlos Syndrome, diagnosed in childhood.   - She has been consuming a soft diet, including smoothies and mac and cheese, to minimize jaw strain.  - No fevers, chills, or vision changes. Reports new hot flashes. No dry eyes. Temperature around 99.65F.   ROS As per HPI    Objective:    BP 110/80 (BP Location: Right Arm, Patient Position: Sitting, Cuff Size: Normal)   Pulse 94   Temp 99.1 F (37.3 C) (Oral)   Ht 5' 2 (1.575 m)   Wt 177 lb 12.8 oz (80.6 kg)   SpO2 99%   BMI 32.52 kg/m    Physical Exam HENT:     Head:     Comments: Face/TMJ exam:  Intact facial symmetry  No s  welling/erythema over the TMJ bilaterally.  + Tenderness on palpation of right TMJ  No obvious mass felt on exam.  During today's exam no crepitus or clicking with the jaw movement. She does have reduced ROM interincisal opening due to pain with opening of mouth.        Right Ear: Tympanic membrane and external ear normal.     Left Ear: Tympanic membrane and external ear normal.  Eyes:     General: No scleral icterus.    Extraocular Movements: Extraocular movements intact.     Right eye: Normal extraocular motion.     Left eye: Normal extraocular motion.     Conjunctiva/sclera:     Right eye: No chemosis.    Left eye: No chemosis.    Pupils: Pupils are equal, round, and reactive to light.     Comments: B/L pupil equal and reactive to light   Cardiovascular:     Rate and Rhythm: Normal rate.  Musculoskeletal:     Cervical back: Normal range of motion. No tenderness.  Lymphadenopathy:     Cervical: No cervical adenopathy.  Neurological:     Mental Status: She is alert and oriented to person, place, and time.     Cranial Nerves: Cranial nerves 2-12 are intact.  Psychiatric:        Mood and Affect: Affect is tearful (talking about ongoing TMJ pain, locking).        No results found  for any visits on 01/29/24.     Assessment & Plan:  TMJ pain dysfunction syndrome Assessment & Plan: D/D TMJ dysfunction with referred pain, trigeminal autonomic cephalgias, migraine. Diet: soft diet, jaw rest, warm compresses, avoidance of wide mouth opening, stress reduction.  Referral to dentist today for further evaluation. Has appointment on 02/04/24. Follow up with PCP in 3 weeks and consider referral for cognitive behavioral therapy, consider neurology evaluation, mri head if pain persists.  Take Ibuprofen  400 mg every 8 hourly as needed with food. You can add Tylenol  500 mg every 8 hourly as well.   Orders: -     Ambulatory referral to Dentistry    Return in about 3 weeks (around  02/19/2024).  Luke Shade, MD

## 2024-01-29 NOTE — Telephone Encounter (Signed)
 Noted  Seen by colleague dr abbey

## 2024-02-10 ENCOUNTER — Encounter: Payer: Self-pay | Admitting: Family

## 2024-02-10 ENCOUNTER — Ambulatory Visit
Admission: RE | Admit: 2024-02-10 | Discharge: 2024-02-10 | Disposition: A | Discharge status: Home / Self Care | Source: Ambulatory Visit | Attending: Family | Admitting: Family

## 2024-02-10 ENCOUNTER — Ambulatory Visit
Admission: RE | Admit: 2024-02-10 | Discharge: 2024-02-10 | Disposition: A | Discharge status: Home / Self Care | Attending: Family | Admitting: Family

## 2024-02-10 ENCOUNTER — Ambulatory Visit (INDEPENDENT_AMBULATORY_CARE_PROVIDER_SITE_OTHER): Admitting: Family

## 2024-02-10 VITALS — BP 130/76 | HR 94 | Temp 98.8°F | Ht 62.0 in | Wt 177.6 lb

## 2024-02-10 DIAGNOSIS — R591 Generalized enlarged lymph nodes: Secondary | ICD-10-CM | POA: Diagnosis not present

## 2024-02-10 DIAGNOSIS — M26629 Arthralgia of temporomandibular joint, unspecified side: Secondary | ICD-10-CM

## 2024-02-10 DIAGNOSIS — M26603 Bilateral temporomandibular joint disorder, unspecified: Secondary | ICD-10-CM | POA: Diagnosis not present

## 2024-02-10 MED ORDER — CYCLOBENZAPRINE HCL 10 MG PO TABS: 10.0000 mg | ORAL_TABLET | Freq: Every evening | ORAL | 2 refills | Status: DC | PRN

## 2024-02-10 NOTE — Patient Instructions (Signed)
 If you continue to feel lymph node , we will get CT soft tissue.   Continue flexeril    Take mobic  for a few more with foods and then start to take as needed.   A couple of points in regards to meloxicam  ( Mobic ) -  This medication is not intended for daily , long term use. It is a potent anti inflammatory ( NSAID), and my intention is for you take as needed for moderate to severe pain. If you find yourself using daily, please let me know.   Please takes Mobic  ( meloxicam ) with FOOD since it is an anti-inflammatory as it can cause a GI bleed or ulcer. If you have a history of GI bleed or ulcer, please do NOT take.  Do no take over the counter aleve , motrin , advil , goody's powder for pain as they are also NSAIDs, and they are  in the same class as Mobic   Lastly, we will need to monitor kidney function while on Mobic , and if we were to see any decline in kidney function in the future, we would have to discontinue this medication.

## 2024-02-10 NOTE — Progress Notes (Unsigned)
   Assessment & Plan:  There are no diagnoses linked to this encounter.   Return precautions given.   Risks, benefits, and alternatives of the medications and treatment plan prescribed today were discussed, and patient expressed understanding.   Education regarding symptom management and diagnosis given to patient on AVS either electronically or printed.  No follow-ups on file.  Rollene Northern, FNP  Subjective:    Patient ID: Susan Adkins, female    DOB: Feb 28, 1991, 33 y.o.   MRN: [id number]  CC: Susan Adkins is a 33 y.o. female who presents today for follow up.   HPI: HPI Accompanied by by two chuldren  Follow up TMJ   Seen by Dr Glinda [facility] 8/ 20/25  Seen by Dr Abbey 01/29/24  Allergies: Codeine  and Wellbutrin  [bupropion ] Current Outpatient Medications on File Prior to Visit  Medication Sig Dispense Refill  . Cyanocobalamin  (VITAMIN B 12 PO) Take 1 capsule by mouth daily.    . eszopiclone  (LUNESTA ) 2 MG TABS tablet Take 1 tablet (2 mg total) by mouth at bedtime as needed for sleep. Take immediately before bedtime 30 tablet 2  . gabapentin  (NEURONTIN ) 300 MG capsule TAKE 2 CAPSULES BY MOUTH AT BEDTIME 60 capsule 0  . Levonorgestrel-Ethinyl Estradiol  (AMETHIA) 0.1-0.02 & 0.01 MG tablet Take 1 tablet by mouth daily. 91 tablet 4  . levothyroxine  (SYNTHROID ) 150 MCG tablet Take 1 tablet (150 mcg total) by mouth daily. 90 tablet 3  . ondansetron  (ZOFRAN -ODT) 4 MG disintegrating tablet Take 1 tablet (4 mg total) by mouth daily as needed for nausea or vomiting. 30 tablet 1  . propranolol  ER (INDERAL  LA) 80 MG 24 hr capsule TAKE 1 CAPSULE BY MOUTH EVERY DAY 90 capsule 3  . venlafaxine  XR (EFFEXOR -XR) 75 MG 24 hr capsule Take 75 mg by mouth every morning.     No current facility-administered medications on file prior to visit.    Review of Systems    Objective:    There were no vitals taken for this visit. BP Readings from Last 3 Encounters:  01/29/24  110/80  10/07/23 110/76  03/26/23 106/70   Wt Readings from Last 3 Encounters:  01/29/24 177 lb 12.8 oz (80.6 kg)  01/09/24 175 lb (79.4 kg)  10/07/23 176 lb 8 oz (80.1 kg)    Physical Exam

## 2024-02-12 DIAGNOSIS — R591 Generalized enlarged lymph nodes: Secondary | ICD-10-CM | POA: Insufficient documentation

## 2024-02-12 NOTE — Assessment & Plan Note (Signed)
 Suspected Reactive right facial lymphadenopathy   A movable right cheek lymph node is likely reactive. Monitor the lymph node for changes and we discussed pursuing CT soft tissue head and neck if it persists. Close follow up.

## 2024-02-12 NOTE — Assessment & Plan Note (Addendum)
 Acute, subtle improvement.  Discussed correlation with Ehlers-Danlos syndrome.  Discussed consult with [facility].  Order bilateral TMJ x-rays. Continue Flexeril  10 mg at night and meloxicam  15 mg daily with food.  Counseled on importance to take meloxicam  with food and to avoid long-term use.  Refilled today.  Discuss potential for trigger point injections, PT.  Consider second opinion with [facility] or [facility].

## 2024-02-22 ENCOUNTER — Other Ambulatory Visit: Payer: Self-pay | Admitting: Family

## 2024-02-22 DIAGNOSIS — S39012A Strain of muscle, fascia and tendon of lower back, initial encounter: Secondary | ICD-10-CM

## 2024-02-23 ENCOUNTER — Ambulatory Visit: Admitting: Family

## 2024-02-24 ENCOUNTER — Ambulatory Visit: Payer: Self-pay | Admitting: Family

## 2024-03-02 ENCOUNTER — Other Ambulatory Visit: Payer: Self-pay | Admitting: Family

## 2024-03-02 ENCOUNTER — Encounter: Payer: Self-pay | Admitting: Family

## 2024-03-02 DIAGNOSIS — M26629 Arthralgia of temporomandibular joint, unspecified side: Secondary | ICD-10-CM

## 2024-03-03 ENCOUNTER — Encounter: Payer: Self-pay | Admitting: Family

## 2024-03-07 ENCOUNTER — Other Ambulatory Visit: Payer: Self-pay | Admitting: Family

## 2024-03-07 DIAGNOSIS — G4700 Insomnia, unspecified: Secondary | ICD-10-CM

## 2024-03-08 ENCOUNTER — Telehealth: Payer: Self-pay | Admitting: Family

## 2024-03-08 NOTE — Telephone Encounter (Signed)
 Sent to Rasheedah to have her look into this for me waiting on response

## 2024-03-08 NOTE — Telephone Encounter (Signed)
 Copied from CRM #[id number]. Topic: General - Other >> Mar 08, 2024  9:00 AM Martinique E wrote: Reason for CRM: Patient has her MRI and tomorrow and got a call from insurance saying that her PA was denied. Patient questioning if PCP can re-submit this PA and include that patient has been doing treatment for at least 6 weeks. Please call patient and advise with next steps.

## 2024-03-09 ENCOUNTER — Ambulatory Visit

## 2024-03-09 NOTE — Telephone Encounter (Signed)
 MRI was cancelled-requesting follow-up on this

## 2024-03-10 ENCOUNTER — Encounter: Payer: Self-pay | Admitting: Family

## 2024-03-10 NOTE — Telephone Encounter (Signed)
 The reconsideration was not approved due to no surgical indication needs to have a surgical plan for it to approve. It has to have surgical planning. This would need to be requested by a oral surgeon with need for oral surgery.

## 2024-03-18 NOTE — Telephone Encounter (Signed)
 Called [facility] they are going to fax over ov notes

## 2024-03-20 ENCOUNTER — Other Ambulatory Visit: Payer: Self-pay | Admitting: Family

## 2024-03-20 DIAGNOSIS — E063 Autoimmune thyroiditis: Secondary | ICD-10-CM

## 2024-03-24 ENCOUNTER — Telehealth: Payer: Self-pay | Admitting: Family

## 2024-03-24 DIAGNOSIS — M26629 Arthralgia of temporomandibular joint, unspecified side: Secondary | ICD-10-CM

## 2024-03-24 NOTE — Telephone Encounter (Signed)
 Rollene I came across another pt referral to see PT for TMJ at [facility]  [facility] [address] [place] [address] Ph [phone] Fx [phone] Reason: TMJ   Do you mind placing a referral to PT? Please advise and Thank you!

## 2024-03-24 NOTE — Telephone Encounter (Signed)
 Lft pt vm to call ofc about possibly seeing PT for TMJ.   Ami Sims  [facility] [address] [place] [address] [place] [address] Ph [phone] Fx [phone] Reason: TMJ

## 2024-03-26 ENCOUNTER — Ambulatory Visit: Attending: Family

## 2024-03-26 ENCOUNTER — Ambulatory Visit (INDEPENDENT_AMBULATORY_CARE_PROVIDER_SITE_OTHER): Admitting: Family

## 2024-03-26 ENCOUNTER — Encounter: Payer: Self-pay | Admitting: Family

## 2024-03-26 VITALS — BP 124/84 | HR 87 | Temp 97.8°F | Ht 62.0 in | Wt 173.6 lb

## 2024-03-26 DIAGNOSIS — Z0001 Encounter for general adult medical examination with abnormal findings: Secondary | ICD-10-CM

## 2024-03-26 DIAGNOSIS — Z0000 Encounter for general adult medical examination without abnormal findings: Secondary | ICD-10-CM

## 2024-03-26 DIAGNOSIS — R002 Palpitations: Secondary | ICD-10-CM | POA: Diagnosis not present

## 2024-03-26 DIAGNOSIS — E162 Hypoglycemia, unspecified: Secondary | ICD-10-CM

## 2024-03-26 LAB — COMPREHENSIVE METABOLIC PANEL WITH GFR
ALT: 34 U/L (ref 0–35)
AST: 19 U/L (ref 0–37)
Albumin: 4.3 g/dL (ref 3.5–5.2)
Alkaline Phosphatase: 103 U/L (ref 39–117)
BUN: 12 mg/dL (ref 6–23)
CO2: 24 meq/L (ref 19–32)
Calcium: 8.9 mg/dL (ref 8.4–10.5)
Chloride: 104 meq/L (ref 96–112)
Creatinine, Ser: 0.81 mg/dL (ref 0.40–1.20)
GFR: 95.93 mL/min (ref >60.00)
Glucose, Bld: 88 mg/dL (ref 70–99)
Potassium: 4.3 meq/L (ref 3.5–5.1)
Sodium: 138 meq/L (ref 135–145)
Total Bilirubin: 0.4 mg/dL (ref 0.2–1.2)
Total Protein: 7 g/dL (ref 6.0–8.3)

## 2024-03-26 LAB — CBC WITH DIFFERENTIAL/PLATELET
Basophils Absolute: 0 K/uL (ref 0.0–0.1)
Basophils Relative: 0.5 % (ref 0.0–3.0)
Eosinophils Absolute: 0 K/uL (ref 0.0–0.7)
Eosinophils Relative: 0.4 % (ref 0.0–5.0)
HCT: 44 % (ref 36.0–46.0)
Hemoglobin: 14.6 g/dL (ref 12.0–15.0)
Lymphocytes Relative: 31.3 % (ref 12.0–46.0)
Lymphs Abs: 1.7 K/uL (ref 0.7–4.0)
MCHC: 33.3 g/dL (ref 30.0–36.0)
MCV: 90.8 fl (ref 78.0–100.0)
Monocytes Absolute: 0.3 K/uL (ref 0.1–1.0)
Monocytes Relative: 6 % (ref 3.0–12.0)
Neutro Abs: 3.4 K/uL (ref 1.4–7.7)
Neutrophils Relative %: 61.8 % (ref 43.0–77.0)
Platelets: 266 K/uL (ref 150.0–400.0)
RBC: 4.85 Mil/uL (ref 3.87–5.11)
RDW: 11.7 % (ref 11.5–15.5)
WBC: 5.5 K/uL (ref 4.0–10.5)

## 2024-03-26 LAB — HEMOGLOBIN A1C: Hgb A1c MFr Bld: 5.4 % (ref 4.6–6.5)

## 2024-03-26 LAB — VITAMIN D 25 HYDROXY (VIT D DEFICIENCY, FRACTURES): VITD: 30.47 ng/mL (ref 30.00–100.00)

## 2024-03-26 LAB — LIPID PANEL
Cholesterol: 180 mg/dL (ref 0–200)
HDL: 50.6 mg/dL (ref >39.00)
LDL Cholesterol: 100 mg/dL — ABNORMAL HIGH (ref 0–99)
NonHDL: 129.26
Total CHOL/HDL Ratio: 4
Triglycerides: 146 mg/dL (ref 0.0–149.0)
VLDL: 29.2 mg/dL (ref 0.0–40.0)

## 2024-03-26 LAB — B12 AND FOLATE PANEL
Folate: >22.9 ng/mL (ref >5.9)
Vitamin B-12: 649 pg/mL (ref 211–911)

## 2024-03-26 LAB — T4, FREE: Free T4: 1.4 ng/dL (ref 0.60–1.60)

## 2024-03-26 LAB — T3, FREE: T3, Free: 3.8 pg/mL (ref 2.3–4.2)

## 2024-03-26 NOTE — Telephone Encounter (Signed)
 Ref to PT placed  Any update after faxing oral surgeon note regarding MRI TMJ?

## 2024-03-26 NOTE — Progress Notes (Signed)
 Assessment & Plan:  Routine physical examination Assessment & Plan: Deferred clinical breast exam due to patient preference.  Encouraged more formal exercise program.  Deferred pelvic exam in the absence of complaints and Pap smear is up-to-date.   Hypoglycemia Assessment & Plan: Patient consistent with symptomatic hypoglycemia.  Given patient sample of libre CGM monitor.  Discussed carbohydrate protein combination and eating every 2-3 hours.  Adequate hydration.  Pending labs  Orders: -     T3, free -     T4, free -     CBC with Differential/Platelet -     Comprehensive metabolic panel with GFR -     Hemoglobin A1c -     VITAMIN D  25 Hydroxy (Vit-D Deficiency, Fractures) -     B12 and Folate Panel -     PCOS Diagnostic Profile -     Insulin, random -     Lipid panel -     Cortisol-am, blood; Future  Palpitations Assessment & Plan: Chronic, episodic.  She is also having symptoms of hypoglycemia.  Pending zio monitor.  Continue propranolol  80 mg daily for now for management of migraine/anxiety.  Orders: -     LONG TERM MONITOR (3-14 DAYS); Future     Return precautions given.   Risks, benefits, and alternatives of the medications and treatment plan prescribed today were discussed, and patient expressed understanding.   Education regarding symptom management and diagnosis given to patient on AVS either electronically or printed.  Return in about 3 months (around 06/26/2024).  Rollene Northern, FNP  Subjective:    Patient ID: Joesph CHRISTELLA Jaeger, female    DOB: [birth year], 33 y.o.   MRN: [id number]  CC: SAMREEN SELTZER is a 33 y.o. female who presents today for physical exam.    HPI: She would like hormone evaluation.  Menses are irregular. She endorses hair on abdomen.  She has episodes of feeling shakey and weak; she will eat and symptoms will resolve. She worries her blood sugar may be low.  She is eating healthier now, meal prepping, and combining protein and carb.    She complains of episodic of palpitions, HR on her watch in the 140's, few minutes and then resolves. Since 'she can remember' she will feel dizzy with standing however she has never 'passed out.' Denies cp, sob, syncope, dizziness.   No sudafed, caffeine.    Compliant with propranolol .    H/o total thyroidectomy  Colorectal Cancer Screening: Mother with h/o rectal cancer Breast Cancer Screening: No FDR with breast cancer. MGM and cousin with breast cancer Cervical Cancer Screening: UTD, 02/22/22 neg HPV, NILM         Tetanus - UTD       Exercise: No formal exercise aside from taking care of children.   Alcohol use:  not currently Smoking/tobacco use: Nonsmoker.    Health Maintenance  Topic Date Due   Hepatitis B Vaccine (1 of 3 - 19+ 3-dose series) Never done   HPV Vaccine (1 - 3-dose SCDM series) Never done   COVID-19 Vaccine (3 - 2025-26 season) 04/11/2024*   Flu Shot  09/14/2024*   Pap with HPV screening  02/23/2027   DTaP/Tdap/Td vaccine (3 - Td or Tdap) 11/20/2028   Hepatitis C Screening  Completed   HIV Screening  Completed   Pneumococcal Vaccine  Aged Out   Meningitis B Vaccine  Aged Out  *Topic was postponed. The date shown is not the original due date.    ALLERGIES: Codeine , Robaxin  [  methocarbamol ], and Wellbutrin  [bupropion ]  Current Outpatient Medications on File Prior to Visit  Medication Sig Dispense Refill   Cyanocobalamin  (VITAMIN B 12 PO) Take 1 capsule by mouth daily.     cyclobenzaprine  (FLEXERIL ) 10 MG tablet Take 1 tablet (10 mg total) by mouth at bedtime as needed for muscle spasms. 30 tablet 2   eszopiclone  (LUNESTA ) 2 MG TABS tablet TAKE 1 TABLET (2 MG TOTAL) BY MOUTH IMMEDIATELY BEFORE BEDTIME AS NEEDED FOR SLEEP 30 tablet 2   Levonorgestrel-Ethinyl Estradiol  (AMETHIA) 0.1-0.02 & 0.01 MG tablet Take 1 tablet by mouth daily. 91 tablet 4   levothyroxine  (SYNTHROID ) 150 MCG tablet TAKE 1 TABLET BY MOUTH EVERY DAY 90 tablet 3   meloxicam  (MOBIC )  15 MG tablet Take 15 mg by mouth daily.     ondansetron  (ZOFRAN -ODT) 4 MG disintegrating tablet Take 1 tablet (4 mg total) by mouth daily as needed for nausea or vomiting. 30 tablet 1   propranolol  ER (INDERAL  LA) 80 MG 24 hr capsule TAKE 1 CAPSULE BY MOUTH EVERY DAY 90 capsule 3   venlafaxine  XR (EFFEXOR -XR) 75 MG 24 hr capsule Take 75 mg by mouth every morning.     No current facility-administered medications on file prior to visit.    Review of Systems  Constitutional:  Negative for chills and fever.  Respiratory:  Negative for cough and shortness of breath.   Cardiovascular:  Positive for palpitations. Negative for chest pain and leg swelling.  Gastrointestinal:  Negative for nausea and vomiting.  Neurological:  Negative for syncope.      Objective:    BP 124/84   Pulse 87   Temp 97.8 F (36.6 C) (Oral)   Ht 5' 2 (1.575 m)   Wt 173 lb 9.6 oz (78.7 kg)   SpO2 98%   BMI 31.75 kg/m   BP Readings from Last 3 Encounters:  03/30/24 130/78  03/26/24 124/84  02/10/24 130/76   Wt Readings from Last 3 Encounters:  03/30/24 174 lb 9.6 oz (79.2 kg)  03/26/24 173 lb 9.6 oz (78.7 kg)  02/10/24 177 lb 9.6 oz (80.6 kg)    Physical Exam Vitals reviewed.  Constitutional:      Appearance: She is well-developed.  Eyes:     Conjunctiva/sclera: Conjunctivae normal.  Cardiovascular:     Rate and Rhythm: Normal rate and regular rhythm.     Pulses: Normal pulses.     Heart sounds: Normal heart sounds.  Pulmonary:     Effort: Pulmonary effort is normal.     Breath sounds: Normal breath sounds. No wheezing, rhonchi or rales.  Lymphadenopathy:     Head:     Right side of head: No submental, submandibular, tonsillar, preauricular, posterior auricular or occipital adenopathy.     Left side of head: No submental, submandibular, tonsillar, preauricular, posterior auricular or occipital adenopathy.     Cervical: No cervical adenopathy.  Skin:    General: Skin is warm and dry.   Neurological:     Mental Status: She is alert.  Psychiatric:        Speech: Speech normal.        Behavior: Behavior normal.        Thought Content: Thought content normal.

## 2024-03-26 NOTE — Patient Instructions (Signed)
 I have ordered a 14 day Zio monitor which will mailed directly to you. The device will include instructions on how to apply.   The Zio 14 day monitor that we use DOES NOT have 24 hour live monitoring. The rhythm of your heart will not be monitored while you are wearing it. Only AFTER you mail the device back in and a cardiologist interprets the data will we know if an underlying cardiac arrhythmia is going on.   There are models that offer 24 hour live monitoring however NOT this one. I wanted to be sure that you were aware of this as certainly didn't want this to be a false sense of security.   If you experience chest pain, shortness of breath, left arm numbness, or sustained, more frequent palpitations , do not wait and call 911 right away. We are in a delicate period of work up regarding palpitations and until we are sure that you do not have an underlying arrhythmia, you must be extremely vigilant and cautious.      ZIO XT- Long Term Monitor Instructions   Your provider has requested you wear a ZIO patch monitor for 14 days.  This is a single patch monitor. [employer] supplies one patch monitor per enrollment. Additional stickers are not available. Please do not apply patch if you will be having a Nuclear Stress Test,  Echocardiogram, Cardiac CT, MRI, or Chest Xray during the period you would be wearing the  monitor. The patch cannot be worn during these tests. You cannot remove and re-apply the  ZIO XT patch monitor.  Your ZIO patch monitor will be mailed 3 day USPS to your address on file. It may take 3-5 days  to receive your monitor after you have been enrolled.  Once you have received your monitor, please review the enclosed instructions. Your monitor  has already been registered assigning a specific monitor serial # to you.   Billing and Patient Assistance Program Information   We have supplied [employer] with any of your insurance information on file for billing purposes. [employer]  offers a sliding scale Patient Assistance Program for patients that do not have  insurance, or whose insurance does not completely cover the cost of the ZIO monitor.  You must apply for the Patient Assistance Program to qualify for this discounted rate.  To apply, please call [employer] at [phone], select option 4, select option 2, ask to apply for  Patient Assistance Program. Meredeth will ask your household income, and how many people  are in your household. They will quote your out-of-pocket cost based on that information.  [employer] will also be able to set up a 72-month, interest-free payment plan if needed.   Applying the monitor   Shave hair from upper left chest.  Hold abrader disc by orange tab. Rub abrader in 40 strokes over the upper left chest as  indicated in your monitor instructions.  Clean area with 4 enclosed alcohol pads. Let dry.  Apply patch as indicated in monitor instructions. Patch will be placed under collarbone on left  side of chest with arrow pointing upward.  Rub patch adhesive wings for 2 minutes. Remove white label marked 1. Remove the white  label marked 2. Rub patch adhesive wings for 2 additional minutes.  While looking in a mirror, press and release button in center of patch. A small green light will  flash 3-4 times. This will be your only indicator that the monitor has been turned on.  Do not  shower for the first 24 hours. You may shower after the first 24 hours.  Press the button if you feel a symptom. You will hear a small click. Record Date, Time and  Symptom in the Patient Logbook.  When you are ready to remove the patch, follow instructions on the last 2 pages of Patient  Logbook. Stick patch monitor onto the last page of Patient Logbook.  Place Patient Logbook in the blue and white box. Use locking tab on box and tape box closed  securely. The blue and white box has prepaid postage on it. Please place it in the mailbox as  soon as possible. Your  physician should have your test results approximately 7 days after the  monitor has been mailed back to [facility].  Call [facility] Customer Care at [phone] if you have questions regarding  your ZIO XT patch monitor. Call them immediately if you see an orange light blinking on your  monitor.  If your monitor falls off in less than 4 days, contact our Monitor department at [phone].  If your monitor becomes loose or falls off after 4 days call [employer] at [phone] for  suggestions on securing your monitor    Health Maintenance, Female Adopting a healthy lifestyle and getting preventive care are important in promoting health and wellness. Ask your health care provider about: The right schedule for you to have regular tests and exams. Things you can do on your own to prevent diseases and keep yourself healthy. What should I know about diet, weight, and exercise? Eat a healthy diet  Eat a diet that includes plenty of vegetables, fruits, low-fat dairy products, and lean protein. Do not eat a lot of foods that are high in solid fats, added sugars, or sodium. Maintain a healthy weight Body mass index (BMI) is used to identify weight problems. It estimates body fat based on height and weight. Your health care provider can help determine your BMI and help you achieve or maintain a healthy weight. Get regular exercise Get regular exercise. This is one of the most important things you can do for your health. Most adults should: Exercise for at least 150 minutes each week. The exercise should increase your heart rate and make you sweat (moderate-intensity exercise). Do strengthening exercises at least twice a week. This is in addition to the moderate-intensity exercise. Spend less time sitting. Even light physical activity can be beneficial. Watch cholesterol and blood lipids Have your blood tested for lipids and cholesterol at 32 years of age, then have this test every 5  years. Have your cholesterol levels checked more often if: Your lipid or cholesterol levels are high. You are older than 32 years of age. You are at high risk for heart disease. What should I know about cancer screening? Depending on your health history and family history, you may need to have cancer screening at various ages. This may include screening for: Breast cancer. Cervical cancer. Colorectal cancer. Skin cancer. Lung cancer. What should I know about heart disease, diabetes, and high blood pressure? Blood pressure and heart disease High blood pressure causes heart disease and increases the risk of stroke. This is more likely to develop in people who have high blood pressure readings or are overweight. Have your blood pressure checked: Every 3-5 years if you are 53-73 years of age. Every year if you are 69 years old or older. Diabetes Have regular diabetes screenings. This checks your fasting blood sugar level. Have the screening done: Once  every three years after age 79 if you are at a normal weight and have a low risk for diabetes. More often and at a younger age if you are overweight or have a high risk for diabetes. What should I know about preventing infection? Hepatitis B If you have a higher risk for hepatitis B, you should be screened for this virus. Talk with your health care provider to find out if you are at risk for hepatitis B infection. Hepatitis C Testing is recommended for: Everyone born from [birth year] through 1964. Anyone with known risk factors for hepatitis C. Sexually transmitted infections (STIs) Get screened for STIs, including gonorrhea and chlamydia, if: You are sexually active and are younger than 32 years of age. You are older than 32 years of age and your health care provider tells you that you are at risk for this type of infection. Your sexual activity has changed since you were last screened, and you are at increased risk for chlamydia or gonorrhea.  Ask your health care provider if you are at risk. Ask your health care provider about whether you are at high risk for HIV. Your health care provider may recommend a prescription medicine to help prevent HIV infection. If you choose to take medicine to prevent HIV, you should first get tested for HIV. You should then be tested every 3 months for as long as you are taking the medicine. Pregnancy If you are about to stop having your period (premenopausal) and you may become pregnant, seek counseling before you get pregnant. Take 400 to 800 micrograms (mcg) of folic acid  every day if you become pregnant. Ask for birth control (contraception) if you want to prevent pregnancy. Osteoporosis and menopause Osteoporosis is a disease in which the bones lose minerals and strength with aging. This can result in bone fractures. If you are 27 years old or older, or if you are at risk for osteoporosis and fractures, ask your health care provider if you should: Be screened for bone loss. Take a calcium or vitamin D  supplement to lower your risk of fractures. Be given hormone replacement therapy (HRT) to treat symptoms of menopause. Follow these instructions at home: Alcohol use Do not drink alcohol if: Your health care provider tells you not to drink. You are pregnant, may be pregnant, or are planning to become pregnant. If you drink alcohol: Limit how much you have to: 0-1 drink a day. Know how much alcohol is in your drink. In the [place] one drink equals one 12 oz bottle of beer (355 mL), one 5 oz glass of wine (148 mL), or one 1 oz glass of hard liquor (44 mL). Lifestyle Do not use any products that contain nicotine or tobacco. These products include cigarettes, chewing tobacco, and vaping devices, such as e-cigarettes. If you need help quitting, ask your health care provider. Do not use street drugs. Do not share needles. Ask your health care provider for help if you need support or information about  quitting drugs. General instructions Schedule regular health, dental, and eye exams. Stay current with your vaccines. Tell your health care provider if: You often feel depressed. You have ever been abused or do not feel safe at home. Summary Adopting a healthy lifestyle and getting preventive care are important in promoting health and wellness. Follow your health care provider's instructions about healthy diet, exercising, and getting tested or screened for diseases. Follow your health care provider's instructions on monitoring your cholesterol and blood pressure. This information is  not intended to replace advice given to you by your health care provider. Make sure you discuss any questions you have with your health care provider. Document Revised: 10/23/2020 Document Reviewed: 10/23/2020 Elsevier Patient Education  2024 [employer].

## 2024-03-28 ENCOUNTER — Other Ambulatory Visit: Payer: Self-pay | Admitting: Family

## 2024-03-28 DIAGNOSIS — S39012A Strain of muscle, fascia and tendon of lower back, initial encounter: Secondary | ICD-10-CM

## 2024-03-29 ENCOUNTER — Ambulatory Visit: Payer: Self-pay

## 2024-03-29 LAB — INSULIN, RANDOM: Insulin: 10.1 u[IU]/mL

## 2024-03-29 NOTE — Telephone Encounter (Signed)
 FYI Only or Action Required?: Action required by provider: request for appointment.: please call pt  Patient was last seen in primary care on 03/26/2024 by Dineen Rollene MATSU, FNP.  Called Nurse Triage reporting Blood Sugar Problem.  Symptoms began today.  Interventions attempted: Other: eating a meal and drinking orange juice.  Symptoms are: stable.  Triage Disposition: Call PCP Within 24 Hours  Patient/caregiver understands and will follow disposition?: No, refuses disposition    Copied from CRM #[id number]. Topic: Clinical - Red Word Triage >> Mar 29, 2024  1:20 PM Susan Adkins wrote: [facility] that prompted transfer to Nurse Triage: Pt saw PCP on 03/26/24 and states that over the weekend and today, glucose is very low. After eating 140-150 and then 2 hours later 70 or below. Last night alarms woke her up saying blood sugars were 54. Reason for Disposition  [1] Blood glucose 70 mg/dL (3.9 mmol/L) or below OR symptomatic, now improved with Care Advice AND [2] cause unknown  Answer Assessment - Initial Assessment Questions 1. SYMPTOMS: What symptoms are you concerned about?     Need able to think clear, sweaty palms, stuttering 2. ONSET:  When did the symptoms start?     today 3. BLOOD GLUCOSE: What is your blood glucose level?      69 currently - had pt recheck FSBS now it is 71 4. USUAL RANGE: What is your blood glucose level usually? (e.g., usual fasting morning value, usual evening value)     na 5. TYPE 1 or 2:  Do you know what type of diabetes you have?  (e.g., Type 1, Type 2, Gestational; doesn't know)      na 6. INSULIN: Do you take insulin? What type of insulin(s) do you use? What is the mode of delivery? (syringe, pen; injection or pump) When did you last give yourself an insulin dose? (i.e., time or hours/minutes ago) How much did you give? (i.e., how many units)     na 7. DIABETES PILLS: Do you take any pills for your diabetes? If Yes, ask: What is the  name of the medicine(s) that you take for high blood sugar?     na 8. OTHER SYMPTOMS: Do you have any symptoms? (e.g., fever, frequent urination, difficulty breathing, vomiting)     no 9. LOW BLOOD GLUCOSE TREATMENT: What have you done so far to treat the low blood glucose level?     2 hours After meal and has been eating a snack before bed 10. FOOD: When did you last eat or drink?       Orange juice 11. ALONE: Are you alone right now or is someone with you?        no 12. PREGNANCY: Is there any chance you are pregnant? When was your last menstrual period?       Na   over the weekend and today, glucose is very low. After eating 140-150 and then 2 hours later 70 or below. Last night alarms woke her up saying blood sugars were 54. Offered pt appt to come in today to see a different provider: pt refused stating only wants to see PCP - routing information to PCP for review.  Protocols used: Diabetes - Low Blood Sugar-A-AH

## 2024-03-29 NOTE — Telephone Encounter (Signed)
 Spoke to pt scheduled her to see provider on 03/30/24

## 2024-03-30 ENCOUNTER — Ambulatory Visit: Admitting: Family

## 2024-03-30 ENCOUNTER — Encounter: Payer: Self-pay | Admitting: Family

## 2024-03-30 VITALS — BP 130/78 | HR 78 | Temp 97.7°F | Ht 64.0 in | Wt 174.6 lb

## 2024-03-30 DIAGNOSIS — E063 Autoimmune thyroiditis: Secondary | ICD-10-CM

## 2024-03-30 DIAGNOSIS — E162 Hypoglycemia, unspecified: Secondary | ICD-10-CM

## 2024-03-30 MED ORDER — DEXCOM G7 SENSOR MISC: 2 refills | Status: DC

## 2024-03-30 MED ORDER — CONTINUOUS GLUCOSE MONITOR SUP MISC: 0 refills | Status: AC

## 2024-03-30 NOTE — Assessment & Plan Note (Signed)
 CGM showing 85 and at same time glucometer obtained glucose  of 110 while in the office. Reassuring patient regarding absence of prediabetes, insulin resistance. Still pending PCOS panel.  Reviewed CGM data in detail and physiologic hypoglycemic episodes as well as symptoms of hypoglycemia. We discussed increase of protein to carb ratio with small frequent meals to sustain blood sugar. She politely declines nutrition consult at this time. Given another sample of CGM while we await to see if insurance covers Dexcom. Provided glucometer as well.

## 2024-03-30 NOTE — Progress Notes (Unsigned)
 Assessment & Plan:  Hashimoto's thyroiditis -     TSH; Future  Hypoglycemia Assessment & Plan: CGM showing 85 and at same time glucometer obtained glucose  of 110 while in the office. Reassuring patient regarding absence of prediabetes, insulin resistance. Still pending PCOS panel.  Reviewed CGM data in detail and physiologic hypoglycemic episodes as well as symptoms of hypoglycemia. We discussed increase of protein to carb ratio with small frequent meals to sustain blood sugar. She politely declines nutrition consult at this time. Given another sample of CGM while we await to see if insurance covers Dexcom. Provided glucometer as well.    Other orders -     Continuous Glucose Monitor Sup; Checking sugars daily  Dispense: 1 each; Refill: 0 -     Dexcom G7 Sensor; Monitoring sugars daily  Dispense: 4 each; Refill: 2     Return precautions given.   Risks, benefits, and alternatives of the medications and treatment plan prescribed today were discussed, and patient expressed understanding.   Education regarding symptom management and diagnosis given to patient on AVS either electronically or printed.  No follow-ups on file.  Susan Northern, Susan Adkins  Subjective:    Patient ID: Susan Adkins, female    DOB: 07-11-1990, 33 y.o.   MRN: [id number]  CC: Susan Adkins is a 33 y.o. female who presents today for follow up.   HPI: HPI Discussed the use of AI scribe software for clinical note transcription with the patient, who gave verbal consent to proceed.  History of Present Illness   Susan Adkins is a 33 year old female with Ehlers-Danlos syndrome who presents with episodes of sweating, racing heart, and cognitive fog.  Accompanied by her children today.  She experiences episodes of sweating, a racing heart, and cognitive fog, which she associates with rapid changes in blood sugar levels. Her blood sugar often drops below 70 mg/dL, with a recent low of 53 mg/dL occurring in the  morning before eating.  Her typical diet includes a breakfast of banana, peanut butter, and coffee with oat milk. She notes that coffee on an empty stomach can cause her blood sugar to spike from 70 to 165 mg/dL. She has experimented with different dietary patterns, finding that last weekend after consuming fast food makes her feel better compared to her usual diet. She has tried eating more frequently and balancing carbohydrates with fats and proteins.  She experiences symptoms of hypoglycemia such as feeling 'body heavy and tired' when her blood sugar is around 75 mg/dL or lower. She often feels shaky and sweats profusely during these episodes. She has learned to recognize these symptoms.  She uses a continuous glucose monitor (CGM) to track her blood sugar levels and finds it helpful in recognizing and addressing her hypoglycemic episodes.  She has a history of Ehlers-Danlos syndrome.       Allergies: Codeine , Robaxin  [methocarbamol ], and Wellbutrin  [bupropion ] Current Outpatient Medications on File Prior to Visit  Medication Sig Dispense Refill   Cyanocobalamin  (VITAMIN B 12 PO) Take 1 capsule by mouth daily.     cyclobenzaprine  (FLEXERIL ) 10 MG tablet Take 1 tablet (10 mg total) by mouth at bedtime as needed for muscle spasms. 30 tablet 2   eszopiclone  (LUNESTA ) 2 MG TABS tablet TAKE 1 TABLET (2 MG TOTAL) BY MOUTH IMMEDIATELY BEFORE BEDTIME AS NEEDED FOR SLEEP 30 tablet 2   gabapentin  (NEURONTIN ) 300 MG capsule TAKE 2 CAPSULES BY MOUTH AT BEDTIME 60 capsule 0   Levonorgestrel-Ethinyl Estradiol  (  AMETHIA) 0.1-0.02 & 0.01 MG tablet Take 1 tablet by mouth daily. 91 tablet 4   levothyroxine  (SYNTHROID ) 150 MCG tablet TAKE 1 TABLET BY MOUTH EVERY DAY 90 tablet 3   meloxicam  (MOBIC ) 15 MG tablet Take 15 mg by mouth daily.     ondansetron  (ZOFRAN -ODT) 4 MG disintegrating tablet Take 1 tablet (4 mg total) by mouth daily as needed for nausea or vomiting. 30 tablet 1   propranolol  ER (INDERAL  LA) 80  MG 24 hr capsule TAKE 1 CAPSULE BY MOUTH EVERY DAY 90 capsule 3   venlafaxine  XR (EFFEXOR -XR) 75 MG 24 hr capsule Take 75 mg by mouth every morning.     No current facility-administered medications on file prior to visit.    Review of Systems  Constitutional:  Negative for chills and fever.  Respiratory:  Negative for cough.   Cardiovascular:  Negative for chest pain and palpitations.  Gastrointestinal:  Negative for nausea and vomiting.      Objective:    BP 130/78   Pulse 78   Temp 97.7 F (36.5 C) (Oral)   Ht 5' 4 (1.626 m)   Wt 174 lb 9.6 oz (79.2 kg)   LMP  (LMP Unknown)   SpO2 97%   BMI 29.97 kg/m  BP Readings from Last 3 Encounters:  03/30/24 130/78  03/26/24 124/84  02/10/24 130/76   Wt Readings from Last 3 Encounters:  03/30/24 174 lb 9.6 oz (79.2 kg)  03/26/24 173 lb 9.6 oz (78.7 kg)  02/10/24 177 lb 9.6 oz (80.6 kg)    Physical Exam Vitals reviewed.  Constitutional:      Appearance: She is well-developed.  Eyes:     Conjunctiva/sclera: Conjunctivae normal.  Cardiovascular:     Rate and Rhythm: Normal rate and regular rhythm.     Pulses: Normal pulses.     Heart sounds: Normal heart sounds.  Pulmonary:     Effort: Pulmonary effort is normal.     Breath sounds: Normal breath sounds. No wheezing, rhonchi or rales.  Skin:    General: Skin is warm and dry.  Neurological:     Mental Status: She is alert.  Psychiatric:        Speech: Speech normal.        Behavior: Behavior normal.        Thought Content: Thought content normal.

## 2024-03-31 ENCOUNTER — Other Ambulatory Visit: Payer: Self-pay

## 2024-03-31 ENCOUNTER — Encounter: Payer: Self-pay | Admitting: Family

## 2024-03-31 DIAGNOSIS — E119 Type 2 diabetes mellitus without complications: Secondary | ICD-10-CM

## 2024-03-31 MED ORDER — BLOOD GLUCOSE MONITORING SUPPL W/DEVICE KIT: PACK | 0 refills | Status: AC

## 2024-04-01 ENCOUNTER — Ambulatory Visit: Payer: Self-pay | Admitting: Family

## 2024-04-01 NOTE — Assessment & Plan Note (Signed)
 Deferred clinical breast exam due to patient preference.  Encouraged more formal exercise program.  Deferred pelvic exam in the absence of complaints and Pap smear is up-to-date.

## 2024-04-01 NOTE — Assessment & Plan Note (Addendum)
 Chronic, episodic.  She is also having symptoms of hypoglycemia.  Pending zio monitor.  Continue propranolol  80 mg daily for now for management of migraine/anxiety.

## 2024-04-01 NOTE — Assessment & Plan Note (Signed)
 Patient consistent with symptomatic hypoglycemia.  Given patient sample of libre CGM monitor.  Discussed carbohydrate protein combination and eating every 2-3 hours.  Adequate hydration.  Pending labs

## 2024-04-02 ENCOUNTER — Encounter: Payer: Self-pay | Admitting: Family

## 2024-04-05 ENCOUNTER — Other Ambulatory Visit: Payer: Self-pay | Admitting: Family

## 2024-04-05 DIAGNOSIS — E162 Hypoglycemia, unspecified: Secondary | ICD-10-CM

## 2024-04-07 DIAGNOSIS — M26603 Bilateral temporomandibular joint disorder, unspecified: Secondary | ICD-10-CM | POA: Diagnosis not present

## 2024-04-07 DIAGNOSIS — R519 Headache, unspecified: Secondary | ICD-10-CM | POA: Diagnosis not present

## 2024-04-07 DIAGNOSIS — M62838 Other muscle spasm: Secondary | ICD-10-CM | POA: Diagnosis not present

## 2024-04-08 ENCOUNTER — Encounter: Payer: Self-pay | Admitting: Family

## 2024-04-08 NOTE — Telephone Encounter (Signed)
 Noted

## 2024-04-10 LAB — PCOS DIAGNOSTIC PROFILE
17-Alpha-Hydroxyprogesterone: <10 ng/dL
ANTI-MULLERIAN HORMONE (AMH): 7.15 ng/mL
DHEA-Sulfate, LCMS: 108 ug/dL
Estradiol, Serum, MS: 3.3 pg/mL
Follicle Stimulating Hormone: 3.6 m[IU]/mL
Free Testosterone, Serum: 7.3 pg/mL — ABNORMAL HIGH
Luteinizing Hormone (LH) ECL: 2 m[IU]/mL
Prolactin: 8.8 ng/mL
Sex Hormone Binding Globulin: 68.7 nmol/L
TSH: 0.03 uU/mL — ABNORMAL LOW
Testosterone, Serum (Total): 66 ng/dL — ABNORMAL HIGH
Testosterone-% Free: 1.1 %

## 2024-04-12 ENCOUNTER — Other Ambulatory Visit (INDEPENDENT_AMBULATORY_CARE_PROVIDER_SITE_OTHER)

## 2024-04-12 ENCOUNTER — Ambulatory Visit
Admission: RE | Admit: 2024-04-12 | Discharge: 2024-04-12 | Disposition: A | Discharge status: Home / Self Care | Source: Ambulatory Visit | Attending: Family | Admitting: Family

## 2024-04-12 DIAGNOSIS — E162 Hypoglycemia, unspecified: Secondary | ICD-10-CM | POA: Diagnosis not present

## 2024-04-12 DIAGNOSIS — E063 Autoimmune thyroiditis: Secondary | ICD-10-CM

## 2024-04-12 DIAGNOSIS — N6320 Unspecified lump in the left breast, unspecified quadrant: Secondary | ICD-10-CM | POA: Insufficient documentation

## 2024-04-12 DIAGNOSIS — R928 Other abnormal and inconclusive findings on diagnostic imaging of breast: Secondary | ICD-10-CM | POA: Diagnosis not present

## 2024-04-12 LAB — TSH: TSH: 0.04 u[IU]/mL — ABNORMAL LOW (ref 0.35–5.50)

## 2024-04-13 ENCOUNTER — Ambulatory Visit: Payer: Self-pay | Admitting: Family

## 2024-04-13 DIAGNOSIS — N6320 Unspecified lump in the left breast, unspecified quadrant: Secondary | ICD-10-CM

## 2024-04-13 LAB — CORTISOL-AM, BLOOD: Cortisol - AM: 18.1 ug/dL

## 2024-04-14 ENCOUNTER — Ambulatory Visit: Payer: Self-pay

## 2024-04-14 NOTE — Telephone Encounter (Signed)
 Spoke to pt she sent my chart message to provider questions have been answered

## 2024-04-14 NOTE — Telephone Encounter (Signed)
 Patient able to reach staff via mychart, no further question or triage needed  Copied from CRM #[id number]. Topic: Clinical - Lab/Test Results >> Apr 14, 2024 12:22 PM Susan Adkins wrote: Patient calling because she is concerned about her lab results. She was trying to message the Dr back but rhona is giving her problems. She is requesting for someone to give her a call. Reason for Disposition  Health information question, no triage required and triager able to answer question  Answer Assessment - Initial Assessment Questions 1. REASON FOR CALL: What is the main reason for your call? or How can I best help you? Patient able to reach office, no triage needed  Protocols used: Information Only Call - No Triage-A-AH

## 2024-04-15 ENCOUNTER — Ambulatory Visit: Admitting: Family

## 2024-04-15 ENCOUNTER — Telehealth (INDEPENDENT_AMBULATORY_CARE_PROVIDER_SITE_OTHER): Admitting: Family

## 2024-04-15 ENCOUNTER — Encounter: Payer: Self-pay | Admitting: Family

## 2024-04-15 VITALS — BP 106/80 | Ht 64.0 in | Wt 172.0 lb

## 2024-04-15 DIAGNOSIS — R739 Hyperglycemia, unspecified: Secondary | ICD-10-CM

## 2024-04-15 DIAGNOSIS — E063 Autoimmune thyroiditis: Secondary | ICD-10-CM

## 2024-04-15 DIAGNOSIS — E890 Postprocedural hypothyroidism: Secondary | ICD-10-CM | POA: Diagnosis not present

## 2024-04-15 DIAGNOSIS — E049 Nontoxic goiter, unspecified: Secondary | ICD-10-CM

## 2024-04-15 MED ORDER — METFORMIN HCL ER 500 MG PO TB24: 500.0000 mg | ORAL_TABLET | Freq: Every evening | ORAL | 2 refills | Status: DC

## 2024-04-15 MED ORDER — LEVOTHYROXINE SODIUM 137 MCG PO TABS: 137.0000 ug | ORAL_TABLET | Freq: Every day | ORAL | 3 refills | Status: DC

## 2024-04-15 NOTE — Progress Notes (Signed)
 Virtual Visit via Video Note  I connected with Susan Adkins on 04/19/24 at 11:30 AM EDT by a video enabled telemedicine application and verified that I am speaking with the correct person using two identifiers. Location patient: home Location provider: work  Persons participating in the virtual visit: patient, provider  I discussed the limitations of evaluation and management by telemedicine and the availability of in person appointments. The patient expressed understanding and agreed to proceed.  HPI:  Appointment to discuss labs, discuss elevated testosterone  She prefers not to go to endocrinology at this time.  She interested in decreasing her dose of Synthroid   She is interested in meformin. She is aware of rapid changes of glucose which affect her mood and can cause 'crashes' Crashes have improved with modification of protein.   She continues to use [place] .She has noticed discrepancy with glucometer and Libre.  FBG average 106  Family h/o DM. She is worried about risk of DM. She is interested in weight loss.    ROS: See pertinent positives and negatives per HPI.  EXAM:  VITALS per patient if applicable: BP 106/80 Comment: per pt  Ht 5' 4 (1.626 m)   Wt 172 lb (78 kg)   LMP  (LMP Unknown)   BMI 29.52 kg/m  BP Readings from Last 3 Encounters:  04/15/24 106/80  03/30/24 130/78  03/26/24 124/84   Wt Readings from Last 3 Encounters:  04/15/24 172 lb (78 kg)  03/30/24 174 lb 9.6 oz (79.2 kg)  03/26/24 173 lb 9.6 oz (78.7 kg)    GENERAL: alert, oriented, appears well and in no acute distress  HEENT: atraumatic, conjunttiva clear, no obvious abnormalities on inspection of external nose and ears  NECK: normal movements of the head and neck  LUNGS: on inspection no signs of respiratory distress, breathing rate appears normal, no obvious gross SOB, gasping or wheezing  CV: no obvious cyanosis  MS: moves all visible extremities without noticeable  abnormality  PSYCH/NEURO: pleasant and cooperative, no obvious depression or anxiety, speech and thought processing grossly intact  ASSESSMENT AND PLAN: Hyperglycemia Assessment & Plan: Start metformin 500 mg daily, titrate for additional benefit of weight loss.  Continue wearing CGM  Orders: -     metFORMIN HCl ER; Take 1 tablet (500 mg total) by mouth every evening.  Dispense: 90 tablet; Refill: 2  Enlarged thyroid  -     TSH; Future  Hashimoto's thyroiditis Assessment & Plan: She politely declines return to follow up to endocrine. Decrease Synthroid  137 mcg. Repeat TSH in 6 weeks.   Orders: -     Levothyroxine  Sodium; Take 1 tablet (137 mcg total) by mouth daily before breakfast.  Dispense: 90 tablet; Refill: 3  Postoperative hypothyroidism Assessment & Plan: She politely declines return to follow up to endocrine. Decrease Synthroid  137 mcg. Repeat TSH in 6 weeks.       -we discussed possible serious and likely etiologies, options for evaluation and workup, limitations of telemedicine visit vs in person visit, treatment, treatment risks and precautions. Pt prefers to treat via telemedicine empirically rather then risking or undertaking an in person visit at this moment.    I discussed the assessment and treatment plan with the patient. The patient was provided an opportunity to ask questions and all were answered. The patient agreed with the plan and demonstrated an understanding of the instructions.   The patient was advised to call back or seek an in-person evaluation if the symptoms worsen or if the  condition fails to improve as anticipated.  Advised if desired AVS can be mailed or viewed via MyChart if Mychart user.   Rollene Northern, FNP

## 2024-04-15 NOTE — Patient Instructions (Addendum)
 Decrease Synthroid  to 137 mcg.  Please call the office schedule repeat TSH lab in 6 weeks time   metformin is used in prediabetes, diabetes, and also for weight loss by decreasing calorie consumption.   It works in a couple of ways by decreasing liver glucose production, decreases intestinal absorption of glucose and improves insulin sensitivity (increases peripheral glucose uptake and utilization).     Please make sure that you titrate per below so not to cause any GI upset.    Start metformin XR with one 500mg  tablet at night. After one week, you may increase to two tablets at night ( total of 1000mg ) . The third week, you may take take two tablets at night and one tablet in the morning.  The fourth week, you may take two tablets in the morning ( 1000mg  total) and two tablets at night (1000mg  total). This will bring you to a maximum daily dose of 2000mg /day which is maximum dose.  So you are aware,  you may take ALL 4 tablets of metformin together at the same time if preferable and doesn't cause GI upset. You may take metformin 2000mg  ( four of the 500mg  tablets) together in the morning or at night if you prefer.   Along the way, if you want to increase more slowly, please do as this medication can cause GI discomfort and loose stools which usually get better with time , however some patients find that they can only tolerate a certain dose and cannot increase to maximum dose.

## 2024-04-15 NOTE — Assessment & Plan Note (Addendum)
 She politely declines return to follow up to endocrine. Decrease Synthroid  137 mcg. Repeat TSH in 6 weeks.

## 2024-04-17 DIAGNOSIS — R002 Palpitations: Secondary | ICD-10-CM | POA: Diagnosis not present

## 2024-04-19 NOTE — Assessment & Plan Note (Signed)
 Start metformin 500 mg daily, titrate for additional benefit of weight loss.  Continue wearing CGM

## 2024-04-21 ENCOUNTER — Ambulatory Visit: Admitting: Family

## 2024-04-26 ENCOUNTER — Other Ambulatory Visit: Payer: Self-pay | Admitting: Family

## 2024-04-26 DIAGNOSIS — S39012A Strain of muscle, fascia and tendon of lower back, initial encounter: Secondary | ICD-10-CM

## 2024-04-29 ENCOUNTER — Other Ambulatory Visit: Payer: Self-pay | Admitting: Family

## 2024-04-29 ENCOUNTER — Encounter: Payer: Self-pay | Admitting: Family

## 2024-04-29 DIAGNOSIS — R739 Hyperglycemia, unspecified: Secondary | ICD-10-CM

## 2024-04-29 MED ORDER — METFORMIN HCL ER 500 MG PO TB24: 2000.0000 mg | ORAL_TABLET | Freq: Every evening | ORAL | 3 refills | Status: DC

## 2024-04-30 DIAGNOSIS — M62838 Other muscle spasm: Secondary | ICD-10-CM | POA: Diagnosis not present

## 2024-04-30 DIAGNOSIS — R519 Headache, unspecified: Secondary | ICD-10-CM | POA: Diagnosis not present

## 2024-04-30 DIAGNOSIS — M26603 Bilateral temporomandibular joint disorder, unspecified: Secondary | ICD-10-CM | POA: Diagnosis not present

## 2024-05-03 ENCOUNTER — Ambulatory Visit: Admitting: Family

## 2024-05-20 ENCOUNTER — Other Ambulatory Visit: Payer: Self-pay | Admitting: Family

## 2024-05-25 ENCOUNTER — Other Ambulatory Visit: Payer: Self-pay | Admitting: Family

## 2024-05-25 DIAGNOSIS — S39012A Strain of muscle, fascia and tendon of lower back, initial encounter: Secondary | ICD-10-CM

## 2024-05-30 ENCOUNTER — Other Ambulatory Visit: Payer: Self-pay | Admitting: Family

## 2024-06-01 ENCOUNTER — Ambulatory Visit: Admitting: Family

## 2024-06-03 ENCOUNTER — Ambulatory Visit: Payer: Self-pay

## 2024-06-03 NOTE — Telephone Encounter (Signed)
 Noted

## 2024-06-03 NOTE — Telephone Encounter (Signed)
 Pt scheduled with bair on the 23rd FYI

## 2024-06-03 NOTE — Telephone Encounter (Signed)
 FYI Only or Action Required?: FYI only for provider: appointment scheduled on 06/08/24.  Patient was last seen in primary care on 04/15/2024 by Dineen Rollene MATSU, FNP.  Called Nurse Triage reporting Vaginal Discharge.  Symptoms began a week ago.  Interventions attempted: Nothing.  Symptoms are: gradually worsening.  Triage Disposition: See PCP When Office is Open (Within 3 Days)  Patient/caregiver understands and will follow disposition?: Yes, but will wait   Copied from CRM #[id number]. Topic: Clinical - Red Word Triage >> Jun 03, 2024  3:11 PM Emylou G wrote: [facility] that prompted transfer to Nurse Triage: Burns in her vaginal area 24/7 - slight watery discharge Reason for Disposition  Bad smelling vaginal discharge    Slight odor  Answer Assessment - Initial Assessment Questions Additional info: Patient requesting appointment in office next week, scheduled. Discussed reason to seek sooner evaluation at [facility].   1. DISCHARGE: Describe the discharge. (e.g., white, yellow, green, gray, foamy, cottage cheese-like)     Thin clear watery discharge started last week 2. ODOR: Is there a bad odor?     Slight odor 3. ONSET: When did the discharge begin?     One week ago discharge, discomfort started today 4. RASH: Is there a rash in the genital area? If Yes, ask: Describe it. (e.g., redness, blisters, sores, bumps)     denies 5. ABDOMEN PAIN: Are you having any abdomen pain? If Yes, ask: What does it feel like?  (e.g., crampy, dull, intermittent, constant)      Denies 6. ABDOMEN PAIN SEVERITY: If present, ask: How bad is it? (e.g., Scale 1-10; mild, moderate, or severe)     0 7. CAUSE: What do you think is causing the discharge? Have you had the same problem before? What happened then?     Unsure, does not feel like a UTI, feels similar to bacterial vaginosis she had in the past  8. OTHER SYMPTOMS: Do you have any other symptoms? (e.g., fever, itching,  urination pain, vaginal bleeding, vaginal foreign body)     Mild external vaginal burning sensation-constant. Denies fever, rashes  and all other symptoms.  9. PREGNANCY: Is there any chance you are pregnant? When was your last menstrual period?     Denies  Protocols used: Vaginal Discharge-A-AH

## 2024-06-08 ENCOUNTER — Ambulatory Visit

## 2024-06-15 ENCOUNTER — Ambulatory Visit (INDEPENDENT_AMBULATORY_CARE_PROVIDER_SITE_OTHER): Admitting: Family

## 2024-06-15 ENCOUNTER — Other Ambulatory Visit (HOSPITAL_COMMUNITY)
Admission: RE | Admit: 2024-06-15 | Discharge: 2024-06-15 | Disposition: A | Discharge status: Home / Self Care | Source: Ambulatory Visit | Attending: Family | Admitting: Family

## 2024-06-15 ENCOUNTER — Encounter: Payer: Self-pay | Admitting: Family

## 2024-06-15 ENCOUNTER — Ambulatory Visit: Admitting: Family

## 2024-06-15 VITALS — BP 110/78 | HR 76 | Temp 98.5°F | Ht 62.0 in | Wt 168.4 lb

## 2024-06-15 DIAGNOSIS — F32A Depression, unspecified: Secondary | ICD-10-CM | POA: Diagnosis not present

## 2024-06-15 DIAGNOSIS — N949 Unspecified condition associated with female genital organs and menstrual cycle: Secondary | ICD-10-CM | POA: Insufficient documentation

## 2024-06-15 DIAGNOSIS — S39012A Strain of muscle, fascia and tendon of lower back, initial encounter: Secondary | ICD-10-CM

## 2024-06-15 DIAGNOSIS — E063 Autoimmune thyroiditis: Secondary | ICD-10-CM

## 2024-06-15 DIAGNOSIS — F419 Anxiety disorder, unspecified: Secondary | ICD-10-CM | POA: Diagnosis not present

## 2024-06-15 DIAGNOSIS — M26629 Arthralgia of temporomandibular joint, unspecified side: Secondary | ICD-10-CM

## 2024-06-15 DIAGNOSIS — G43709 Chronic migraine without aura, not intractable, without status migrainosus: Secondary | ICD-10-CM

## 2024-06-15 DIAGNOSIS — R739 Hyperglycemia, unspecified: Secondary | ICD-10-CM | POA: Diagnosis not present

## 2024-06-15 DIAGNOSIS — G4700 Insomnia, unspecified: Secondary | ICD-10-CM | POA: Diagnosis not present

## 2024-06-15 DIAGNOSIS — M549 Dorsalgia, unspecified: Secondary | ICD-10-CM | POA: Diagnosis not present

## 2024-06-15 LAB — TSH: TSH: 0.46 u[IU]/mL (ref 0.35–5.50)

## 2024-06-15 LAB — CERVICOVAGINAL ANCILLARY ONLY
Bacterial Vaginitis (gardnerella): NEGATIVE
Candida Glabrata: NEGATIVE
Candida Vaginitis: NEGATIVE
Comment: NEGATIVE
Comment: NEGATIVE
Comment: NEGATIVE

## 2024-06-15 MED ORDER — GABAPENTIN 300 MG PO CAPS: 600.0000 mg | ORAL_CAPSULE | Freq: Every day | ORAL | 3 refills | Status: AC

## 2024-06-15 MED ORDER — PROPRANOLOL HCL ER 80 MG PO CP24: ORAL_CAPSULE | ORAL | 3 refills | Status: AC

## 2024-06-15 MED ORDER — CYCLOBENZAPRINE HCL 10 MG PO TABS: 10.0000 mg | ORAL_TABLET | Freq: Every evening | ORAL | 2 refills | Status: AC | PRN

## 2024-06-15 MED ORDER — METFORMIN HCL ER 500 MG PO TB24: 2000.0000 mg | ORAL_TABLET | Freq: Every evening | ORAL | 3 refills | Status: AC

## 2024-06-15 MED ORDER — LEVOTHYROXINE SODIUM 137 MCG PO TABS: 137.0000 ug | ORAL_TABLET | Freq: Every day | ORAL | 3 refills | Status: AC

## 2024-06-15 MED ORDER — MELOXICAM 15 MG PO TABS: 15.0000 mg | ORAL_TABLET | Freq: Every day | ORAL | 1 refills | Status: AC | PRN

## 2024-06-15 MED ORDER — ESZOPICLONE 2 MG PO TABS: ORAL_TABLET | ORAL | 2 refills | Status: DC

## 2024-06-15 MED ORDER — VENLAFAXINE HCL ER 75 MG PO CP24: 75.0000 mg | ORAL_CAPSULE | Freq: Every morning | ORAL | 3 refills | Status: AC

## 2024-06-15 NOTE — Progress Notes (Unsigned)
 "  Assessment & Plan:  Vaginal symptom Assessment & Plan: Exam reassuring.  Wet prep obtained.   Orders: -     Cervicovaginal ancillary only  Strain of muscle, fascia and tendon of lower back, initial encounter -     Gabapentin ; Take 2 capsules (600 mg total) by mouth at bedtime.  Dispense: 180 capsule; Refill: 3  Chronic migraine without aura without status migrainosus, not intractable -     Propranolol  HCl ER; TAKE 1 CAPSULE BY MOUTH EVERY DAY  Dispense: 90 capsule; Refill: 3  Hashimoto's thyroiditis -     TSH -     Levothyroxine  Sodium; Take 1 tablet (137 mcg total) by mouth daily before breakfast.  Dispense: 90 tablet; Refill: 3  Hyperglycemia -     metFORMIN  HCl ER; Take 4 tablets (2,000 mg total) by mouth every evening.  Dispense: 360 tablet; Refill: 3  Insomnia, unspecified type -     Eszopiclone ; Take immediately before bedtimeTAKE 1 TABLET (2 MG TOTAL) BY MOUTH IMMEDIATELY BEFORE BEDTIME AS NEEDED FOR SLEEP  Dispense: 30 tablet; Refill: 2  TMJ pain dysfunction syndrome -     Cyclobenzaprine  HCl; Take 1 tablet (10 mg total) by mouth at bedtime as needed for muscle spasms.  Dispense: 30 tablet; Refill: 2 -     Meloxicam ; Take 1 tablet (15 mg total) by mouth daily as needed for pain.  Dispense: 90 tablet; Refill: 1  Anxiety and depression -     Venlafaxine  HCl ER; Take 1 capsule (75 mg total) by mouth every morning.  Dispense: 90 capsule; Refill: 3  Back pain, unspecified back location, unspecified back pain laterality, unspecified chronicity -     Gabapentin ; Take 2 capsules (600 mg total) by mouth at bedtime.  Dispense: 180 capsule; Refill: 3     Return precautions given.   Risks, benefits, and alternatives of the medications and treatment plan prescribed today were discussed, and patient expressed understanding.   Education regarding symptom management and diagnosis given to patient on AVS either electronically or printed.  Return in about 3 months (around  09/13/2024).  Rollene Northern, FNP  Subjective:    Patient ID: Susan Adkins, female    DOB: May 05, 1991, 33 y.o.   MRN: [id number]  CC: GYNETH HUBKA is a 33 y.o. female who presents today for follow up.   HPI: Complains of intermittent vaginal 'burning' x 2-3 weeks, waxed and waned Watery discharge has resolved No vaginal itching, dysuria, urinary frequency LMP months ago Ho tubal ligation    She requests refills to be sent to new pharmacy, [facility]    Allergies: Codeine , Robaxin  [methocarbamol ], and Wellbutrin  [bupropion ] Medications Ordered Prior to Encounter[1]  Review of Systems  Constitutional:  Negative for chills and fever.  Respiratory:  Negative for cough.   Cardiovascular:  Negative for chest pain and palpitations.  Gastrointestinal:  Negative for nausea and vomiting.  Genitourinary:  Positive for vaginal pain (burning). Negative for dysuria, pelvic pain and vaginal discharge.      Objective:    BP 110/78   Pulse 76   Temp 98.5 F (36.9 C) (Oral)   Ht 5' 2 (1.575 m)   Wt 168 lb 6.4 oz (76.4 kg)   LMP  (LMP Unknown)   SpO2 98%   BMI 30.80 kg/m  BP Readings from Last 3 Encounters:  06/15/24 110/78  04/15/24 106/80  03/30/24 130/78   Wt Readings from Last 3 Encounters:  06/15/24 168 lb 6.4 oz (76.4 kg)  04/15/24 172  lb (78 kg)  03/30/24 174 lb 9.6 oz (79.2 kg)    Physical Exam Vitals reviewed.  Constitutional:      Appearance: She is well-developed.  Eyes:     Conjunctiva/sclera: Conjunctivae normal.  Cardiovascular:     Rate and Rhythm: Normal rate and regular rhythm.     Pulses: Normal pulses.     Heart sounds: Normal heart sounds.  Pulmonary:     Effort: Pulmonary effort is normal.     Breath sounds: Normal breath sounds. No wheezing, rhonchi or rales.  Genitourinary:    Labia:        Right: No rash, tenderness or lesion.        Left: No rash, tenderness or lesion.      Comments: No vulvovaginal erythema. No lesions. Discharge is  thin and clear, not purulent. Wet prep collected Skin:    General: Skin is warm and dry.  Neurological:     Mental Status: She is alert.  Psychiatric:        Speech: Speech normal.        Behavior: Behavior normal.        Thought Content: Thought content normal.            [1]  Current Outpatient Medications on File Prior to Visit  Medication Sig Dispense Refill   ACCU-CHEK GUIDE TEST test strip TEST ONCE A DAY 100 strip 2   Accu-Chek Softclix Lancets lancets EVERY DAY 100 each 2   Blood Glucose Monitoring Suppl w/Device KIT Monitor sugars daily 1 kit 0   Continuous Glucose Monitor Sup MISC Checking sugars daily 1 each 0   Continuous Glucose Sensor (DEXCOM G7 SENSOR) MISC Monitoring sugars daily 4 each 2   Cyanocobalamin  (VITAMIN B 12 PO) Take 1 capsule by mouth daily.     Levonorgestrel-Ethinyl Estradiol  (AMETHIA) 0.1-0.02 & 0.01 MG tablet Take 1 tablet by mouth daily. 91 tablet 4   ondansetron  (ZOFRAN -ODT) 4 MG disintegrating tablet Take 1 tablet (4 mg total) by mouth daily as needed for nausea or vomiting. 30 tablet 1   No current facility-administered medications on file prior to visit.   "

## 2024-06-16 ENCOUNTER — Encounter: Payer: Self-pay | Admitting: Family

## 2024-06-16 ENCOUNTER — Ambulatory Visit: Payer: Self-pay | Admitting: Family

## 2024-06-16 NOTE — Assessment & Plan Note (Signed)
 Exam reassuring.  Wet prep obtained.

## 2024-06-21 DIAGNOSIS — G4700 Insomnia, unspecified: Secondary | ICD-10-CM

## 2024-06-21 MED ORDER — ESZOPICLONE 2 MG PO TABS: ORAL_TABLET | ORAL | 2 refills | Status: AC

## 2024-06-21 MED ORDER — ESZOPICLONE 2 MG PO TABS: ORAL_TABLET | ORAL | 2 refills | Status: DC

## 2024-07-08 ENCOUNTER — Other Ambulatory Visit: Payer: Self-pay | Admitting: Family

## 2024-07-15 ENCOUNTER — Other Ambulatory Visit (HOSPITAL_COMMUNITY): Payer: Self-pay

## 2024-09-13 ENCOUNTER — Ambulatory Visit: Admitting: Family

## 2024-10-12 ENCOUNTER — Other Ambulatory Visit: Payer: Self-pay
